# Patient Record
Sex: Male | Born: 1945 | Race: White | Hispanic: No | Marital: Married | Smoking: Never smoker
Health system: Southern US, Community
[De-identification: ages and names within clinical notes are randomized; demographics above are authoritative.]

## PROBLEM LIST (undated history)

## (undated) DIAGNOSIS — G4730 Sleep apnea, unspecified: Secondary | ICD-10-CM

## (undated) DIAGNOSIS — Z85828 Personal history of other malignant neoplasm of skin: Secondary | ICD-10-CM

## (undated) DIAGNOSIS — R61 Generalized hyperhidrosis: Secondary | ICD-10-CM

## (undated) DIAGNOSIS — R011 Cardiac murmur, unspecified: Secondary | ICD-10-CM

## (undated) DIAGNOSIS — Z87442 Personal history of urinary calculi: Secondary | ICD-10-CM

## (undated) DIAGNOSIS — I10 Essential (primary) hypertension: Secondary | ICD-10-CM

## (undated) DIAGNOSIS — M1990 Unspecified osteoarthritis, unspecified site: Secondary | ICD-10-CM

## (undated) DIAGNOSIS — C801 Malignant (primary) neoplasm, unspecified: Secondary | ICD-10-CM

## (undated) HISTORY — PX: BACK SURGERY: SHX140

## (undated) HISTORY — PX: TONSILLECTOMY: SUR1361

## (undated) HISTORY — PX: EYE SURGERY: SHX253

---

## 1998-05-30 HISTORY — PX: TENDON REPAIR: SHX5111

## 1999-01-03 ENCOUNTER — Ambulatory Visit: Admission: RE | Admit: 1999-01-03 | Discharge: 1999-01-03 | Payer: Self-pay | Admitting: Family Medicine

## 2003-01-10 ENCOUNTER — Encounter: Payer: Self-pay | Admitting: Orthopedic Surgery

## 2003-01-15 ENCOUNTER — Encounter (INDEPENDENT_AMBULATORY_CARE_PROVIDER_SITE_OTHER): Payer: Self-pay | Admitting: Specialist

## 2003-01-15 ENCOUNTER — Encounter: Payer: Self-pay | Admitting: Orthopedic Surgery

## 2003-01-15 ENCOUNTER — Observation Stay (HOSPITAL_COMMUNITY): Admission: RE | Admit: 2003-01-15 | Discharge: 2003-01-16 | Payer: Self-pay | Admitting: Orthopedic Surgery

## 2006-05-30 HISTORY — PX: WRIST SURGERY: SHX841

## 2007-11-01 ENCOUNTER — Encounter (INDEPENDENT_AMBULATORY_CARE_PROVIDER_SITE_OTHER): Payer: Self-pay | Admitting: Orthopedic Surgery

## 2007-11-01 ENCOUNTER — Inpatient Hospital Stay (HOSPITAL_COMMUNITY): Admission: RE | Admit: 2007-11-01 | Discharge: 2007-11-02 | Payer: Self-pay | Admitting: Orthopedic Surgery

## 2007-11-11 ENCOUNTER — Emergency Department (HOSPITAL_COMMUNITY): Admission: EM | Admit: 2007-11-11 | Discharge: 2007-11-11 | Payer: Self-pay | Admitting: Emergency Medicine

## 2009-01-31 IMAGING — CT CT PELVIS W/O CM
2 of 4 series · 17 of 46 positions shown, 19 images · non-contrast
Comparison: Plain films 11/01/2007. Chest films 10/29/2007.

 CT ABDOMEN

11/12/07 – DUPLICATE COPY for exam association in [facility] – No change from original report.
CLINICAL DATA: RIGHT FLANK PAIN. RECENT LUMBAR SPINE SURGERY.

 CT ABDOMEN AND PELVIS WITHOUT CONTRAST (CT UROGRAM)
TECHNIQUE: Contiguous axial images of the abdomen and pelvis
 without oral or intravenous contrast were obtained.

[Series 2: stone_wo 5.0 b40f st · axial · 0.87mm/px · z∈[-330,+54]mm · 14 of 106 slices shown, 16 images]
[im 5/106  soft-tissue]
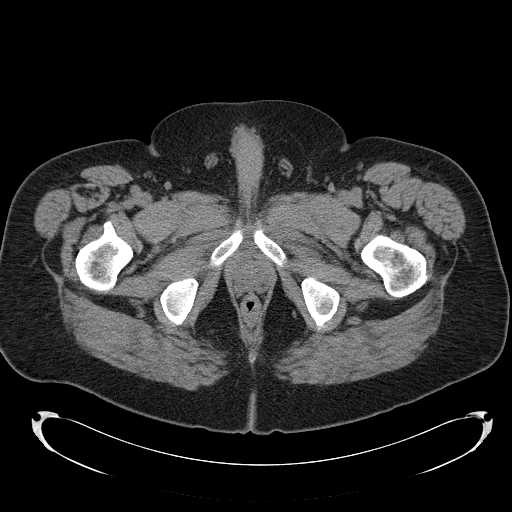
[im 5/106  bone]
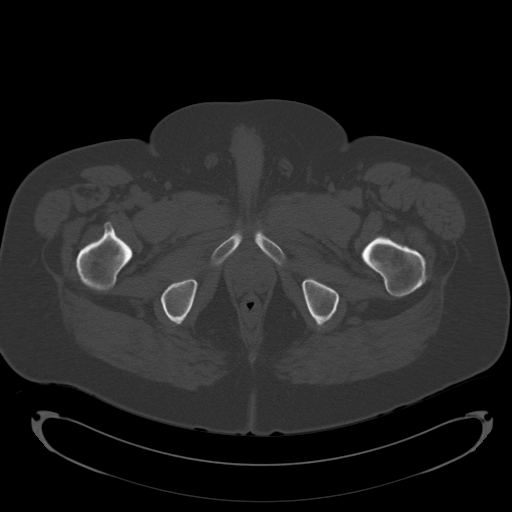
[im 13/106  soft-tissue]
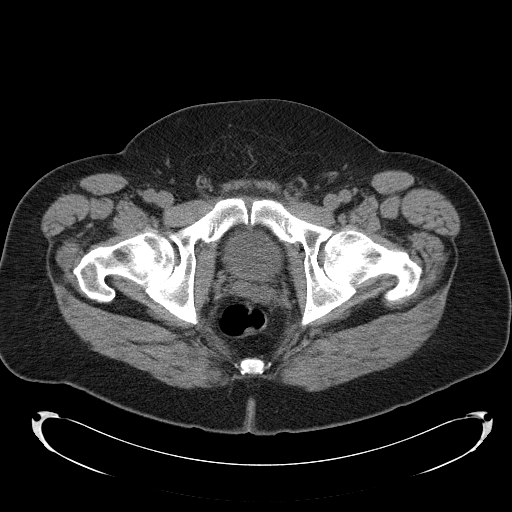
[im 22/106  soft-tissue]
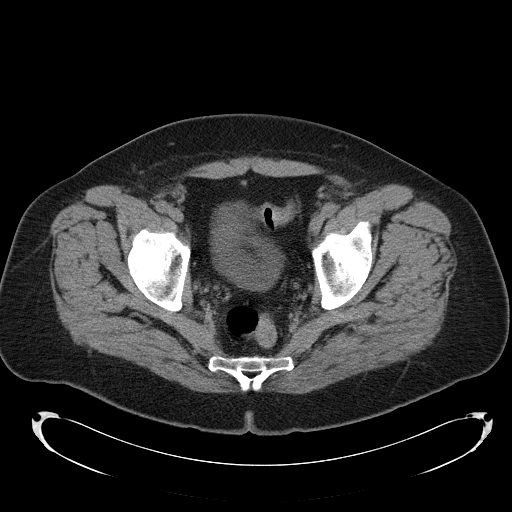
[im 30/106  soft-tissue]
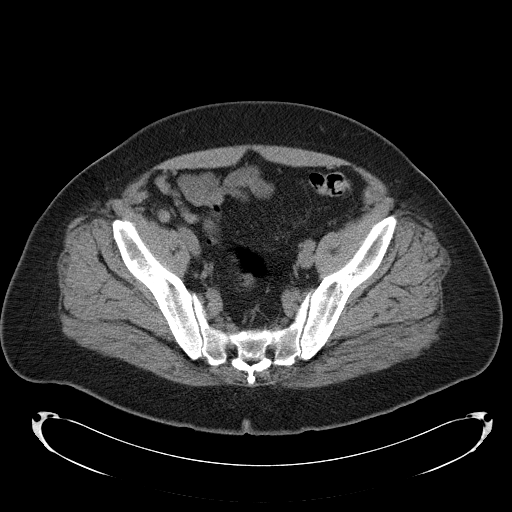
[im 34/106  soft-tissue]
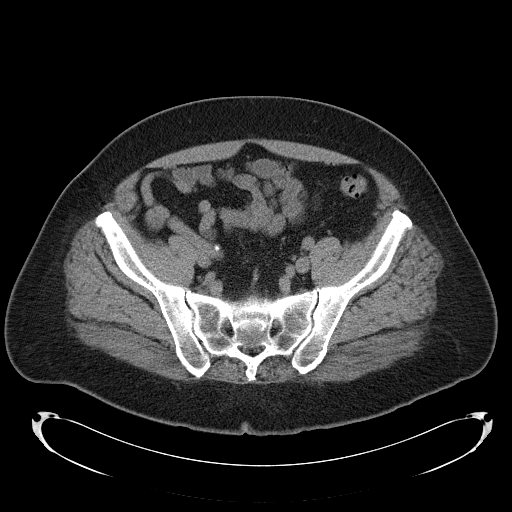
[im 43/106  soft-tissue]
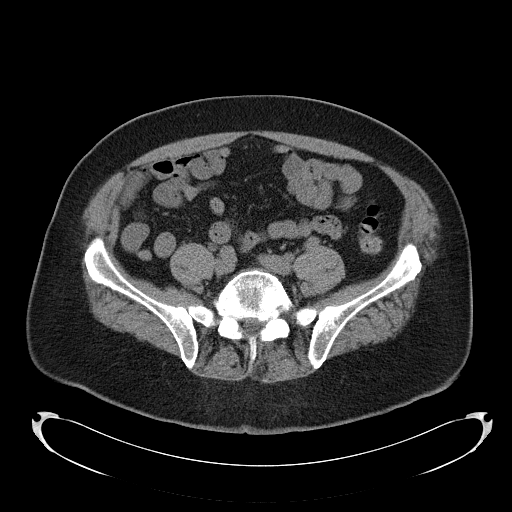
[im 51/106  soft-tissue]
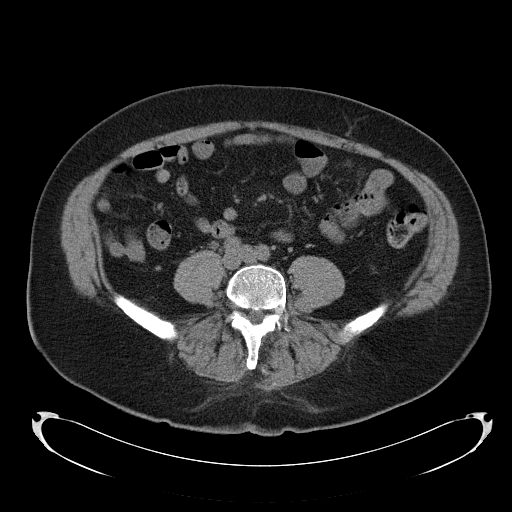
[im 55/106  soft-tissue]
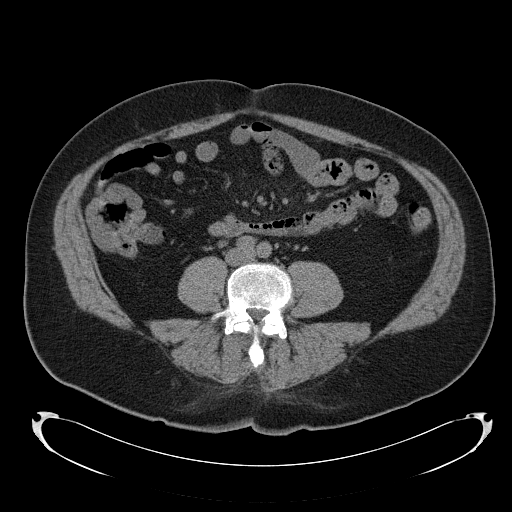
[im 64/106  soft-tissue]
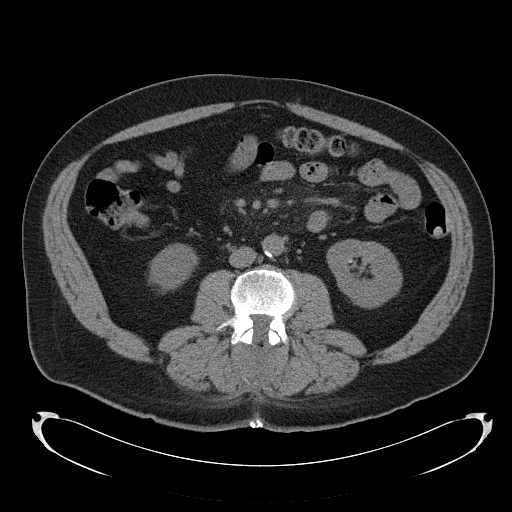
[im 64/106  bone]
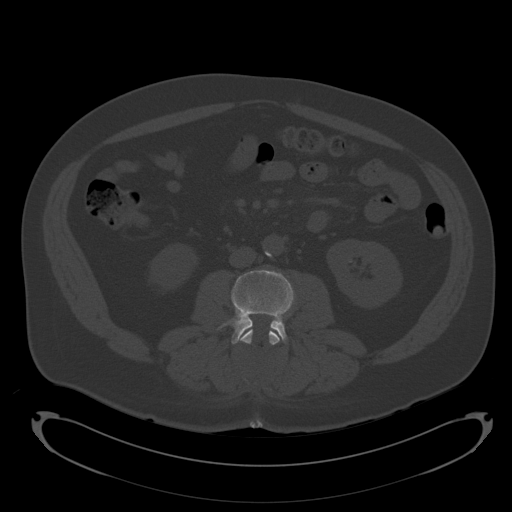
[im 72/106  soft-tissue]
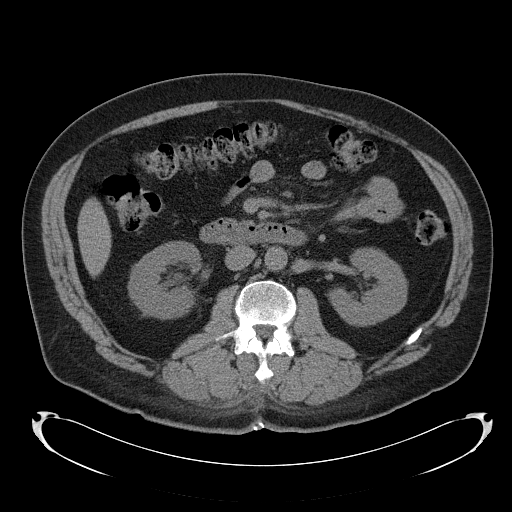
[im 80/106  soft-tissue]
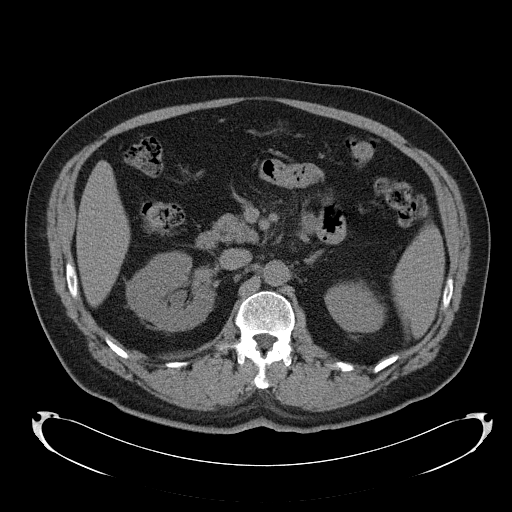
[im 85/106  soft-tissue]
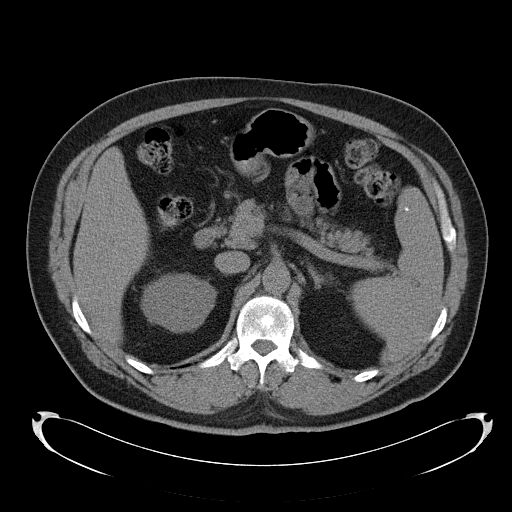
[im 93/106  soft-tissue]
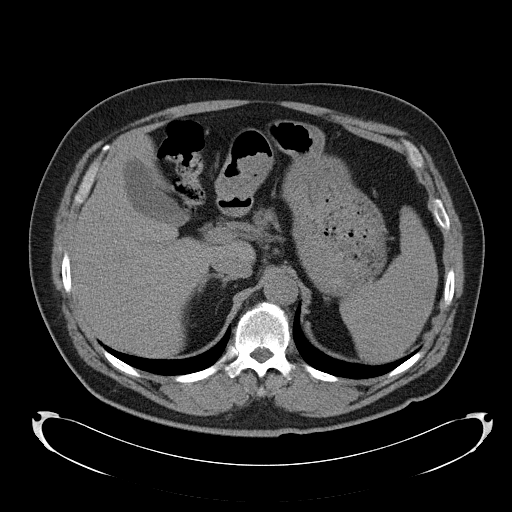
[im 101/106  soft-tissue]
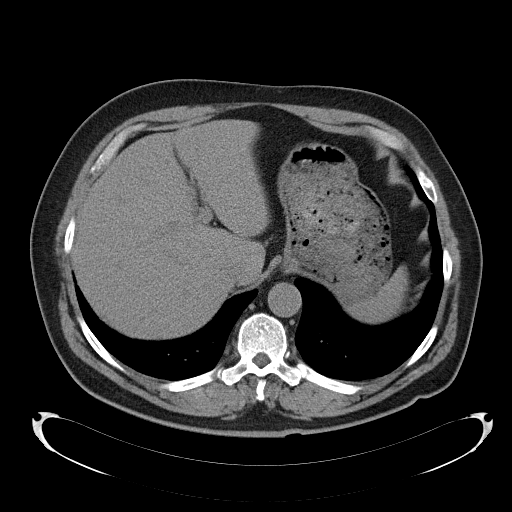

[Series 602: coronal · coronal · 0.87mm/px · 3 of 82 slices shown]
[im 28/82  soft-tissue]
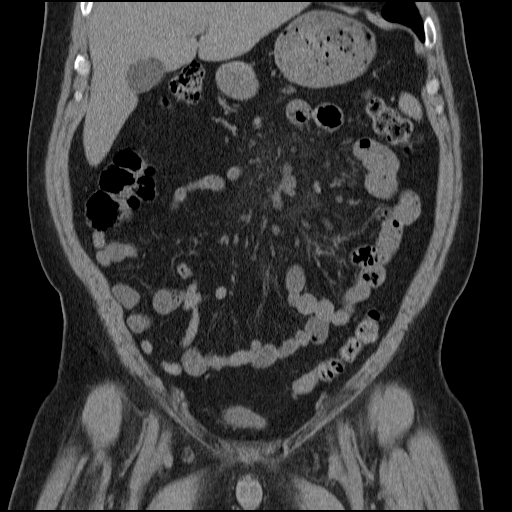
[im 37/82  soft-tissue]
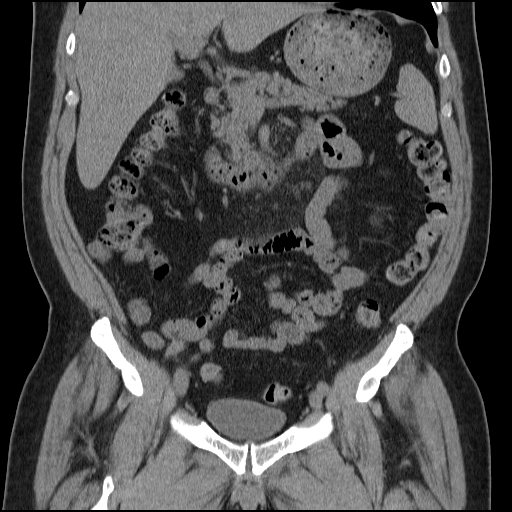
[im 46/82  soft-tissue]
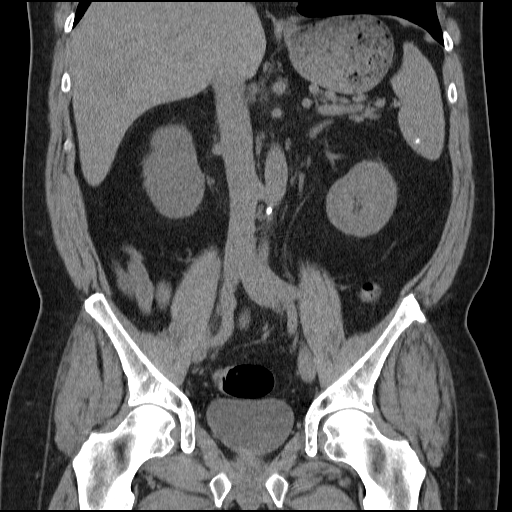

[17 of 46 positions shown; findings below may reference images not displayed]

FINDINGS: Exam is limited for evaluation of entities other than
 urinary tract calculi due to lack of oral or intravenous contrast.

 Clear lung bases. No pleural effusion. Normal visualized
 portions of the liver. Old granulomas disease in the spleen.
 Normal stomach, pancreas, gallbladder, biliary tract, adrenal
 glands.

 Lower pole left renal calculus without hydronephrosis. Mild
 obstructive signs involving the right kidney and proximal right
 ureter. These continue to the level of a 4 mm mid right ureteric
 calculus on axial image 73. Retroaortic left renal vein which is a
 normal anatomic variant. No retroperitoneal or retrocrural
 adenopathy. Normal colon, appendix, and terminal ileum.

 Increased fat within the small bowel mesentery with increased
 number of small nodes. Image 37. Small bowel loops otherwise
 normal without ascites.
IMPRESSION: 1. 4 mm mid to distal right ureteric calculus with mild proximal
 obstructive signs.
 2. Nonobstructive punctate left renal calculus.
 3. Mesenteric edema increased number of small lymph nodes.
 Findings are suspicious for incidental mesenteric adenitis.

 CT PELVIS
FINDINGS: Normal pelvic bowel loops. No distal urinary tract
 stones. No pelvic adenopathy or ascites. Normal urinary bladder
 and prostate. Edema posterior to the lumbar spine is likely
 postoperative. No evidence of abscess. Degenerative disc disease
 at L5-S1
IMPRESSION: 1. No acute pelvic process.

## 2010-10-12 NOTE — H&P (Signed)
Jeffrey Andrews, [name]                 ACCOUNT NO.:  [id number]   MEDICAL RECORD NO.:  [id number]          PATIENT TYPE:  INP   LOCATION:  NA                           FACILITY:  [facility]   PHYSICIAN:  [name], M.D.  DATE OF BIRTH:  04/13/1946   DATE OF ADMISSION:  11/01/2007  DATE OF DISCHARGE:                              HISTORY & PHYSICAL   CHIEF COMPLAINT:  Pain in my back and legs.   PRESENT ILLNESS:  This is a 65 year old white male seen by Dr. Simonne Come  for continuing progressive problems concerning pain in his lumbar spine  and into the lower extremities.  He has had 2 previous surgeries of the  lumbar spine and had done fairly well after those but recently has had  increasing pain into his back.  He has had knee surgeries and has done  well with those but continues with back pain with radiation into the  lower extremities.  MRI has shown spinal stenosis from L2 to S1 with a  herniated nucleus pulposus at L2-L3 on the left.  The patient has  negative straight raise bilaterally.  Has tenderness to palpation of the  lumbar spine, particularly with extension increasing his discomfort and  standing for long periods of time.  Risks and benefits have been  discussed with him concerning this surgical procedure, and he is being  admitted for central and foraminal decompressive lumbar laminectomy from  L2 to S1 with microdiskectomy at L2-L3 on the left.   PAST MEDICAL HISTORY:  This gentleman has been in relatively good health  throughout his lifetime.  Dr. Johny Blamer of [place] [facility]  [facility] is his family physician.  He is allergic to no  medication, food, latex or metal.   CURRENT MEDICATIONS:  Maxzide daily, Celebrex daily (stopped taking this  about a week ago),  Lyrica b.i.d., Ultram for pain and Crestor daily.   PREVIOUS SURGERIES:  He has 2 lumbar laminectomies, left wrist surgery,  left quadriceps tendon reattachment.   He is being treated for  hypertension.   SOCIAL HISTORY:  The patient is married.  No current intake of tobacco  products.  No alcohol intake.  His wife will be caregiver after surgery.   REVIEW OF SYSTEMS:  CNS:  No seizure disorder, numbness or double  vision.  RESPIRATORY:  No productive cough, no hemoptysis, no shortness  of breath.  CARDIOVASCULAR:  No chest pain, no angina, no  orthopnea.  GASTROINTESTINAL:  No nausea, vomiting, melena or  bloody stools.  GENITOURINARY:  No discharge, dysuria or hematuria.  MUSCULOSKELETAL:  Primarily in present illness.   PHYSICAL EXAMINATION:  GENERAL:  This is an alert and cooperative,  friendly 65 year old white male who is 250 pounds and is 5 foot 8  inches.  HEENT:  Normocephalic.  PERRLA.  Oropharynx is clear.  CHEST:  Clear to auscultation.  No rhonchi or rales.  HEART:  Regular rate and rhythm.  There is a grade 3/6 murmur heard best  at the left sternal border.  (This is from rheumatic fever as a child).  ABDOMEN:  Exam shows obesity.  Bowel sounds present.  GENITALIA/RECTAL:  Not done, not pertinent to present illness.  EXTREMITIES:  As in present  illness above.   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   PLAN:  The patient will undergo central and foraminal decompressive  lumbar laminectomy at L2-S1 with microdiskectomy at L2-L3 on the left.  Should we have any medical problem in the hospital, we will certainly  contact Dr. Johny Blamer or one of his associates to help [place] along.      Dooley L. Cherlynn June.    ______________________________  [name], M.D.    DLU/MEDQ  D:  10/26/2007  T:  10/26/2007  Job:  [id number]   cc:   Dr. Jamey Ripa [facility]  [facility]

## 2010-10-12 NOTE — Op Note (Signed)
Jeffrey Andrews, Jeffrey Andrews                 ACCOUNT NO.:  [id number]   MEDICAL RECORD NO.:  [id number]          PATIENT TYPE:  INP   LOCATION:  0003                         FACILITY:  [facility]   PHYSICIAN:  Jeffrey Andrews, M.D.  DATE OF BIRTH:  November 05, 1945   DATE OF PROCEDURE:  11/01/2007  DATE OF DISCHARGE:                               OPERATIVE REPORT   PREOPERATIVE DIAGNOSES:  Spinal stenosis and herniated nucleus pulposus  with extruded fragment L2-3.   POSTOPERATIVE DIAGNOSES:  Spinal stenosis and herniated nucleus pulposus  with extruded fragment L2-3.   OPERATION:  Central decompressive laminectomy and microdiskectomy with  removal of a large amount of extruded disk material L2-3.   SURGEON:  Dr. Simonne Come.   ASSISTANT:  Dr. Georges Lynch. Gioffre.   ANESTHESIA:  General.   PATHOLOGY AND JUSTIFICATION FOR PROCEDURE:  He has had two prior  surgeries on his back with the last in 2004, and at L4-5.  He was  working on his roof and developed left sciatic type pain initially with  numbness in the anterior thigh.  This has evolved, so that today before  surgery he is having more in the way of right thigh pain.  He was having  no pain distal to his thighs.  An MRI on April 24, 2007, has  demonstrated spinal stenosis with a large extruded disk fragment on the  body of L3, central and somewhat to the left.   I thoroughly discussed surgical intervention with him.  With him having  no symptoms past the thigh and the changes on the MRI appearing to be  acute at L2-3 and chronic distal to that, he understands that our plan  will be to operate mainly at L2-3, and only good distally if there was  some indication at surgery that this would be appropriate.  See  operative description below for additional details.   DESCRIPTION OF PROCEDURE:  Prophylactic antibiotics, satisfactory  general anesthesia, knee-chest position on the Andrews frame, the back  was prepped with DuraPrep.  With two spinal  needles and a lateral x-ray,  I tentatively located the L2-3 disk space.  I then continued draping the  back in a sterile field.  Ioban employed.  Time-out performed.  A  vertical midline incision based on the initial film, the two spinous  processes at this level were tagged with Kocher clamps and a second  lateral x-ray taken, indicating that the Kocher clamps were on the L2  and L3.  We dissected soft tissue off the lamina of L2 and L3, placed a  self-retaining McCullough retractor.  With double-action rongeur, I  performed central decompression, removing a good bit of the inferior  spinous process of L2 and superior of L3.  After removing a good bit of  extra bone with the double-action rongeur, I then brought in the  microscope and we completed extensive decompression.  He did have  significant spinal stenosis.  The L3 nerve roots were extremely  sensitive bilaterally, more so on the right, and anatomically it proved  easier to work out wide laterally on the right  as apposed to the left.  Since this was his more symptomatic side and since we were going to be  working centrally as well, we kept working to the right side,  identifying the L3 nerve root, which we were able to mobilize medially  and there was a large disk herniation beneath it.  We opened this with a  15 knife blade and removed a large amount of extruded disk material,  going down over the body of L3 as shown on the MRI.  The disk space was  somewhat narrow.  I removed all disk material obtainable using nerve  hook, Epstein curette and micropituitary.  I then irrigated the wound  well with sterile saline and placed Gelfoam soaked thrombin in the bed  of the wound.  There was no unusual bleeding at closure.  The self-  retaining retractors were removed with additional Gelfoam placed over  the dura.  The fascia was reapproximated with interrupted #1 Vicryl as  was the deep subcutaneous tissue, superficial subcutaneous  tissue was  closed with 2-0 Vicryl and staples in the skin.  Betadine Adaptic dry  sterile dressing were applied.  He tolerated the procedure well and was  moved to his PACU bed and taken in a satisfactory condition with no  known complications.  Blood loss was no more than 250 mL.           ______________________________  Jeffrey Andrews, M.D.     JA/MEDQ  D:  11/01/2007  T:  11/01/2007  Job:  [id number]

## 2010-10-15 NOTE — Op Note (Signed)
NAME:  Jeffrey Andrews, Jeffrey Andrews                           ACCOUNT NO.:  [id number]   MEDICAL RECORD NO.:  [id number]                   PATIENT TYPE:  AMB   LOCATION:  DAY                                  FACILITY:  [facility]   PHYSICIAN:  [name], M.D.               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   OPERATION:  Microdiskectomy, L4-5 left with excision of recurrent HNP and  decompression of L5 nerve root.   SURGEON:  Illene Labrador. Aplington, M.D.   ASSISTANT:  Philips J. Montez Morita, M.D.   ANESTHESIA:  General.   PATHOLOGY AND JUSTIFICATION FOR PROCEDURE:  Original surgery was in 1983 by  one of my retired partners, Dr. Meade Maw.  He did well until roughly 6-8  months ago when he started having intermittent numbness in the lateral  aspect of his left leg with prolonged standing.  This progressed to pain.  An MRI gadolinium on December 23, 2002, demonstrated the above-mentioned  pathology.  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   DESCRIPTION OF PROCEDURE:  Prophylactic antibiotics, satisfactory general  anesthesia, knee-chest position on the Jennings frame.  Back was prepped with  DuraPrep and with a lateral x-ray and three spinal needles, I tentatively  localized the L4-5 interspace.  I then completed draping of the back in a  sterile field, Ioban employed.  Based on the initial x-ray, I made an  incision through the old scar which was well-healed, carried the incision  down to what I felt were the L4 and L5 spinous processes which I tagged with  Kocher clamps and took a second lateral x-ray, confirming that we were at L4-  5 and that the disk space  was slightly caudad to the superior clamp.  We  then continued dissecting soft tissue off the residual lamina of L4 and L5,  placed a self-retaining McCullough retractor, and then dissected out the  previous bony perimeter.  With 2 and occasionally 3 mm Kerrison rongeurs, I  then was able to undermine the lamina of L4 inferiorly and L5 superiorly and  work around the bony perimeter, working laterally, freeing up bone and  removing scar.  When we had done about as much decompression as we could  without the microscope, we brought in the scope and completed the  decompression doing a wide foraminotomy and finding significant bone  impingement on the L5 nerve root laterally with an indentation in the nerve  at the level of the superior pedicle.  After completing the freeing up, the  interspace was located, and I gently mobilized the L5  nerve root so we could  move it medially and open the disk space and with a combination of  instruments including Epstein curette, nerve hook, straight and angled  upright pituitary rongeurs, we removed all the disk material possible,  including several chunks of disk material lying right beneath the posterior  longitudinal ligament and dura.  At the conclusion, his L5 nerve root was  freely mobile, and his foramen was wide open to hockey-stick.  The L3-4  foramen was also patent.  Several potential bleeders were coagulated with  bipolar cautery.  At closure, the wound was dry.  The wound was irrigated  with sterile saline, and Gelfoam was placed around the interspace, dura, and  L5 nerve root.  The self-retaining retractors were removed.  There was no  unusual bleeding at that point.  I then continued to close the wound with  interrupted #1 Vicryl in the fascia and deep subcutaneous tissue.  The  superficial subcutaneous tissue was then infiltrated with 0.5% plain  Marcaine.  He was given 30 mg of Toradol IV.  Closure was then  completed with interrupted 2-0  Vicryl and superficial subcutaneous tissue  with staples in the skin.  Betadine, Adaptic dry sterile dressing were  applied.  He tolerated the procedure well and was gently moved on the PACU  bed and taken to the recovery room in satisfactory condition with no known  complications.                                               [name], M.D.    JA/MEDQ  D:  01/15/2003  T:  01/15/2003  Job:  [id number]

## 2011-02-24 LAB — URINALYSIS, ROUTINE W REFLEX MICROSCOPIC
Bilirubin Urine: NEGATIVE
Ketones, ur: NEGATIVE
Leukocytes, UA: NEGATIVE
Nitrite: NEGATIVE
Specific Gravity, Urine: 1.021
Urobilinogen, UA: 1
pH: 6

## 2011-02-24 LAB — BASIC METABOLIC PANEL
CO2: 31
Chloride: 99
GFR calc Af Amer: >60
Potassium: 3.2 — ABNORMAL LOW
Sodium: 139

## 2011-02-24 LAB — URINE MICROSCOPIC-ADD ON

## 2011-02-24 LAB — HEMOGLOBIN AND HEMATOCRIT, BLOOD
HCT: 44.5
Hemoglobin: 15.7

## 2011-11-15 ENCOUNTER — Ambulatory Visit (INDEPENDENT_AMBULATORY_CARE_PROVIDER_SITE_OTHER): Payer: Medicare Other | Admitting: Internal Medicine

## 2011-11-15 VITALS — BP 170/84 | HR 60 | Temp 98.1°F | Resp 16 | Ht 67.5 in | Wt 240.0 lb

## 2011-11-15 DIAGNOSIS — Z23 Encounter for immunization: Secondary | ICD-10-CM

## 2011-11-15 DIAGNOSIS — S61409A Unspecified open wound of unspecified hand, initial encounter: Secondary | ICD-10-CM

## 2011-11-15 DIAGNOSIS — M25549 Pain in joints of unspecified hand: Secondary | ICD-10-CM

## 2011-11-15 MED ORDER — TETANUS-DIPHTH-ACELL PERTUSSIS 5-2.5-18.5 LF-MCG/0.5 IM SUSP
0.5000 mL | Freq: Once | INTRAMUSCULAR | Status: AC
  Administered 2011-11-15: 0.5 mL via INTRAMUSCULAR

## 2011-11-15 NOTE — Progress Notes (Signed)
   Patient ID: Jeffrey Andrews MRN: [id number], DOB: [birth year], 67 y.o. Date of Encounter: 11/15/2011, 9:38 AM   PROCEDURE NOTE: Verbal consent obtained. Sterile technique employed. Numbing: Local anesthesia obtained with 2% lidocaine plain  Cleansed with soap and water. Irrigated.   Wound explored, no deep structures involved, no foreign bodies.   Wound repaired with # 6 SI with 4.0 ethilon Hemostasis obtained. Wound cleansed and dressed.  Wound care instructions including precautions covered with patient. Handout given.  Anticipate suture removal in 7-10 days  Rhoderick Moody, PA-C 11/15/2011 9:38 AM

## 2011-11-15 NOTE — Patient Instructions (Addendum)

## 2011-11-15 NOTE — Progress Notes (Signed)
=   Present illness Failing hard falling into a bush and the side of the birdhouse scraping his hand and banging his knee and shin Laceration hand-right He is in need of tetanus   Exam  4 cm scrape abrasionWith laceration along the palmar aspect of the fifth metacarpal to the MCP   Impression wound hand  Tdap Wound care Suture removal 7 days

## 2011-11-24 ENCOUNTER — Ambulatory Visit (INDEPENDENT_AMBULATORY_CARE_PROVIDER_SITE_OTHER): Payer: Medicare Other | Admitting: Family Medicine

## 2011-11-24 VITALS — BP 162/87 | HR 66 | Temp 98.2°F | Resp 16 | Ht 67.5 in | Wt 242.6 lb

## 2011-11-24 DIAGNOSIS — S61409A Unspecified open wound of unspecified hand, initial encounter: Secondary | ICD-10-CM

## 2011-11-24 NOTE — Progress Notes (Signed)
Subjective Would is not giving him any trouble. He says or 7 stitches originally. At least one is come out.  Objective: For stitches seen and removed without difficulties  Wound hand  Keep a Band-Aid on it return if worse acute

## 2012-05-05 ENCOUNTER — Emergency Department (HOSPITAL_COMMUNITY)
Admission: EM | Admit: 2012-05-05 | Discharge: 2012-05-05 | Disposition: A | Discharge status: Home / Self Care | Payer: Medicare Other | Attending: Emergency Medicine | Admitting: Emergency Medicine

## 2012-05-05 ENCOUNTER — Telehealth (HOSPITAL_COMMUNITY): Payer: Self-pay | Admitting: Emergency Medicine

## 2012-05-05 ENCOUNTER — Emergency Department (HOSPITAL_COMMUNITY): Payer: Medicare Other

## 2012-05-05 ENCOUNTER — Encounter (HOSPITAL_COMMUNITY): Payer: Self-pay | Admitting: *Deleted

## 2012-05-05 DIAGNOSIS — L748 Other eccrine sweat disorders: Secondary | ICD-10-CM | POA: Insufficient documentation

## 2012-05-05 DIAGNOSIS — S43409A Unspecified sprain of unspecified shoulder joint, initial encounter: Secondary | ICD-10-CM

## 2012-05-05 DIAGNOSIS — IMO0002 Reserved for concepts with insufficient information to code with codable children: Secondary | ICD-10-CM | POA: Insufficient documentation

## 2012-05-05 DIAGNOSIS — Y939 Activity, unspecified: Secondary | ICD-10-CM | POA: Insufficient documentation

## 2012-05-05 DIAGNOSIS — W11XXXA Fall on and from ladder, initial encounter: Secondary | ICD-10-CM | POA: Insufficient documentation

## 2012-05-05 DIAGNOSIS — Y929 Unspecified place or not applicable: Secondary | ICD-10-CM | POA: Insufficient documentation

## 2012-05-05 DIAGNOSIS — S0081XA Abrasion of other part of head, initial encounter: Secondary | ICD-10-CM

## 2012-05-05 DIAGNOSIS — Z791 Long term (current) use of non-steroidal anti-inflammatories (NSAID): Secondary | ICD-10-CM | POA: Insufficient documentation

## 2012-05-05 DIAGNOSIS — Z79899 Other long term (current) drug therapy: Secondary | ICD-10-CM | POA: Insufficient documentation

## 2012-05-05 HISTORY — DX: Generalized hyperhidrosis: R61

## 2012-05-05 MED ORDER — METHOCARBAMOL 750 MG PO TABS: 750.0000 mg | ORAL_TABLET | Freq: Four times a day (QID) | ORAL | Status: DC

## 2012-05-05 MED ORDER — OXYCODONE-ACETAMINOPHEN 5-325 MG PO TABS: 2.0000 | ORAL_TABLET | ORAL | Status: DC | PRN

## 2012-05-05 NOTE — ED Provider Notes (Signed)
History     CSN: [id number]  Arrival date & time 05/05/12  6295   First MD Initiated Contact with Patient 05/05/12 [phone]      Chief Complaint  Patient presents with  . Fall  . Extremity Laceration  . Facial Pain    (Consider location/radiation/quality/duration/timing/severity/associated sxs/prior treatment) Patient is a 66 y.o. male presenting with fall. The history is provided by the patient.  Fall   patient here after falling from a ladder 3-4 feet and onto a deck. No loss of consciousness. Complains of abrasion to his nose and right forearm. Also complains of pain to his right shoulder and right elbow. Patient also complains of pain to his right anterior chest. Denies any dyspnea. No neck pain. No blurred vision. No confusion. Denies any pain below the waist. No abdominal pain. Patient placed a bandage on his nose and presents  Past Medical History  Diagnosis Date  . Excessive sweating     Past Surgical History  Procedure Date  . Back surgery     No family history on file.  History  Substance Use Topics  . Smoking status: Never Smoker   . Smokeless tobacco: Not on file  . Alcohol Use: No      Review of Systems  All other systems reviewed and are negative.    Allergies  Review of patient's allergies indicates no known allergies.  Home Medications   Current Outpatient Rx  Name  Route  Sig  Dispense  Refill  . HYDROCHLOROTHIAZIDE 25 MG PO TABS   Oral   Take 12.5 mg by mouth daily. Takes 1/2 tablet         . ADULT MULTIVITAMIN W/MINERALS CH   Oral   Take 1 tablet by mouth daily.         Marland Kitchen NAPROXEN SODIUM 220 MG PO TABS   Oral   Take 440 mg by mouth 2 (two) times daily with a meal.         . SIMVASTATIN 40 MG PO TABS   Oral   Take 20 mg by mouth daily. Takes 1/2 tablet           BP 135/81  Pulse 82  Temp 98.6 F (37 C) (Oral)  Resp 18  SpO2 94%  Physical Exam  Nursing note and vitals reviewed. Constitutional: He is oriented to  person, place, and time. He appears well-developed and well-nourished.  Non-toxic appearance. No distress.  HENT:  Head: Normocephalic and atraumatic.       Nasal abrasions  Eyes: Conjunctivae normal, EOM and lids are normal. Pupils are equal, round, and reactive to light.  Neck: Normal range of motion. Neck supple. No tracheal deviation present. No mass present.  Cardiovascular: Normal rate, regular rhythm and normal heart sounds.  Exam reveals no gallop.   No murmur heard. Pulmonary/Chest: Effort normal and breath sounds normal. No stridor. No respiratory distress. He has no decreased breath sounds. He has no wheezes. He has no rhonchi. He has no rales.  Abdominal: Soft. Normal appearance and bowel sounds are normal. He exhibits no distension. There is no tenderness. There is no rebound and no CVA tenderness.  Musculoskeletal: Normal range of motion. He exhibits no edema and no tenderness.       Right forearm abrasion and right elbow swelling with limited range of motion, right shoulder with pain with ROM, no deformity, sensation intact  Neurological: He is alert and oriented to person, place, and time. He has normal strength. No cranial  nerve deficit or sensory deficit. GCS eye subscore is 4. GCS verbal subscore is 5. GCS motor subscore is 6.  Skin: Skin is warm and dry. No abrasion and no rash noted.  Psychiatric: He has a normal mood and affect. His speech is normal and behavior is normal.    ED Course  Procedures (including critical care time)  Labs Reviewed - No data to display No results found.   No diagnosis found.    MDM  Patient does not have any suturable wounds. They were dressed with topical antibiotic ointment. Slight shortness placed in a sling by nursing staff he was given orthopedic referral for a likely a.c. joint injury.        Toy Baker, MD 05/05/12 1028

## 2012-05-05 NOTE — ED Notes (Signed)
Patient transported to X-ray 

## 2012-05-05 NOTE — ED Notes (Signed)
Pt reports ladder slipping out from under him and falling 3-4 ft. C/o nose pain, R shoulder pain with limited ROM and R arm lac, bleeding controlled.

## 2012-05-30 DIAGNOSIS — C801 Malignant (primary) neoplasm, unspecified: Secondary | ICD-10-CM

## 2012-05-30 HISTORY — DX: Malignant (primary) neoplasm, unspecified: C80.1

## 2013-04-28 ENCOUNTER — Encounter (HOSPITAL_COMMUNITY): Payer: Self-pay | Admitting: Emergency Medicine

## 2013-04-28 ENCOUNTER — Emergency Department (HOSPITAL_COMMUNITY)
Admission: EM | Admit: 2013-04-28 | Discharge: 2013-04-28 | Disposition: A | Discharge status: Home / Self Care | Payer: Medicare Other | Attending: Emergency Medicine | Admitting: Emergency Medicine

## 2013-04-28 DIAGNOSIS — S0191XA Laceration without foreign body of unspecified part of head, initial encounter: Secondary | ICD-10-CM

## 2013-04-28 DIAGNOSIS — IMO0002 Reserved for concepts with insufficient information to code with codable children: Secondary | ICD-10-CM | POA: Insufficient documentation

## 2013-04-28 DIAGNOSIS — Y929 Unspecified place or not applicable: Secondary | ICD-10-CM | POA: Insufficient documentation

## 2013-04-28 DIAGNOSIS — Y9389 Activity, other specified: Secondary | ICD-10-CM | POA: Insufficient documentation

## 2013-04-28 DIAGNOSIS — S0100XA Unspecified open wound of scalp, initial encounter: Secondary | ICD-10-CM | POA: Insufficient documentation

## 2013-04-28 DIAGNOSIS — Z7982 Long term (current) use of aspirin: Secondary | ICD-10-CM | POA: Insufficient documentation

## 2013-04-28 DIAGNOSIS — Z79899 Other long term (current) drug therapy: Secondary | ICD-10-CM | POA: Insufficient documentation

## 2013-04-28 MED ORDER — LIDOCAINE-EPINEPHRINE 2 %-1:100000 IJ SOLN
20.0000 mL | Freq: Once | INTRAMUSCULAR | Status: AC
  Administered 2013-04-28: 20 mL
  Filled 2013-04-28: qty 1

## 2013-04-28 NOTE — ED Notes (Signed)
EDPA Josh present evaluation pt before this Clinical research associate. Pt reports accidental contact with duct work U SHAPED LACERATION to top portion of head bleeding controlled. Pt denies NO LOC denies neck and back pain

## 2013-04-28 NOTE — ED Provider Notes (Signed)
Medical screening examination/treatment/procedure(s) were conducted as a shared visit with non-physician practitioner(s) and myself.  I personally evaluated the patient during the encounter.  EKG Interpretation   None       66yo M with history of hypertension, hyperlipidemia who had a head injury greater than 6 hours ago. Patient reports that he stood up and hit his head on a duct and sustained a small laceration to this area. No loss of consciousness. No numbness, tingling or focal weakness. He is on anticoagulation. Patient is neurologically intact. Wound has been repaired. His tetanus is up-to-date.  Layla Maw Ward, DO 04/28/13 1946

## 2013-04-28 NOTE — ED Provider Notes (Signed)
CSN: [id number]     Arrival date & time 04/28/13  1856 History   First MD Initiated Contact with Patient 04/28/13 1902     Chief Complaint  Patient presents with  . Head Laceration   (Consider location/radiation/quality/duration/timing/severity/associated sxs/prior Treatment) HPI Comments: Patient presents with complaint of scalp laceration. Patient sustained a laceration at approximately 1 PM when he stood up striking his head on a metal duct. Wound initially bled and was cleaned with water and pressure was applied. Patient denies loss of consciousness or headache. No blurry vision, vomiting, weakness, numbness, or tingling in his extremities. No confusion or difficulty with ambulation. No other treatments prior to arrival. Patient does not take any blood thinning medicines other than 81 mg aspirin tablet. Tetanus is up-to-date. He denies neck pain. The onset of this condition was acute. The course is constant. Aggravating factors: none. Alleviating factors: none.    Patient is a 67 y.o. male presenting with scalp laceration. The history is provided by the patient.  Head Laceration Pertinent negatives include no chest pain, fatigue, headaches, nausea, neck pain, numbness, vomiting or weakness.    Past Medical History  Diagnosis Date  . Excessive sweating    Past Surgical History  Procedure Laterality Date  . Back surgery     No family history on file. History  Substance Use Topics  . Smoking status: Never Smoker   . Smokeless tobacco: Not on file  . Alcohol Use: No    Review of Systems  Constitutional: Negative for fatigue.  HENT: Negative for tinnitus.   Eyes: Negative for photophobia, pain and visual disturbance.  Respiratory: Negative for shortness of breath.   Cardiovascular: Negative for chest pain.  Gastrointestinal: Negative for nausea and vomiting.  Musculoskeletal: Negative for back pain, gait problem and neck pain.  Skin: Positive for wound.  Neurological:  Negative for dizziness, weakness, light-headedness, numbness and headaches.  Psychiatric/Behavioral: Negative for confusion and decreased concentration.    Allergies  Review of patient's allergies indicates no known allergies.  Home Medications   Current Outpatient Rx  Name  Route  Sig  Dispense  Refill  . aspirin 81 MG tablet   Oral   Take 81 mg by mouth daily.         Marland Kitchen gabapentin (NEURONTIN) 300 MG capsule   Oral   Take 300 mg by mouth at bedtime.         . hydrochlorothiazide (HYDRODIURIL) 25 MG tablet   Oral   Take 12.5 mg by mouth daily. Takes 1/2 tablet         . simvastatin (ZOCOR) 40 MG tablet   Oral   Take 20 mg by mouth daily. Takes 1/2 tablet          BP 158/91  Pulse 99  Temp(Src) 98.2 F (36.8 C) (Oral)  Resp 12  SpO2 97% Physical Exam  Nursing note and vitals reviewed. Constitutional: He is oriented to person, place, and time. He appears well-developed and well-nourished.  HENT:  Head: Normocephalic. Head is without raccoon's eyes and without Battle's sign.  Right Ear: Tympanic membrane, external ear and ear canal normal. No hemotympanum.  Left Ear: Tympanic membrane, external ear and ear canal normal. No hemotympanum.  Nose: Nose normal. No nasal septal hematoma.  Mouth/Throat: Oropharynx is clear and moist.  Eyes: Conjunctivae, EOM and lids are normal. Pupils are equal, round, and reactive to light.  No visible hyphema  Neck: Normal range of motion. Neck supple.  Cardiovascular: Normal rate and regular  rhythm.   Pulmonary/Chest: Effort normal and breath sounds normal.  Abdominal: Soft. There is no tenderness.  Musculoskeletal: Normal range of motion.       Cervical back: He exhibits normal range of motion, no tenderness and no bony tenderness.       Thoracic back: He exhibits no tenderness and no bony tenderness.       Lumbar back: He exhibits no tenderness and no bony tenderness.  Neurological: He is alert and oriented to person, place,  and time. He has normal strength and normal reflexes. No cranial nerve deficit or sensory deficit. Coordination normal. GCS eye subscore is 4. GCS verbal subscore is 5. GCS motor subscore is 6.  Skin: Skin is warm and dry.  2 cm V-shaped laceration to crown of scalp. No active bleeding. Crusted blood noted. Wound is otherwise clean.   Psychiatric: He has a normal mood and affect.    ED Course  Procedures (including critical care time) Labs Review Labs Reviewed - No data to display Imaging Review No results found.  EKG Interpretation   None      7:48 PM Patient seen and examined. D/w and seen by Dr. Elesa Massed.    Vital signs reviewed and are as follows: Filed Vitals:   04/28/13 1907  BP: 158/91  Pulse: 99  Temp: 98.2 F (36.8 C)  Resp: 12   LACERATION REPAIR Performed by: Carolee Rota Authorized by: Carolee Rota Consent: Verbal consent obtained. Risks and benefits: risks, benefits and alternatives were discussed Consent given by: patient Patient identity confirmed: provided demographic data Prepped and Draped in normal sterile fashion Wound explored  Laceration Location: scalp  Laceration Length: 2cm  No Foreign Bodies seen or palpated  Anesthesia: local infiltration  Local anesthetic: lidocaine 2% with epinephrine  Anesthetic total: 2 ml  Irrigation method: skin scrub with dermal cleanser Amount of cleaning: standard  Skin closure: 5-0 Prolene  Number of sutures: 3  Technique: half-buried horizontal mattress (1) , simple interrupted (2)  Patient tolerance: Patient tolerated the procedure well with no immediate complications.  7:49 PM Patient counseled on wound care. Patient counseled on need to return or see PCP/urgent care for suture removal in 5-7 days. Patient was urged to return to the Emergency Department urgently with worsening pain, swelling, expanding erythema especially if it streaks away from the affected area, fever, or if they have any other  concerns. Patient verbalized understanding.   Patient was counseled on head injury precautions and symptoms that should indicate their return to the ED.  These include severe worsening headache, vision changes, confusion, loss of consciousness, trouble walking, nausea & vomiting, or weakness/tingling in extremities.      MDM   1. Laceration of head, initial encounter    Small lac on scalp. Minor head injury with very minor mechanism > 6 hrs ago with no s/s of closed head injury. No blood thinners other than baby aspirin. No indication for imaging.     Renne Crigler, PA-C 04/28/13 1610

## 2014-03-20 ENCOUNTER — Other Ambulatory Visit: Payer: Self-pay | Admitting: Gastroenterology

## 2014-12-14 ENCOUNTER — Emergency Department (HOSPITAL_COMMUNITY)
Admission: EM | Admit: 2014-12-14 | Discharge: 2014-12-14 | Disposition: A | Discharge status: Home / Self Care | Payer: Medicare Other | Attending: Emergency Medicine | Admitting: Emergency Medicine

## 2014-12-14 ENCOUNTER — Encounter (HOSPITAL_COMMUNITY): Payer: Self-pay

## 2014-12-14 DIAGNOSIS — Z9889 Other specified postprocedural states: Secondary | ICD-10-CM | POA: Insufficient documentation

## 2014-12-14 DIAGNOSIS — I10 Essential (primary) hypertension: Secondary | ICD-10-CM | POA: Diagnosis not present

## 2014-12-14 DIAGNOSIS — Z7982 Long term (current) use of aspirin: Secondary | ICD-10-CM | POA: Insufficient documentation

## 2014-12-14 DIAGNOSIS — R200 Anesthesia of skin: Secondary | ICD-10-CM | POA: Insufficient documentation

## 2014-12-14 DIAGNOSIS — G8929 Other chronic pain: Secondary | ICD-10-CM | POA: Diagnosis not present

## 2014-12-14 DIAGNOSIS — M545 Low back pain: Secondary | ICD-10-CM | POA: Diagnosis present

## 2014-12-14 DIAGNOSIS — Z79899 Other long term (current) drug therapy: Secondary | ICD-10-CM | POA: Insufficient documentation

## 2014-12-14 MED ORDER — OXYCODONE-ACETAMINOPHEN 5-325 MG PO TABS
1.0000 | ORAL_TABLET | Freq: Once | ORAL | Status: AC
  Administered 2014-12-14: 1 via ORAL
  Filled 2014-12-14: qty 1

## 2014-12-14 MED ORDER — METHOCARBAMOL 500 MG PO TABS: 500.0000 mg | ORAL_TABLET | Freq: Two times a day (BID) | ORAL | Status: DC | PRN

## 2014-12-14 MED ORDER — METHOCARBAMOL 500 MG PO TABS
500.0000 mg | ORAL_TABLET | Freq: Once | ORAL | Status: AC
  Administered 2014-12-14: 500 mg via ORAL
  Filled 2014-12-14: qty 1

## 2014-12-14 MED ORDER — OXYCODONE-ACETAMINOPHEN 5-325 MG PO TABS: 1.0000 | ORAL_TABLET | ORAL | Status: DC | PRN

## 2014-12-14 NOTE — Discharge Instructions (Signed)
Please take your blood pressure medicine when you get home today.  Back Exercises Back exercises help treat and prevent back injuries. The goal of back exercises is to increase the strength of your abdominal and back muscles and the flexibility of your back. These exercises should be started when you no longer have back pain. Back exercises include:  Pelvic Tilt. Lie on your back with your knees bent. Tilt your pelvis until the lower part of your back is against the floor. Hold this position 5 to 10 sec and repeat 5 to 10 times.  Knee to Chest. Pull first 1 knee up against your chest and hold for 20 to 30 seconds, repeat this with the other knee, and then both knees. This may be done with the other leg straight or bent, whichever feels better.  Sit-Ups or Curl-Ups. Bend your knees 90 degrees. Start with tilting your pelvis, and do a partial, slow sit-up, lifting your trunk only 30 to 45 degrees off the floor. Take at least 2 to 3 seconds for each sit-up. Do not do sit-ups with your knees out straight. If partial sit-ups are difficult, simply do the above but with only tightening your abdominal muscles and holding it as directed.  Hip-Lift. Lie on your back with your knees flexed 90 degrees. Push down with your feet and shoulders as you raise your hips a couple inches off the floor; hold for 10 seconds, repeat 5 to 10 times.  Back arches. Lie on your stomach, propping yourself up on bent elbows. Slowly press on your hands, causing an arch in your low back. Repeat 3 to 5 times. Any initial stiffness and discomfort should lessen with repetition over time.  Shoulder-Lifts. Lie face down with arms beside your body. Keep hips and torso pressed to floor as you slowly lift your head and shoulders off the floor. Do not overdo your exercises, especially in the beginning. Exercises may cause you some mild back discomfort which lasts for a few minutes; however, if the pain is more severe, or lasts for more than  15 minutes, do not continue exercises until you see your caregiver. Improvement with exercise therapy for back problems is slow.  See your caregivers for assistance with developing a proper back exercise program. Document Released: 06/23/2004 Document Revised: 08/08/2011 Document Reviewed: 03/17/2011 [facility] Patient Information 2015 [place], [place]. This information is not intended to replace advice given to you by your health care provider. Make sure you discuss any questions you have with your health care provider. Back Pain, Adult Low back pain is very common. About 1 in 5 people have back pain.The cause of low back pain is rarely dangerous. The pain often gets better over time.About half of people with a sudden onset of back pain feel better in just 2 weeks. About 8 in 10 people feel better by 6 weeks.  CAUSES Some common causes of back pain include:  Strain of the muscles or ligaments supporting the spine.  Wear and tear (degeneration) of the spinal discs.  Arthritis.  Direct injury to the back. DIAGNOSIS Most of the time, the direct cause of low back pain is not known.However, back pain can be treated effectively even when the exact cause of the pain is unknown.Answering your caregiver's questions about your overall health and symptoms is one of the most accurate ways to make sure the cause of your pain is not dangerous. If your caregiver needs more information, he or she may order lab work or imaging tests (  X-rays or MRIs).However, even if imaging tests show changes in your back, this usually does not require surgery. HOME CARE INSTRUCTIONS For many people, back pain returns.Since low back pain is rarely dangerous, it is often a condition that people can learn to [facility] their own.   Remain active. It is stressful on the back to sit or stand in one place. Do not sit, drive, or stand in one place for more than 30 minutes at a time. Take short walks on level surfaces as soon as  pain allows.Try to increase the length of time you walk each day.  Do not stay in bed.Resting more than 1 or 2 days can delay your recovery.  Do not avoid exercise or work.Your body is made to move.It is not dangerous to be active, even though your back may hurt.Your back will likely heal faster if you return to being active before your pain is gone.  Pay attention to your body when you bend and lift. Many people have less discomfortwhen lifting if they bend their knees, keep the load close to their bodies,and avoid twisting. Often, the most comfortable positions are those that put less stress on your recovering back.  Find a comfortable position to sleep. Use a firm mattress and lie on your side with your knees slightly bent. If you lie on your back, put a pillow under your knees.  Only take over-the-counter or prescription medicines as directed by your caregiver. Over-the-counter medicines to reduce pain and inflammation are often the most helpful.Your caregiver may prescribe muscle relaxant drugs.These medicines help dull your pain so you can more quickly return to your normal activities and healthy exercise.  Put ice on the injured area.  Put ice in a plastic bag.  Place a towel between your skin and the bag.  Leave the ice on for 15-20 minutes, 03-04 times a day for the first 2 to 3 days. After that, ice and heat may be alternated to reduce pain and spasms.  Ask your caregiver about trying back exercises and gentle massage. This may be of some benefit.  Avoid feeling anxious or stressed.Stress increases muscle tension and can worsen back pain.It is important to recognize when you are anxious or stressed and learn ways to manage it.Exercise is a great option. SEEK MEDICAL CARE IF:  You have pain that is not relieved with rest or medicine.  You have pain that does not improve in 1 week.  You have new symptoms.  You are generally not feeling well. SEEK IMMEDIATE MEDICAL  CARE IF:   You have pain that radiates from your back into your legs.  You develop new bowel or bladder control problems.  You have unusual weakness or numbness in your arms or legs.  You develop nausea or vomiting.  You develop abdominal pain.  You feel faint. Document Released: 05/16/2005 Document Revised: 11/15/2011 Document Reviewed: 09/17/2013 [facility] Patient Information 2015 [place], [place]. This information is not intended to replace advice given to you by your health care provider. Make sure you discuss any questions you have with your health care provider. DASH Eating Plan DASH stands for "Dietary Approaches to Stop Hypertension." The DASH eating plan is a healthy eating plan that has been shown to reduce high blood pressure (hypertension). Additional health benefits may include reducing the risk of type 2 diabetes mellitus, heart disease, and stroke. The DASH eating plan may also help with weight loss. WHAT DO I NEED TO KNOW ABOUT THE DASH EATING PLAN? For the  DASH eating plan, you will follow these general guidelines:  Choose foods with a percent daily value for sodium of less than 5% (as listed on the food label).  Use salt-free seasonings or herbs instead of table salt or sea salt.  Check with your health care provider or pharmacist before using salt substitutes.  Eat lower-sodium products, often labeled as "lower sodium" or "no salt added."  Eat fresh foods.  Eat more vegetables, fruits, and low-fat dairy products.  Choose whole grains. Look for the word "whole" as the first word in the ingredient list.  Choose fish and skinless chicken or [place] more often than red meat. Limit fish, poultry, and meat to 6 oz (170 g) each day.  Limit sweets, desserts, sugars, and sugary drinks.  Choose heart-healthy fats.  Limit cheese to 1 oz (28 g) per day.  Eat more home-cooked food and less restaurant, buffet, and fast food.  Limit fried foods.  Cook foods using  methods other than frying.  Limit canned vegetables. If you do use them, rinse them well to decrease the sodium.  When eating at a restaurant, ask that your food be prepared with less salt, or no salt if possible. WHAT FOODS CAN I EAT? Seek help from a dietitian for individual calorie needs. Grains Whole grain or whole wheat bread. Brown rice. Whole grain or whole wheat pasta. Quinoa, bulgur, and whole grain cereals. Low-sodium cereals. Corn or whole wheat flour tortillas. Whole grain cornbread. Whole grain crackers. Low-sodium crackers. Vegetables Fresh or frozen vegetables (raw, steamed, roasted, or grilled). Low-sodium or reduced-sodium tomato and vegetable juices. Low-sodium or reduced-sodium tomato sauce and paste. Low-sodium or reduced-sodium canned vegetables.  Fruits All fresh, canned (in natural juice), or frozen fruits. Meat and Other Protein Products Ground beef (85% or leaner), grass-fed beef, or beef trimmed of fat. Skinless chicken or [place]. Ground chicken or [place]. Pork trimmed of fat. All fish and seafood. Eggs. Dried beans, peas, or lentils. Unsalted nuts and seeds. Unsalted canned beans. Dairy Low-fat dairy products, such as skim or 1% milk, 2% or reduced-fat cheeses, low-fat ricotta or cottage cheese, or plain low-fat yogurt. Low-sodium or reduced-sodium cheeses. Fats and Oils Tub margarines without trans fats. Light or reduced-fat mayonnaise and salad dressings (reduced sodium). Avocado. Safflower, olive, or canola oils. Natural peanut or almond butter. Other Unsalted popcorn and pretzels. The items listed above may not be a complete list of recommended foods or beverages. Contact your dietitian for more options. WHAT FOODS ARE NOT RECOMMENDED? Grains White bread. White pasta. White rice. Refined cornbread. Bagels and croissants. Crackers that contain trans fat. Vegetables Creamed or fried vegetables. Vegetables in a cheese sauce. Regular canned vegetables. Regular  canned tomato sauce and paste. Regular tomato and vegetable juices. Fruits Dried fruits. Canned fruit in light or heavy syrup. Fruit juice. Meat and Other Protein Products Fatty cuts of meat. Ribs, chicken wings, bacon, sausage, bologna, salami, chitterlings, fatback, hot dogs, bratwurst, and packaged luncheon meats. Salted nuts and seeds. Canned beans with salt. Dairy Whole or 2% milk, cream, half-and-half, and cream cheese. Whole-fat or sweetened yogurt. Full-fat cheeses or blue cheese. Nondairy creamers and whipped toppings. Processed cheese, cheese spreads, or cheese curds. Condiments Onion and garlic salt, seasoned salt, table salt, and sea salt. Canned and packaged gravies. Worcestershire sauce. Tartar sauce. Barbecue sauce. Teriyaki sauce. Soy sauce, including reduced sodium. Steak sauce. Fish sauce. Oyster sauce. Cocktail sauce. Horseradish. Ketchup and mustard. Meat flavorings and tenderizers. Bouillon cubes. Hot sauce. Tabasco sauce. Marinades. Taco  seasonings. Relishes. Fats and Oils Butter, stick margarine, lard, shortening, ghee, and bacon fat. Coconut, palm kernel, or palm oils. Regular salad dressings. Other Pickles and olives. Salted popcorn and pretzels. The items listed above may not be a complete list of foods and beverages to avoid. Contact your dietitian for more information. WHERE CAN I FIND MORE INFORMATION? National Heart, Lung, and Blood Institute: travelstabloid.com Document Released: 05/05/2011 Document Revised: 09/30/2013 Document Reviewed: 03/20/2013 [facility] Patient Information 2015 [place], [place]. This information is not intended to replace advice given to you by your health care provider. Make sure you discuss any questions you have with your health care provider. Hypertension Hypertension, commonly called high blood pressure, is when the force of blood pumping through your arteries is too strong. Your arteries are the blood vessels  that carry blood from your heart throughout your body. A blood pressure reading consists of a higher number over a lower number, such as 110/72. The higher number (systolic) is the pressure inside your arteries when your heart pumps. The lower number (diastolic) is the pressure inside your arteries when your heart relaxes. Ideally you want your blood pressure below 120/80. Hypertension forces your heart to work harder to pump blood. Your arteries may become narrow or stiff. Having hypertension puts you at risk for heart disease, stroke, and other problems.  RISK FACTORS Some risk factors for high blood pressure are controllable. Others are not.  Risk factors you cannot control include:   Race. You may be at higher risk if you are African American.  Age. Risk increases with age.  Gender. Men are at higher risk than women before age 70 years. After age 77, women are at higher risk than men. Risk factors you can control include:  Not getting enough exercise or physical activity.  Being overweight.  Getting too much fat, sugar, calories, or salt in your diet.  Drinking too much alcohol. SIGNS AND SYMPTOMS Hypertension does not usually cause signs or symptoms. Extremely high blood pressure (hypertensive crisis) may cause headache, anxiety, shortness of breath, and nosebleed. DIAGNOSIS  To check if you have hypertension, your health care provider will measure your blood pressure while you are seated, with your arm held at the level of your heart. It should be measured at least twice using the same arm. Certain conditions can cause a difference in blood pressure between your right and left arms. A blood pressure reading that is higher than normal on one occasion does not mean that you need treatment. If one blood pressure reading is high, ask your health care provider about having it checked again. TREATMENT  Treating high blood pressure includes making lifestyle changes and possibly taking  medicine. Living a healthy lifestyle can help lower high blood pressure. You may need to change some of your habits. Lifestyle changes may include:  Following the DASH diet. This diet is high in fruits, vegetables, and whole grains. It is low in salt, red meat, and added sugars.  Getting at least 2 hours of brisk physical activity every week.  Losing weight if necessary.  Not smoking.  Limiting alcoholic beverages.  Learning ways to reduce stress. If lifestyle changes are not enough to get your blood pressure under control, your health care provider may prescribe medicine. You may need to take more than one. Work closely with your health care provider to understand the risks and benefits. HOME CARE INSTRUCTIONS  Have your blood pressure rechecked as directed by your health care provider.   Take medicines  only as directed by your health care provider. Follow the directions carefully. Blood pressure medicines must be taken as prescribed. The medicine does not work as well when you skip doses. Skipping doses also puts you at risk for problems.   Do not smoke.   Monitor your blood pressure at home as directed by your health care provider. SEEK MEDICAL CARE IF:   You think you are having a reaction to medicines taken.  You have recurrent headaches or feel dizzy.  You have swelling in your ankles.  You have trouble with your vision. SEEK IMMEDIATE MEDICAL CARE IF:  You develop a severe headache or confusion.  You have unusual weakness, numbness, or feel faint.  You have severe chest or abdominal pain.  You vomit repeatedly.  You have trouble breathing. MAKE SURE YOU:   Understand these instructions.  Will watch your condition.  Will get help right away if you are not doing well or get worse. Document Released: 05/16/2005 Document Revised: 09/30/2013 Document Reviewed: 03/08/2013 [facility] Patient Information 2015 [place], [place]. This information is not intended to  replace advice given to you by your health care provider. Make sure you discuss any questions you have with your health care provider.

## 2014-12-14 NOTE — ED Provider Notes (Signed)
CSN: [id number]     Arrival date & time 12/14/14  3825 History   First MD Initiated Contact with Patient 12/14/14 [phone]     Chief Complaint  Patient presents with  . Back Pain   Jeffrey Andrews is a 69 y.o. male with a history of back pain and previous back surgeries by Dr. Shellia Carwin who presents to the ED complaining of right low back pain for the past 5 days. The patient reports he has had chronic pain there for many years and has had previous back surgeries for left sided pain many years ago. He reports this pain is the same and he has been seeing Dr. Rolena Infante from orthopedic surgery for this. Apparently Dr. Rolena Infante has recommended water therapy recently. The patient reports he has a follow-up appointment with Dr. Rolena Infante tomorrow. He reports his pain has worsened and he needs something else for pain. He reports his pain is currently at 3 out of 10 and goes up to an 8 out of 10 with walking. He reports he has taken nothing for treatment today. He reports taking ibuprofen yesterday with little relief. Patient reports he forgot to take his hydrochlorothiazide this morning for his hypertension. The patient denies fevers, chills, headaches, neck pain, loss of bladder control, loss of bowel control, saddle anesthesia, difficulty urinating, urinary symptoms, hematuria, weakness, falls or injury to his back.  (Consider location/radiation/quality/duration/timing/severity/associated sxs/prior Treatment) HPI  Past Medical History  Diagnosis Date  . Excessive sweating    Past Surgical History  Procedure Laterality Date  . Back surgery     No family history on file. History  Substance Use Topics  . Smoking status: Never Smoker   . Smokeless tobacco: Not on file  . Alcohol Use: No    Review of Systems  Constitutional: Negative for fever and chills.  HENT: Negative for sore throat.   Eyes: Negative for visual disturbance.  Respiratory: Negative for cough and shortness of breath.   Cardiovascular:  Negative for chest pain.  Gastrointestinal: Negative for nausea, vomiting, abdominal pain and blood in stool.  Genitourinary: Negative for dysuria, urgency, frequency, hematuria, flank pain, penile swelling, scrotal swelling, difficulty urinating, penile pain and testicular pain.  Musculoskeletal: Positive for back pain. Negative for neck pain.  Skin: Negative for rash and wound.  Neurological: Positive for numbness. Negative for dizziness, syncope, weakness, light-headedness and headaches.      Allergies  Review of patient's allergies indicates no known allergies.  Home Medications   Prior to Admission medications   Medication Sig Start Date End Date Taking? Authorizing Provider  aspirin 81 MG tablet Take 81 mg by mouth daily.    Historical Provider, MD  gabapentin (NEURONTIN) 300 MG capsule Take 300 mg by mouth at bedtime.    Historical Provider, MD  hydrochlorothiazide (HYDRODIURIL) 25 MG tablet Take 12.5 mg by mouth daily. Takes 1/2 tablet    Historical Provider, MD  methocarbamol (ROBAXIN) 500 MG tablet Take 1 tablet (500 mg total) by mouth 2 (two) times daily as needed for muscle spasms. 12/14/14   Waynetta Pean, PA-C  oxyCODONE-acetaminophen (PERCOCET/ROXICET) 5-325 MG per tablet Take 1-2 tablets by mouth every 4 (four) hours as needed for severe pain. 12/14/14   Waynetta Pean, PA-C  simvastatin (ZOCOR) 40 MG tablet Take 20 mg by mouth daily. Takes 1/2 tablet    Historical Provider, MD   BP 201/93 mmHg  Pulse 63  Temp(Src) 98.3 F (36.8 C) (Oral)  Resp 16  SpO2 100% Physical Exam  Constitutional:  He is oriented to person, place, and time. He appears well-developed and well-nourished. No distress.  Nontoxic appearing.  HENT:  Head: Normocephalic and atraumatic.  Mouth/Throat: Oropharynx is clear and moist. No oropharyngeal exudate.  Eyes: Conjunctivae are normal. Pupils are equal, round, and reactive to light. Right eye exhibits no discharge. Left eye exhibits no discharge.   Neck: Normal range of motion. Neck supple. No JVD present. No tracheal deviation present.  No midline neck tenderness.  Cardiovascular: Normal rate, regular rhythm, normal heart sounds and intact distal pulses.  Exam reveals no gallop and no friction rub.   No murmur heard. Bilateral radial, posterior tibialis and dorsalis pedis pulses are intact.    Pulmonary/Chest: Effort normal and breath sounds normal. No respiratory distress. He has no wheezes. He has no rales.  Abdominal: Soft. He exhibits no distension. There is no tenderness. There is no guarding.  Musculoskeletal: Normal range of motion. He exhibits tenderness. He exhibits no edema.  No midline neck tenderness. Patient's right lower back is mildly tender to palpation. No back edema, ecchymosis or erythema. The patient is able to ambulate without difficulty or assistance. Antalgic gait. Patient's strength is 5 out of 5 in his bilateral lower extremities.  Lymphadenopathy:    He has no cervical adenopathy.  Neurological: He is alert and oriented to person, place, and time. Coordination normal.  Patient's sensation is intact in his bilateral lower legs and feet. Patient is able to ambulate in the room.  Skin: Skin is warm and dry. No rash noted. He is not diaphoretic. No erythema. No pallor.  Psychiatric: He has a normal mood and affect. His behavior is normal.  Nursing note and vitals reviewed.   ED Course  Procedures (including critical care time) Labs Review Labs Reviewed - No data to display  Imaging Review No results found.   EKG Interpretation None      Filed Vitals:   12/14/14 0900  BP: 201/93  Pulse: 63  Temp: 98.3 F (36.8 C)  TempSrc: Oral  Resp: 16  SpO2: 100%     MDM   Meds given in ED:  Medications  oxyCODONE-acetaminophen (PERCOCET/ROXICET) 5-325 MG per tablet 1 tablet (not administered)  methocarbamol (ROBAXIN) tablet 500 mg (not administered)    New Prescriptions   METHOCARBAMOL (ROBAXIN) 500  MG TABLET    Take 1 tablet (500 mg total) by mouth 2 (two) times daily as needed for muscle spasms.   OXYCODONE-ACETAMINOPHEN (PERCOCET/ROXICET) 5-325 MG PER TABLET    Take 1-2 tablets by mouth every 4 (four) hours as needed for severe pain.    Final diagnoses:  Right low back pain, with sciatica presence unspecified  Essential hypertension   This is a 69 y.o. male with a history of back pain and previous back surgeries by Dr. Shellia Carwin who presents to the ED complaining of right low back pain for the past 5 days. The patient reports he has had chronic pain there for many years and has had previous back surgeries for left sided pain many years ago. He reports this pain is the same and he has been seeing Dr. Rolena Infante from orthopedic surgery for this. Patient reports he has follow-up with Dr. Rolena Infante tomorrow, but he could not wait due to worsening pain. He has taken nothing for treatment of his care today. He reports he forgot to take his hydrochlorothiazide this morning. I offered to provide him this medication ER today but he refuses. He reports he'll take this as soon as he gets  home today. He reports he does not need a refill for this prescription.  No neurological deficits and normal neuro exam.  Patient can walk but states is painful.  No loss of bowel or bladder control.  No concern for cauda equina.  No fever, night sweats, weight loss, h/o cancer, IVDU.  RICE protocol and pain medicine indicated and discussed with patient. Patient given Percocet and Robaxin in the ED. Patient will be provided with prescription for Percocet and Robaxin. I advised him to follow-up with Dr. Rolena Infante tomorrow as planned. Strict return precautions given. I advised the patient to follow-up with their primary care provider this week. I advised the patient to return to the emergency department with new or worsening symptoms or new concerns. The patient verbalized understanding and agreement with plan.      Waynetta Pean,  PA-C 12/14/14 1001  Lacretia Leigh, MD 12/16/14 1524

## 2014-12-14 NOTE — ED Notes (Signed)
He c/o awakening with right-sided low back pain and meralgia Tuesday, which persists.

## 2015-01-02 ENCOUNTER — Other Ambulatory Visit: Payer: Self-pay | Admitting: Orthopedic Surgery

## 2015-01-02 DIAGNOSIS — M5126 Other intervertebral disc displacement, lumbar region: Secondary | ICD-10-CM

## 2015-01-06 ENCOUNTER — Ambulatory Visit
Admission: RE | Admit: 2015-01-06 | Discharge: 2015-01-06 | Disposition: A | Discharge status: Home / Self Care | Payer: Medicare Other | Source: Ambulatory Visit | Attending: Orthopedic Surgery | Admitting: Orthopedic Surgery

## 2015-01-06 DIAGNOSIS — M5126 Other intervertebral disc displacement, lumbar region: Secondary | ICD-10-CM

## 2015-04-30 ENCOUNTER — Ambulatory Visit: Payer: Self-pay | Admitting: Orthopedic Surgery

## 2015-04-30 NOTE — Progress Notes (Signed)
Preoperative surgical orders have been place into the Epic hospital system for Jeffrey Andrews on 04/30/2015, 1:22 PM  by Mickel Crow for surgery on 06-07-2014.  Preop Total Knee orders including Experal, IV Tylenol, and IV Decadron as long as there are no contraindications to the above medications. Arlee Muslim, PA-C

## 2015-05-29 NOTE — Patient Instructions (Addendum)
YOUR PROCEDURE IS SCHEDULED ON :  06/07/14  REPORT TO [facility] MAIN ENTRANCE FOLLOW SIGNS TO EAST ELEVATOR - GO TO 3rd FLOOR CHECK IN AT 3 EAST NURSES STATION (SHORT STAY) AT:  9:45 AM  CALL THIS NUMBER IF YOU HAVE PROBLEMS THE MORNING OF SURGERY [phone]  REMEMBER:ONLY 1 PER PERSON MAY GO TO SHORT STAY WITH YOU TO GET READY THE MORNING OF YOUR SURGERY  DO NOT EAT FOOD OR DRINK LIQUIDS AFTER MIDNIGHT  TAKE THESE MEDICINES THE MORNING OF SURGERY: NONE  BRING C-PAP TUBING AND MASK TO HOSPITAL  YOU MAY NOT HAVE ANY METAL ON YOUR BODY INCLUDING HAIR PINS AND PIERCING'S. DO NOT WEAR JEWELRY, MAKEUP, LOTIONS, POWDERS OR PERFUMES. DO NOT WEAR NAIL POLISH. DO NOT SHAVE 48 HRS PRIOR TO SURGERY. MEN MAY SHAVE FACE AND NECK.  DO NOT [place]. [employer] IS NOT RESPONSIBLE FOR VALUABLES.  CONTACTS, DENTURES OR PARTIALS MAY NOT BE WORN TO SURGERY. LEAVE SUITCASE IN CAR. CAN BE BROUGHT TO ROOM AFTER SURGERY.  PATIENTS DISCHARGED THE DAY OF SURGERY WILL NOT BE ALLOWED TO DRIVE HOME.  PLEASE READ OVER THE FOLLOWING INSTRUCTION SHEETS _________________________________________________________________________________                                          [place] - PREPARING FOR SURGERY  Before surgery, you can play an important role.  Because skin is not sterile, your skin needs to be as free of germs as possible.  You can reduce the number of germs on your skin by washing with CHG (chlorahexidine gluconate) soap before surgery.  CHG is an antiseptic cleaner which kills germs and bonds with the skin to continue killing germs even after washing. Please DO NOT use if you have an allergy to CHG or antibacterial soaps.  If your skin becomes reddened/irritated stop using the CHG and inform your nurse when you arrive at Short Stay. Do not shave (including legs and underarms) for at least 48 hours prior to the first CHG shower.  You may shave your  face. Please follow these instructions carefully:   1.  Shower with CHG Soap the night before surgery and the  morning of Surgery.   2.  If you choose to wash your hair, wash your hair first as usual with your  normal  Shampoo.   3.  After you shampoo, rinse your hair and body thoroughly to remove the  shampoo.                                         4.  Use CHG as you would any other liquid soap.  You can apply chg directly  to the skin and wash . Gently wash with scrungie or clean wascloth    5.  Apply the CHG Soap to your body ONLY FROM THE NECK DOWN.   Do not use on open                           Wound or open sores. Avoid contact with eyes, ears mouth and genitals (private parts).                        Genitals (  private parts) with your normal soap.              6.  Wash thoroughly, paying special attention to the area where your surgery  will be performed.   7.  Thoroughly rinse your body with warm water from the neck down.   8.  DO NOT shower/wash with your normal soap after using and rinsing off  the CHG Soap .                9.  Pat yourself dry with a clean towel.             10.  Wear clean night clothes to bed after shower             11.  Place clean sheets on your bed the night of your first shower and do not  sleep with pets.  Day of Surgery : Do not apply any lotions/deodorants the morning of surgery.  Please wear clean clothes to the hospital/surgery center.  FAILURE TO FOLLOW THESE INSTRUCTIONS MAY RESULT IN THE CANCELLATION OF YOUR SURGERY    PATIENT SIGNATURE_________________________________  ______________________________________________________________________     Jeffrey Andrews  An incentive spirometer is a tool that can help keep your lungs clear and active. This tool measures how well you are filling your lungs with each breath. Taking long deep breaths may help reverse or decrease the chance of developing breathing (pulmonary) problems  (especially infection) following:  A long period of time when you are unable to move or be active. BEFORE THE PROCEDURE   If the spirometer includes an indicator to show your best effort, your nurse or respiratory therapist will set it to a desired goal.  If possible, sit up straight or lean slightly forward. Try not to slouch.  Hold the incentive spirometer in an upright position. INSTRUCTIONS FOR USE   Sit on the edge of your bed if possible, or sit up as far as you can in bed or on a chair.  Hold the incentive spirometer in an upright position.  Breathe out normally.  Place the mouthpiece in your mouth and seal your lips tightly around it.  Breathe in slowly and as deeply as possible, raising the piston or the ball toward the top of the column.  Hold your breath for 3-5 seconds or for as long as possible. Allow the piston or ball to fall to the bottom of the column.  Remove the mouthpiece from your mouth and breathe out normally.  Rest for a few seconds and repeat Steps 1 through 7 at least 10 times every 1-2 hours when you are awake. Take your time and take a few normal breaths between deep breaths.  The spirometer may include an indicator to show your best effort. Use the indicator as a goal to work toward during each repetition.  After each set of 10 deep breaths, practice coughing to be sure your lungs are clear. If you have an incision (the cut made at the time of surgery), support your incision when coughing by placing a pillow or rolled up towels firmly against it. Once you are able to get out of bed, walk around indoors and cough well. You may stop using the incentive spirometer when instructed by your caregiver.  RISKS AND COMPLICATIONS  Take your time so you do not get dizzy or light-headed.  If you are in pain, you may need to take or ask for pain medication before doing incentive spirometry. It is harder  to take a deep breath if you are having pain. AFTER  USE  Rest and breathe slowly and easily.  It can be helpful to keep track of a log of your progress. Your caregiver can provide you with a simple table to help with this. If you are using the spirometer at home, follow these instructions: [place] IF:   You are having difficultly using the spirometer.  You have trouble using the spirometer as often as instructed.  Your pain medication is not giving enough relief while using the spirometer.  You develop fever of 100.5 F (38.1 C) or higher. SEEK IMMEDIATE MEDICAL CARE IF:   You cough up bloody sputum that had not been present before.  You develop fever of 102 F (38.9 C) or greater.  You develop worsening pain at or near the incision site. MAKE SURE YOU:   Understand these instructions.  Will watch your condition.  Will get help right away if you are not doing well or get worse. Document Released: 09/26/2006 Document Revised: 08/08/2011 Document Reviewed: 11/27/2006 ExitCare Patient Information 2014 ExitCare, [place].   ________________________________________________________________________  WHAT IS A BLOOD TRANSFUSION? Blood Transfusion Information  A transfusion is the replacement of blood or some of its parts. Blood is made up of multiple cells which provide different functions.  Red blood cells carry oxygen and are used for blood loss replacement.  White blood cells fight against infection.  Platelets control bleeding.  Plasma helps clot blood.  Other blood products are available for specialized needs, such as hemophilia or other clotting disorders. BEFORE THE TRANSFUSION  Who gives blood for transfusions?   Healthy volunteers who are fully evaluated to make sure their blood is safe. This is blood bank blood. Transfusion therapy is the safest it has ever been in the practice of medicine. Before blood is taken from a donor, a complete history is taken to make sure that person has no history of diseases  nor engages in risky social behavior (examples are intravenous drug use or sexual activity with multiple partners). The donor's travel history is screened to minimize risk of transmitting infections, such as malaria. The donated blood is tested for signs of infectious diseases, such as HIV and hepatitis. The blood is then tested to be sure it is compatible with you in order to minimize the chance of a transfusion reaction. If you or a relative donates blood, this is often done in anticipation of surgery and is not appropriate for emergency situations. It takes many days to process the donated blood. RISKS AND COMPLICATIONS Although transfusion therapy is very safe and saves many lives, the main dangers of transfusion include:   Getting an infectious disease.  Developing a transfusion reaction. This is an allergic reaction to something in the blood you were given. Every precaution is taken to prevent this. The decision to have a blood transfusion has been considered carefully by your caregiver before blood is given. Blood is not given unless the benefits outweigh the risks. AFTER THE TRANSFUSION  Right after receiving a blood transfusion, you will usually feel much better and more energetic. This is especially true if your red blood cells have gotten low (anemic). The transfusion raises the level of the red blood cells which carry oxygen, and this usually causes an energy increase.  The nurse administering the transfusion will monitor you carefully for complications. HOME CARE INSTRUCTIONS  No special instructions are needed after a transfusion. You may find your energy is better. Speak with  your caregiver about any limitations on activity for underlying diseases you may have. SEEK MEDICAL CARE IF:   Your condition is not improving after your transfusion.  You develop redness or irritation at the intravenous (IV) site. SEEK IMMEDIATE MEDICAL CARE IF:  Any of the following symptoms occur over the  next 12 hours:  Shaking chills.  You have a temperature by mouth above 102 F (38.9 C), not controlled by medicine.  Chest, back, or muscle pain.  People around you feel you are not acting correctly or are confused.  Shortness of breath or difficulty breathing.  Dizziness and fainting.  You get a rash or develop hives.  You have a decrease in urine output.  Your urine turns a dark color or changes to pink, red, or brown. Any of the following symptoms occur over the next 10 days:  You have a temperature by mouth above 102 F (38.9 C), not controlled by medicine.  Shortness of breath.  Weakness after normal activity.  The white part of the eye turns yellow (jaundice).  You have a decrease in the amount of urine or are urinating less often.  Your urine turns a dark color or changes to pink, red, or brown. Document Released: 05/13/2000 Document Revised: 08/08/2011 Document Reviewed: 12/31/2007 [facility] Patient Information 2014 ExitCare, [place].  _______________________________________________________________________           _____________________________________________________________________

## 2015-06-02 ENCOUNTER — Ambulatory Visit: Payer: Self-pay | Admitting: Orthopedic Surgery

## 2015-06-02 ENCOUNTER — Encounter (HOSPITAL_COMMUNITY): Payer: Self-pay

## 2015-06-02 ENCOUNTER — Encounter (HOSPITAL_COMMUNITY)
Admission: RE | Admit: 2015-06-02 | Discharge: 2015-06-02 | Disposition: A | Discharge status: Home / Self Care | Payer: Medicare Other | Source: Ambulatory Visit | Attending: Orthopedic Surgery | Admitting: Orthopedic Surgery

## 2015-06-02 DIAGNOSIS — Z01818 Encounter for other preprocedural examination: Secondary | ICD-10-CM | POA: Diagnosis not present

## 2015-06-02 DIAGNOSIS — M1712 Unilateral primary osteoarthritis, left knee: Secondary | ICD-10-CM | POA: Diagnosis not present

## 2015-06-02 HISTORY — DX: Sleep apnea, unspecified: G47.30

## 2015-06-02 HISTORY — DX: Unspecified osteoarthritis, unspecified site: M19.90

## 2015-06-02 HISTORY — DX: Essential (primary) hypertension: I10

## 2015-06-02 HISTORY — DX: Cardiac murmur, unspecified: R01.1

## 2015-06-02 HISTORY — DX: Personal history of other malignant neoplasm of skin: Z85.828

## 2015-06-02 HISTORY — DX: Personal history of urinary calculi: Z87.442

## 2015-06-02 LAB — URINALYSIS, ROUTINE W REFLEX MICROSCOPIC
BILIRUBIN URINE: NEGATIVE
GLUCOSE, UA: NEGATIVE mg/dL
Hgb urine dipstick: NEGATIVE
KETONES UR: NEGATIVE mg/dL
LEUKOCYTES UA: NEGATIVE
NITRITE: NEGATIVE
PH: 7.5 (ref 5.0–8.0)
Protein, ur: NEGATIVE mg/dL
SPECIFIC GRAVITY, URINE: 1.014 (ref 1.005–1.030)

## 2015-06-02 LAB — CBC
HEMATOCRIT: 46.2 % (ref 39.0–52.0)
HEMOGLOBIN: 16.1 g/dL (ref 13.0–17.0)
MCH: 32.5 pg (ref 26.0–34.0)
MCHC: 34.8 g/dL (ref 30.0–36.0)
MCV: 93.3 fL (ref 78.0–100.0)
Platelets: 222 10*3/uL (ref 150–400)
RBC: 4.95 MIL/uL (ref 4.22–5.81)
RDW: 12.3 % (ref 11.5–15.5)
WBC: 10.2 10*3/uL (ref 4.0–10.5)

## 2015-06-02 LAB — COMPREHENSIVE METABOLIC PANEL
ALK PHOS: 46 U/L (ref 38–126)
ALT: 12 U/L — ABNORMAL LOW (ref 17–63)
ANION GAP: 11 (ref 5–15)
AST: 22 U/L (ref 15–41)
Albumin: 4.5 g/dL (ref 3.5–5.0)
BILIRUBIN TOTAL: 0.9 mg/dL (ref 0.3–1.2)
BUN: 15 mg/dL (ref 6–20)
CALCIUM: 9.1 mg/dL (ref 8.9–10.3)
CO2: 27 mmol/L (ref 22–32)
Chloride: 103 mmol/L (ref 101–111)
Creatinine, Ser: 0.85 mg/dL (ref 0.61–1.24)
GFR calc non Af Amer: >60 mL/min (ref >60)
GLUCOSE: 120 mg/dL — AB (ref 65–99)
POTASSIUM: 3.8 mmol/L (ref 3.5–5.1)
Sodium: 141 mmol/L (ref 135–145)
TOTAL PROTEIN: 7.1 g/dL (ref 6.5–8.1)

## 2015-06-02 LAB — APTT: aPTT: 31 seconds (ref 24–37)

## 2015-06-02 LAB — SURGICAL PCR SCREEN
MRSA, PCR: NEGATIVE
STAPHYLOCOCCUS AUREUS: POSITIVE — AB

## 2015-06-02 LAB — PROTIME-INR
INR: 1.03 (ref 0.00–1.49)
PROTHROMBIN TIME: 13.7 s (ref 11.6–15.2)

## 2015-06-02 LAB — ABO/RH: ABO/RH(D): A POS

## 2015-06-02 NOTE — Progress Notes (Signed)
Rx Mupuricin called to [facility] [id number] - pt notified

## 2015-06-02 NOTE — H&P (Signed)
Jeffrey Andrews DOB: December 26, 1945 Married / Language: English / Race: White Male Date of Admission:  06/08/2015 CC:  Left and Right Knee Pain History of Present Illness  The patient is a 70 year old male who comes in today for a preoperative History and Physical. The patient is scheduled for a left total knee arthroplasty to be performed by Dr. Dione Plover. Aluisio, MD at [facility] on 06-08-2015. The patient is a 70 year old male who presented for follow up of their knee. The patient is being followed for their bilateral osteoarthritis. They are about a year out from cortisone injection (he got no relief from any of the injections). Symptoms reported include: pain, pain after sitting, pain at night, swelling, aching and stiffness. The patient feels that they are doing poorly (left knee is very painful) and report their pain level to be moderate. The following medication has been used for pain control: none. The patient has not gotten any relief of their symptoms with Cortisone injections. The patient indicates that they are ready to proceed wtih total knee. Unfortunately, the cortisone did not help much. He was trying to hold off as long as possible, but he feels like his knee has gotten to the point where it has essentially taken over his life now. It is limiting what he can and cannot do. He has pain with all activities as well as pain at rest. He is now ready to proceed with surgery. They have been treated conservatively in the past for the above stated problem and despite conservative measures, they continue to have progressive pain and severe functional limitations and dysfunction. They have failed non-operative management including home exercise, medications, and injections. It is felt that they would benefit from undergoing total joint replacement. Risks and benefits of the procedure have been discussed with the patient and they elect to proceed with surgery. There are no active contraindications to  surgery such as ongoing infection or rapidly progressive neurological disease.  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 Osteoarthritis  Mother. mother and father Cancer  mother Diabetes Mellitus  sister Father  Deceased, Cerebrovascular Accident. age 38 Mother  Deceased, Congestive Heart Failure. age 45  Social History Tobacco use  Never smoker. 12/10/2013 never smoker; uses 2 or more can(s) smokeless per week Illicit drug use  no Number of flights of stairs before winded  4-5 2-3 Tobacco / smoke exposure  12/10/2013: no no Pain Contract  no Previously in rehab  no Drug/Alcohol Rehab (Currently)  no Alcohol use  never consumed alcohol Exercise  Exercises never Living situation  live with spouse Children  5 or more Current work status  retired No history of drug/alcohol rehab  Not under pain contract  Marital status  married Never consumed alcohol  12/10/2013: Never consumed alcohol Advance Directives  Living Will [place] for home following the Left Total Knee Replacement on 06/07/2014  Medication History  Aspirin (81MG  Tablet Chewable, Oral) Active. Advil (200MG  Tablet, Oral as needed) Active. Simvastatin (40MG  Tablet, 1/2 Oral daily) Active. Triamterene-HCTZ (75-50MG  Tablet, 1/2 tab Oral daily) Active. Multivitamin Adults 50+ (Oral) Active.  Past  Surgical History Cataract Surgery  bilateral Spinal Surgery  3 times Other Surgery  reattachment quad tendon left knee Vasectomy  Tonsillectomy   Left Wrist Surgery    Review of Systems General Not Present- Chills, Fatigue, Fever, Memory Loss, Night Sweats, Weight Gain and Weight Loss. Skin Not Present- Eczema, Hives, Itching, Lesions and Rash. HEENT Not Present- Dentures, Double Vision, Headache, Hearing Loss, Tinnitus and Visual Loss. Respiratory Not Present- Allergies, Chronic Cough, Coughing up blood, Shortness of breath at rest and Shortness of breath with exertion. Cardiovascular Not Present- Chest Pain, Difficulty Breathing Lying Down, Murmur, Palpitations, Racing/skipping heartbeats and Swelling. Gastrointestinal Not Present- Abdominal Pain, Bloody Stool, Constipation, Diarrhea, Difficulty Swallowing, Heartburn, Jaundice, Loss of appetitie, Nausea and Vomiting. Male Genitourinary Not Present- Blood in Urine, Discharge, Flank Pain, Incontinence, Painful Urination, Urgency, Urinary frequency, Urinary Retention, Urinating at Night and Weak urinary stream. Musculoskeletal Present- Joint Pain. Not Present- Back Pain, Joint Swelling, Morning Stiffness, Muscle Pain, Muscle Weakness and Spasms. Neurological Not Present- Blackout spells, Difficulty with balance, Dizziness, Paralysis, Tremor and Weakness. Psychiatric Not Present- Insomnia.  Vitals Weight: 230 lb Height: 68in Weight was reported by patient. Height was reported by patient. Body Surface Area: 2.17 m Body Mass Index: 34.97 kg/m  BP: 145/88 (Sitting, Right Arm, Standard)   Physical Exam General Mental Status -Alert, cooperative and good historian. General Appearance-pleasant, Not in acute distress. Orientation-Oriented X3. Build & Nutrition-Well nourished and Well developed.  Head and Neck Head-normocephalic, atraumatic . Neck Global Assessment - supple, no bruit auscultated on the right, no bruit auscultated on the left. Note: bilateral hearing aids   Eye Pupil - Bilateral-Regular and Round. Motion - Bilateral-EOMI.  Chest and  Lung Exam Auscultation Breath sounds - clear at anterior chest wall and clear at posterior chest wall. Adventitious sounds - No Adventitious sounds.  Cardiovascular Auscultation Rhythm - Regular rate and rhythm. Heart Sounds - S1 WNL and S2 WNL. Murmurs & Other Heart Sounds: Murmur 1 - Location - Aortic Area and Pulmonic Area. Timing - Early systolic. Grade - II/VI. Character - Low pitched.  Abdomen Palpation/Percussion Tenderness - Abdomen is non-tender to palpation. Rigidity (guarding) - Abdomen is soft. Auscultation Auscultation of the abdomen reveals - Bowel sounds normal.  Male Genitourinary Note: Not done, not pertinent to present illness   Musculoskeletal Note: On exam, he is alert and oriented, in no apparent distress. His left knee shows no effusion. His range is about 5 to 125 with marked crepitus on range of motion. There is tenderness, medial greater than lateral with instability noted. Right knee range 0 to 125, moderate crepitus and range of motion. Some tenderness medial greater than lateral with no instability.  RADIOGRAPHS AP both knees and lateral show bone on bone arthritis, medial and patellofemoral compartments of the left knee. He also has advanced arthritis of the right knee.  Assessment & Plan  Primary osteoarthritis of right knee (M17.11) Primary osteoarthritis of left knee (M17.12)  Note:Surgical Plans: Left Total Knee Replacement  Disposition: Home  PCP: Dr. Dierdre Searles - [facility] - Patient has been seen preoperatively and felt to be stable for surgery.  IV TXA  Anesthesia Issues: None  Signed electronically by Joelene Millin, III PA-C

## 2015-06-08 ENCOUNTER — Inpatient Hospital Stay (HOSPITAL_COMMUNITY): Payer: Medicare Other | Admitting: Anesthesiology

## 2015-06-08 ENCOUNTER — Encounter (HOSPITAL_COMMUNITY): Payer: Self-pay | Admitting: *Deleted

## 2015-06-08 ENCOUNTER — Encounter (HOSPITAL_COMMUNITY)
Admission: AD | Disposition: A | Discharge status: Home / Self Care | Payer: Self-pay | Source: Ambulatory Visit | Attending: Orthopedic Surgery

## 2015-06-08 ENCOUNTER — Inpatient Hospital Stay (HOSPITAL_COMMUNITY)
Admission: AD | Admit: 2015-06-08 | Discharge: 2015-06-10 | DRG: 470 | Disposition: A | Discharge status: Home / Self Care | Payer: Medicare Other | Source: Ambulatory Visit | Attending: Orthopedic Surgery | Admitting: Orthopedic Surgery

## 2015-06-08 DIAGNOSIS — I10 Essential (primary) hypertension: Secondary | ICD-10-CM | POA: Diagnosis present

## 2015-06-08 DIAGNOSIS — G4730 Sleep apnea, unspecified: Secondary | ICD-10-CM | POA: Diagnosis present

## 2015-06-08 DIAGNOSIS — Z9841 Cataract extraction status, right eye: Secondary | ICD-10-CM

## 2015-06-08 DIAGNOSIS — M1712 Unilateral primary osteoarthritis, left knee: Principal | ICD-10-CM | POA: Diagnosis present

## 2015-06-08 DIAGNOSIS — M179 Osteoarthritis of knee, unspecified: Secondary | ICD-10-CM | POA: Diagnosis present

## 2015-06-08 DIAGNOSIS — M1710 Unilateral primary osteoarthritis, unspecified knee: Secondary | ICD-10-CM | POA: Diagnosis present

## 2015-06-08 DIAGNOSIS — M1711 Unilateral primary osteoarthritis, right knee: Secondary | ICD-10-CM | POA: Diagnosis present

## 2015-06-08 DIAGNOSIS — Z9842 Cataract extraction status, left eye: Secondary | ICD-10-CM

## 2015-06-08 DIAGNOSIS — E7800 Pure hypercholesterolemia, unspecified: Secondary | ICD-10-CM | POA: Diagnosis present

## 2015-06-08 HISTORY — PX: TOTAL KNEE ARTHROPLASTY: SHX125

## 2015-06-08 HISTORY — DX: Malignant (primary) neoplasm, unspecified: C80.1

## 2015-06-08 HISTORY — PX: STERIOD INJECTION: SHX5046

## 2015-06-08 LAB — TYPE AND SCREEN
ABO/RH(D): A POS
ANTIBODY SCREEN: NEGATIVE

## 2015-06-08 SURGERY — ARTHROPLASTY, KNEE, TOTAL
Anesthesia: General | Site: Knee | Laterality: Right

## 2015-06-08 MED ORDER — DEXAMETHASONE SODIUM PHOSPHATE 10 MG/ML IJ SOLN
INTRAMUSCULAR | Status: AC
  Filled 2015-06-08: qty 1

## 2015-06-08 MED ORDER — TRAMADOL HCL 50 MG PO TABS: 50.0000 mg | ORAL_TABLET | Freq: Four times a day (QID) | ORAL | Status: DC | PRN

## 2015-06-08 MED ORDER — DEXAMETHASONE SODIUM PHOSPHATE 10 MG/ML IJ SOLN
10.0000 mg | Freq: Once | INTRAMUSCULAR | Status: DC
  Filled 2015-06-08: qty 1

## 2015-06-08 MED ORDER — PHENYLEPHRINE 40 MCG/ML (10ML) SYRINGE FOR IV PUSH (FOR BLOOD PRESSURE SUPPORT)
PREFILLED_SYRINGE | INTRAVENOUS | Status: AC
  Filled 2015-06-08: qty 10

## 2015-06-08 MED ORDER — FENTANYL CITRATE (PF) 100 MCG/2ML IJ SOLN
INTRAMUSCULAR | Status: DC | PRN
  Administered 2015-06-08 (×2): 25 ug via INTRAVENOUS
  Administered 2015-06-08: 50 ug via INTRAVENOUS

## 2015-06-08 MED ORDER — FENTANYL CITRATE (PF) 100 MCG/2ML IJ SOLN: 25.0000 ug | INTRAMUSCULAR | Status: DC | PRN

## 2015-06-08 MED ORDER — METOCLOPRAMIDE HCL 10 MG PO TABS: 5.0000 mg | ORAL_TABLET | Freq: Three times a day (TID) | ORAL | Status: DC | PRN

## 2015-06-08 MED ORDER — HYDROCHLOROTHIAZIDE 25 MG PO TABS
12.5000 mg | ORAL_TABLET | Freq: Every day | ORAL | Status: DC
  Filled 2015-06-08: qty 0.5

## 2015-06-08 MED ORDER — OXYCODONE HCL 5 MG PO TABS
5.0000 mg | ORAL_TABLET | ORAL | Status: DC | PRN
  Administered 2015-06-08 – 2015-06-10 (×7): 10 mg via ORAL
  Filled 2015-06-08 (×7): qty 2

## 2015-06-08 MED ORDER — SIMVASTATIN 20 MG PO TABS
20.0000 mg | ORAL_TABLET | Freq: Every day | ORAL | Status: DC
  Administered 2015-06-09 – 2015-06-10 (×2): 20 mg via ORAL
  Filled 2015-06-08 (×2): qty 1

## 2015-06-08 MED ORDER — PHENYLEPHRINE HCL 10 MG/ML IJ SOLN
INTRAMUSCULAR | Status: DC | PRN
  Administered 2015-06-08 (×5): 80 ug via INTRAVENOUS

## 2015-06-08 MED ORDER — LACTATED RINGERS IV SOLN
INTRAVENOUS | Status: DC | PRN
  Administered 2015-06-08 (×2): via INTRAVENOUS

## 2015-06-08 MED ORDER — BUPIVACAINE HCL 0.25 % IJ SOLN
INTRAMUSCULAR | Status: DC | PRN
Start: 2015-06-08 — End: 2015-06-08
  Administered 2015-06-08: 20 mL

## 2015-06-08 MED ORDER — CEFAZOLIN SODIUM-DEXTROSE 2-3 GM-% IV SOLR
INTRAVENOUS | Status: AC
  Filled 2015-06-08: qty 50

## 2015-06-08 MED ORDER — TRANEXAMIC ACID 1000 MG/10ML IV SOLN
1000.0000 mg | INTRAVENOUS | Status: AC
  Administered 2015-06-08: 1000 mg via INTRAVENOUS
  Filled 2015-06-08: qty 10

## 2015-06-08 MED ORDER — BUPIVACAINE LIPOSOME 1.3 % IJ SUSP
20.0000 mL | Freq: Once | INTRAMUSCULAR | Status: DC
  Filled 2015-06-08: qty 20

## 2015-06-08 MED ORDER — ONDANSETRON HCL 4 MG/2ML IJ SOLN
INTRAMUSCULAR | Status: AC
  Filled 2015-06-08: qty 2

## 2015-06-08 MED ORDER — RIVAROXABAN 10 MG PO TABS
10.0000 mg | ORAL_TABLET | Freq: Every day | ORAL | Status: DC
  Administered 2015-06-09 – 2015-06-10 (×2): 10 mg via ORAL
  Filled 2015-06-08 (×4): qty 1

## 2015-06-08 MED ORDER — BUPIVACAINE LIPOSOME 1.3 % IJ SUSP
INTRAMUSCULAR | Status: DC | PRN
  Administered 2015-06-08: 20 mL

## 2015-06-08 MED ORDER — SODIUM CHLORIDE 0.9 % IR SOLN
Status: DC | PRN
  Administered 2015-06-08: 1000 mL

## 2015-06-08 MED ORDER — POTASSIUM CHLORIDE IN NACL 20-0.9 MEQ/L-% IV SOLN
INTRAVENOUS | Status: DC
  Administered 2015-06-09: 02:00:00 via INTRAVENOUS
  Filled 2015-06-08 (×3): qty 1000

## 2015-06-08 MED ORDER — ONDANSETRON HCL 4 MG/2ML IJ SOLN: 4.0000 mg | Freq: Four times a day (QID) | INTRAMUSCULAR | Status: DC | PRN

## 2015-06-08 MED ORDER — CEFAZOLIN SODIUM-DEXTROSE 2-3 GM-% IV SOLR
2.0000 g | Freq: Four times a day (QID) | INTRAVENOUS | Status: AC
  Administered 2015-06-08 – 2015-06-09 (×2): 2 g via INTRAVENOUS
  Filled 2015-06-08 (×2): qty 50

## 2015-06-08 MED ORDER — DIPHENHYDRAMINE HCL 12.5 MG/5ML PO ELIX: 12.5000 mg | ORAL_SOLUTION | ORAL | Status: DC | PRN

## 2015-06-08 MED ORDER — PROPOFOL 10 MG/ML IV BOLUS
INTRAVENOUS | Status: DC | PRN
  Administered 2015-06-08 (×2): 20 mg via INTRAVENOUS

## 2015-06-08 MED ORDER — FENTANYL CITRATE (PF) 100 MCG/2ML IJ SOLN
INTRAMUSCULAR | Status: AC
  Filled 2015-06-08: qty 2

## 2015-06-08 MED ORDER — DOCUSATE SODIUM 100 MG PO CAPS
100.0000 mg | ORAL_CAPSULE | Freq: Two times a day (BID) | ORAL | Status: DC
  Administered 2015-06-08 – 2015-06-10 (×4): 100 mg via ORAL
  Filled 2015-06-08 (×6): qty 1

## 2015-06-08 MED ORDER — MEPERIDINE HCL 50 MG/ML IJ SOLN: 6.2500 mg | INTRAMUSCULAR | Status: DC | PRN

## 2015-06-08 MED ORDER — METHOCARBAMOL 500 MG PO TABS: 500.0000 mg | ORAL_TABLET | Freq: Four times a day (QID) | ORAL | Status: DC | PRN

## 2015-06-08 MED ORDER — PROPOFOL 500 MG/50ML IV EMUL
INTRAVENOUS | Status: DC | PRN
Start: 2015-06-08 — End: 2015-06-08
  Administered 2015-06-08: 50 ug/kg/min via INTRAVENOUS

## 2015-06-08 MED ORDER — ONDANSETRON HCL 4 MG PO TABS: 4.0000 mg | ORAL_TABLET | Freq: Four times a day (QID) | ORAL | Status: DC | PRN

## 2015-06-08 MED ORDER — SODIUM CHLORIDE 0.9 % IV SOLN: INTRAVENOUS | Status: DC

## 2015-06-08 MED ORDER — ACETAMINOPHEN 325 MG PO TABS: 650.0000 mg | ORAL_TABLET | Freq: Four times a day (QID) | ORAL | Status: DC | PRN

## 2015-06-08 MED ORDER — METHOCARBAMOL 1000 MG/10ML IJ SOLN
500.0000 mg | Freq: Four times a day (QID) | INTRAMUSCULAR | Status: DC | PRN
  Administered 2015-06-08: 500 mg via INTRAVENOUS
  Filled 2015-06-08 (×2): qty 5

## 2015-06-08 MED ORDER — ACETAMINOPHEN 650 MG RE SUPP: 650.0000 mg | Freq: Four times a day (QID) | RECTAL | Status: DC | PRN

## 2015-06-08 MED ORDER — DEXAMETHASONE SODIUM PHOSPHATE 10 MG/ML IJ SOLN
10.0000 mg | Freq: Once | INTRAMUSCULAR | Status: AC
  Administered 2015-06-08: 10 mg via INTRAVENOUS

## 2015-06-08 MED ORDER — BISACODYL 10 MG RE SUPP: 10.0000 mg | Freq: Every day | RECTAL | Status: DC | PRN

## 2015-06-08 MED ORDER — CEFAZOLIN SODIUM-DEXTROSE 2-3 GM-% IV SOLR
2.0000 g | INTRAVENOUS | Status: AC
  Administered 2015-06-08: 2 g via INTRAVENOUS

## 2015-06-08 MED ORDER — MENTHOL 3 MG MT LOZG
1.0000 | LOZENGE | OROMUCOSAL | Status: DC | PRN
  Filled 2015-06-08: qty 9

## 2015-06-08 MED ORDER — SODIUM CHLORIDE 0.9 % IJ SOLN
INTRAMUSCULAR | Status: DC | PRN
  Administered 2015-06-08: 30 mL

## 2015-06-08 MED ORDER — CHLORHEXIDINE GLUCONATE 4 % EX LIQD: 60.0000 mL | Freq: Once | CUTANEOUS | Status: DC

## 2015-06-08 MED ORDER — METHYLPREDNISOLONE ACETATE 40 MG/ML IJ SUSP
INTRAMUSCULAR | Status: AC
  Filled 2015-06-08: qty 2

## 2015-06-08 MED ORDER — ACETAMINOPHEN 10 MG/ML IV SOLN
1000.0000 mg | Freq: Once | INTRAVENOUS | Status: AC
  Administered 2015-06-08: 1000 mg via INTRAVENOUS
  Filled 2015-06-08: qty 100

## 2015-06-08 MED ORDER — MORPHINE SULFATE (PF) 2 MG/ML IV SOLN: 1.0000 mg | INTRAVENOUS | Status: DC | PRN

## 2015-06-08 MED ORDER — SODIUM CHLORIDE 0.9 % IJ SOLN
INTRAMUSCULAR | Status: AC
  Filled 2015-06-08: qty 50

## 2015-06-08 MED ORDER — POTASSIUM CHLORIDE IN NACL 20-0.9 MEQ/L-% IV SOLN
INTRAVENOUS | Status: AC
  Filled 2015-06-08: qty 1000

## 2015-06-08 MED ORDER — STERILE WATER FOR IRRIGATION IR SOLN
Status: DC | PRN
  Administered 2015-06-08: 1000 mL

## 2015-06-08 MED ORDER — METOCLOPRAMIDE HCL 5 MG/ML IJ SOLN: 5.0000 mg | Freq: Three times a day (TID) | INTRAMUSCULAR | Status: DC | PRN

## 2015-06-08 MED ORDER — FLEET ENEMA 7-19 GM/118ML RE ENEM: 1.0000 | ENEMA | Freq: Once | RECTAL | Status: DC | PRN

## 2015-06-08 MED ORDER — ONDANSETRON HCL 4 MG/2ML IJ SOLN
INTRAMUSCULAR | Status: DC | PRN
  Administered 2015-06-08: 4 mg via INTRAVENOUS

## 2015-06-08 MED ORDER — METHYLPREDNISOLONE ACETATE 40 MG/ML IJ SUSP
INTRAMUSCULAR | Status: DC | PRN
  Administered 2015-06-08: 80 mg

## 2015-06-08 MED ORDER — PROPOFOL 10 MG/ML IV BOLUS
INTRAVENOUS | Status: AC
  Filled 2015-06-08: qty 40

## 2015-06-08 MED ORDER — BUPIVACAINE HCL (PF) 0.25 % IJ SOLN
INTRAMUSCULAR | Status: AC
  Filled 2015-06-08: qty 30

## 2015-06-08 MED ORDER — 0.9 % SODIUM CHLORIDE (POUR BTL) OPTIME
TOPICAL | Status: DC | PRN
  Administered 2015-06-08: 1000 mL

## 2015-06-08 MED ORDER — ACETAMINOPHEN 500 MG PO TABS
1000.0000 mg | ORAL_TABLET | Freq: Four times a day (QID) | ORAL | Status: AC
  Administered 2015-06-08 – 2015-06-09 (×4): 1000 mg via ORAL
  Filled 2015-06-08 (×5): qty 2

## 2015-06-08 MED ORDER — ACETAMINOPHEN 10 MG/ML IV SOLN
INTRAVENOUS | Status: AC
  Filled 2015-06-08: qty 100

## 2015-06-08 MED ORDER — PHENOL 1.4 % MT LIQD
1.0000 | OROMUCOSAL | Status: DC | PRN
  Filled 2015-06-08: qty 177

## 2015-06-08 MED ORDER — POLYETHYLENE GLYCOL 3350 17 G PO PACK: 17.0000 g | PACK | Freq: Every day | ORAL | Status: DC | PRN

## 2015-06-08 MED ORDER — PROMETHAZINE HCL 25 MG/ML IJ SOLN: 6.2500 mg | INTRAMUSCULAR | Status: DC | PRN

## 2015-06-08 SURGICAL SUPPLY — 54 items
BAG DECANTER FOR FLEXI CONT (MISCELLANEOUS) IMPLANT
BAG ZIPLOCK 12X15 (MISCELLANEOUS) ×4 IMPLANT
BANDAGE ACE 6X5 VEL STRL LF (GAUZE/BANDAGES/DRESSINGS) ×4 IMPLANT
BLADE SAG 18X100X1.27 (BLADE) ×4 IMPLANT
BLADE SAW SGTL 11.0X1.19X90.0M (BLADE) ×4 IMPLANT
BOWL SMART MIX CTS (DISPOSABLE) ×4 IMPLANT
CAPT KNEE TOTAL 3 ATTUNE ×4 IMPLANT
CEMENT HV SMART SET (Cement) ×4 IMPLANT
CLOSURE WOUND 1/2 X4 (GAUZE/BANDAGES/DRESSINGS) ×2
CLOTH BEACON ORANGE TIMEOUT ST (SAFETY) ×4 IMPLANT
CUFF TOURN SGL QUICK 34 (TOURNIQUET CUFF) ×2
CUFF TRNQT CYL 34X4X40X1 (TOURNIQUET CUFF) ×2 IMPLANT
DECANTER SPIKE VIAL GLASS SM (MISCELLANEOUS) ×4 IMPLANT
DRAPE U-SHAPE 47X51 STRL (DRAPES) ×4 IMPLANT
DRSG ADAPTIC 3X8 NADH LF (GAUZE/BANDAGES/DRESSINGS) ×4 IMPLANT
DRSG PAD ABDOMINAL 8X10 ST (GAUZE/BANDAGES/DRESSINGS) IMPLANT
DURAPREP 26ML APPLICATOR (WOUND CARE) ×4 IMPLANT
ELECT REM PT RETURN 9FT ADLT (ELECTROSURGICAL) ×4
ELECTRODE REM PT RTRN 9FT ADLT (ELECTROSURGICAL) ×2 IMPLANT
EVACUATOR 1/8 PVC DRAIN (DRAIN) ×4 IMPLANT
GAUZE SPONGE 4X4 12PLY STRL (GAUZE/BANDAGES/DRESSINGS) ×4 IMPLANT
GLOVE BIO SURGEON STRL SZ7.5 (GLOVE) IMPLANT
GLOVE BIO SURGEON STRL SZ8 (GLOVE) ×4 IMPLANT
GLOVE BIOGEL PI IND STRL 6.5 (GLOVE) IMPLANT
GLOVE BIOGEL PI IND STRL 8 (GLOVE) ×2 IMPLANT
GLOVE BIOGEL PI INDICATOR 6.5 (GLOVE)
GLOVE BIOGEL PI INDICATOR 8 (GLOVE) ×2
GLOVE SURG SS PI 6.5 STRL IVOR (GLOVE) IMPLANT
GOWN STRL REUS W/TWL LRG LVL3 (GOWN DISPOSABLE) ×4 IMPLANT
GOWN STRL REUS W/TWL XL LVL3 (GOWN DISPOSABLE) IMPLANT
HANDPIECE INTERPULSE COAX TIP (DISPOSABLE) ×2
IMMOBILIZER KNEE 20 (SOFTGOODS) ×4
IMMOBILIZER KNEE 20 THIGH 36 (SOFTGOODS) ×2 IMPLANT
MANIFOLD NEPTUNE II (INSTRUMENTS) ×4 IMPLANT
NS IRRIG 1000ML POUR BTL (IV SOLUTION) ×4 IMPLANT
PACK TOTAL KNEE CUSTOM (KITS) ×4 IMPLANT
PAD ABD 8X10 STRL (GAUZE/BANDAGES/DRESSINGS) ×4 IMPLANT
PADDING CAST ABS 6INX4YD NS (CAST SUPPLIES) ×4
PADDING CAST ABS COTTON 6X4 NS (CAST SUPPLIES) ×4 IMPLANT
PADDING CAST COTTON 6X4 STRL (CAST SUPPLIES) ×4 IMPLANT
POSITIONER SURGICAL ARM (MISCELLANEOUS) ×4 IMPLANT
SET HNDPC FAN SPRY TIP SCT (DISPOSABLE) ×2 IMPLANT
STRIP CLOSURE SKIN 1/2X4 (GAUZE/BANDAGES/DRESSINGS) ×6 IMPLANT
SUT MNCRL AB 4-0 PS2 18 (SUTURE) ×4 IMPLANT
SUT VIC AB 2-0 CT1 27 (SUTURE) ×6
SUT VIC AB 2-0 CT1 TAPERPNT 27 (SUTURE) ×6 IMPLANT
SUT VLOC 180 0 24IN GS25 (SUTURE) ×4 IMPLANT
SYR 50ML LL SCALE MARK (SYRINGE) ×4 IMPLANT
TAPE STRIPS DRAPE STRL (GAUZE/BANDAGES/DRESSINGS) ×4 IMPLANT
TRAY FOLEY W/METER SILVER 14FR (SET/KITS/TRAYS/PACK) IMPLANT
TRAY FOLEY W/METER SILVER 16FR (SET/KITS/TRAYS/PACK) ×4 IMPLANT
WATER STERILE IRR 1500ML POUR (IV SOLUTION) ×4 IMPLANT
WRAP KNEE MAXI GEL POST OP (GAUZE/BANDAGES/DRESSINGS) ×4 IMPLANT
YANKAUER SUCT BULB TIP 10FT TU (MISCELLANEOUS) ×4 IMPLANT

## 2015-06-08 NOTE — Anesthesia Preprocedure Evaluation (Addendum)
Anesthesia Evaluation  Patient identified by MRN, date of birth, ID band Patient awake    Reviewed: Allergy & Precautions, NPO status , Patient's Chart, lab work & pertinent test results  Airway Mallampati: II  TM Distance: >3 FB Neck ROM: Full    Dental no notable dental hx.    Pulmonary sleep apnea and Continuous Positive Airway Pressure Ventilation ,    Pulmonary exam normal breath sounds clear to auscultation       Cardiovascular hypertension, Pt. on medications Normal cardiovascular exam Rhythm:Regular Rate:Normal     Neuro/Psych negative neurological ROS  negative psych ROS   GI/Hepatic negative GI ROS, Neg liver ROS,   Endo/Other  negative endocrine ROS  Renal/GU negative Renal ROS  negative genitourinary   Musculoskeletal negative musculoskeletal ROS (+)   Abdominal   Peds negative pediatric ROS (+)  Hematology negative hematology ROS (+)   Anesthesia Other Findings Multiple previous back surgeries. Spinal stenosis. Previous successful SAB without sequelae for quad tendon repair.  Reproductive/Obstetrics negative OB ROS                            Anesthesia Physical Anesthesia Plan  ASA: II  Anesthesia Plan: General   Post-op Pain Management:    Induction: Intravenous  Airway Management Planned: Oral ETT  Additional Equipment:   Intra-op Plan:   Post-operative Plan: Extubation in OR  Informed Consent: I have reviewed the patients History and Physical, chart, labs and discussed the procedure including the risks, benefits and alternatives for the proposed anesthesia with the patient or authorized representative who has indicated his/her understanding and acceptance.   Dental advisory given  Plan Discussed with: CRNA  Anesthesia Plan Comments:         Anesthesia Quick Evaluation

## 2015-06-08 NOTE — H&P (View-Only) (Signed)
Jeffrey Andrews DOB: 28-Mar-1946 Married / Language: English / Race: White Male Date of Admission:  06/08/2015 CC:  Left and Right Knee Pain History of Present Illness  The patient is a 70 year old male who comes in today for a preoperative History and Physical. The patient is scheduled for a left total knee arthroplasty to be performed by Dr. Dione Plover. Aluisio, MD at [facility] on 06-08-2015. The patient is a 70 year old male who presented for follow up of their knee. The patient is being followed for their bilateral osteoarthritis. They are about a year out from cortisone injection (he got no relief from any of the injections). Symptoms reported include: pain, pain after sitting, pain at night, swelling, aching and stiffness. The patient feels that they are doing poorly (left knee is very painful) and report their pain level to be moderate. The following medication has been used for pain control: none. The patient has not gotten any relief of their symptoms with Cortisone injections. The patient indicates that they are ready to proceed wtih total knee. Unfortunately, the cortisone did not help much. He was trying to hold off as long as possible, but he feels like his knee has gotten to the point where it has essentially taken over his life now. It is limiting what he can and cannot do. He has pain with all activities as well as pain at rest. He is now ready to proceed with surgery. They have been treated conservatively in the past for the above stated problem and despite conservative measures, they continue to have progressive pain and severe functional limitations and dysfunction. They have failed non-operative management including home exercise, medications, and injections. It is felt that they would benefit from undergoing total joint replacement. Risks and benefits of the procedure have been discussed with the patient and they elect to proceed with surgery. There are no active contraindications to  surgery such as ongoing infection or rapidly progressive neurological disease.  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 Osteoarthritis  Mother. mother and father Cancer  mother Diabetes Mellitus  sister Father  Deceased, Cerebrovascular Accident. age 58 Mother  Deceased, Congestive Heart Failure. age 70  Social History Tobacco use  Never smoker. 12/10/2013 never smoker; uses 2 or more can(s) smokeless per week Illicit drug use  no Number of flights of stairs before winded  4-5 2-3 Tobacco / smoke exposure  12/10/2013: no no Pain Contract  no Previously in rehab  no Drug/Alcohol Rehab (Currently)  no Alcohol use  never consumed alcohol Exercise  Exercises never Living situation  live with spouse Children  5 or more Current work status  retired No history of drug/alcohol rehab  Not under pain contract  Marital status  married Never consumed alcohol  12/10/2013: Never consumed alcohol Advance Directives  Living Will [place] for home following the Left Total Knee Replacement on 06/07/2014  Medication History  Aspirin (81MG  Tablet Chewable, Oral) Active. Advil (200MG  Tablet, Oral as needed) Active. Simvastatin (40MG  Tablet, 1/2 Oral daily) Active. Triamterene-HCTZ (75-50MG  Tablet, 1/2 tab Oral daily) Active. Multivitamin Adults 50+ (Oral) Active.  Past  Surgical History Cataract Surgery  bilateral Spinal Surgery  3 times Other Surgery  reattachment quad tendon left knee Vasectomy  Tonsillectomy   Left Wrist Surgery    Review of Systems General Not Present- Chills, Fatigue, Fever, Memory Loss, Night Sweats, Weight Gain and Weight Loss. Skin Not Present- Eczema, Hives, Itching, Lesions and Rash. HEENT Not Present- Dentures, Double Vision, Headache, Hearing Loss, Tinnitus and Visual Loss. Respiratory Not Present- Allergies, Chronic Cough, Coughing up blood, Shortness of breath at rest and Shortness of breath with exertion. Cardiovascular Not Present- Chest Pain, Difficulty Breathing Lying Down, Murmur, Palpitations, Racing/skipping heartbeats and Swelling. Gastrointestinal Not Present- Abdominal Pain, Bloody Stool, Constipation, Diarrhea, Difficulty Swallowing, Heartburn, Jaundice, Loss of appetitie, Nausea and Vomiting. Male Genitourinary Not Present- Blood in Urine, Discharge, Flank Pain, Incontinence, Painful Urination, Urgency, Urinary frequency, Urinary Retention, Urinating at Night and Weak urinary stream. Musculoskeletal Present- Joint Pain. Not Present- Back Pain, Joint Swelling, Morning Stiffness, Muscle Pain, Muscle Weakness and Spasms. Neurological Not Present- Blackout spells, Difficulty with balance, Dizziness, Paralysis, Tremor and Weakness. Psychiatric Not Present- Insomnia.  Vitals Weight: 230 lb Height: 68in Weight was reported by patient. Height was reported by patient. Body Surface Area: 2.17 m Body Mass Index: 34.97 kg/m  BP: 145/88 (Sitting, Right Arm, Standard)   Physical Exam General Mental Status -Alert, cooperative and good historian. General Appearance-pleasant, Not in acute distress. Orientation-Oriented X3. Build & Nutrition-Well nourished and Well developed.  Head and Neck Head-normocephalic, atraumatic . Neck Global Assessment - supple, no bruit auscultated on the right, no bruit auscultated on the left. Note: bilateral hearing aids   Eye Pupil - Bilateral-Regular and Round. Motion - Bilateral-EOMI.  Chest and  Lung Exam Auscultation Breath sounds - clear at anterior chest wall and clear at posterior chest wall. Adventitious sounds - No Adventitious sounds.  Cardiovascular Auscultation Rhythm - Regular rate and rhythm. Heart Sounds - S1 WNL and S2 WNL. Murmurs & Other Heart Sounds: Murmur 1 - Location - Aortic Area and Pulmonic Area. Timing - Early systolic. Grade - II/VI. Character - Low pitched.  Abdomen Palpation/Percussion Tenderness - Abdomen is non-tender to palpation. Rigidity (guarding) - Abdomen is soft. Auscultation Auscultation of the abdomen reveals - Bowel sounds normal.  Male Genitourinary Note: Not done, not pertinent to present illness   Musculoskeletal Note: On exam, he is alert and oriented, in no apparent distress. His left knee shows no effusion. His range is about 5 to 125 with marked crepitus on range of motion. There is tenderness, medial greater than lateral with instability noted. Right knee range 0 to 125, moderate crepitus and range of motion. Some tenderness medial greater than lateral with no instability.  RADIOGRAPHS AP both knees and lateral show bone on bone arthritis, medial and patellofemoral compartments of the left knee. He also has advanced arthritis of the right knee.  Assessment & Plan  Primary osteoarthritis of right knee (M17.11) Primary osteoarthritis of left knee (M17.12)  Note:Surgical Plans: Left Total Knee Replacement  Disposition: Home  PCP: Dr. Dierdre Searles - [facility] - Patient has been seen preoperatively and felt to be stable for surgery.  IV TXA  Anesthesia Issues: None  Signed electronically by Joelene Millin, III PA-C

## 2015-06-08 NOTE — Op Note (Addendum)
Pre-operative diagnosis- Osteoarthritis  Bilateral knee(s)  Post-operative diagnosis- Osteoarthritis Bilateral knee(s)  Procedure-  Left  Total Knee Arthroplasty   Right knee cortisone injection Surgeon- Jeffrey Plover. Lissett Favorite, MD  Assistant- Arlee Muslim, PA-C   Anesthesia-  Spinal  EBL-* No blood loss amount entered *   Drains Hemovac  Tourniquet time-  Total Tourniquet Time Documented: Thigh (Left) - 39 minutes Total: Thigh (Left) - 39 minutes     Complications- None  Condition-PACU - hemodynamically stable.   Brief Clinical Note   Jeffrey Andrews is a 70 y.o. year old male with end stage OA of his left knee with progressively worsening pain and dysfunction. He has constant pain, with activity and at rest and significant functional deficits with difficulties even with ADLs. He has had extensive non-op management including analgesics, injections of cortisone, and home exercise program, but remains in significant pain with significant dysfunction. Radiographs show bone on bone arthritis medial and patellofemoral with varus deformity. He presents now for left Total Knee Arthroplasty. He also has OA of the right knee and requests cortisone injection.    Procedure in detail---   The patient is brought into the operating room and positioned supine on the operating table. After successful administration of  Spinal,   a tourniquet is placed high on the  Left thigh(s) and the lower extremity is prepped and draped in the usual sterile fashion. Time out is performed by the operating team and then the  Left lower extremity is wrapped in Esmarch, knee flexed and the tourniquet inflated to 300 mmHg.       A midline incision is made with a ten blade through the subcutaneous tissue to the level of the extensor mechanism. A fresh blade is used to make a medial parapatellar arthrotomy. Soft tissue over the proximal medial tibia is subperiosteally elevated to the joint line with a knife and into the  semimembranosus bursa with a Cobb elevator. Soft tissue over the proximal lateral tibia is elevated with attention being paid to avoiding the patellar tendon on the tibial tubercle. The patella is everted, knee flexed 90 degrees and the ACL and PCL are removed. Findings are bone on bone medial and patellofemoral with large global osteophytes.        The drill is used to create a starting hole in the distal femur and the canal is thoroughly irrigated with sterile saline to remove the fatty contents. The 5 degree Left  valgus alignment guide is placed into the femoral canal and the distal femoral cutting block is pinned to remove 9 mm off the distal femur. Resection is made with an oscillating saw.      The tibia is subluxed forward and the menisci are removed. The extramedullary alignment guide is placed referencing proximally at the medial aspect of the tibial tubercle and distally along the second metatarsal axis and tibial crest. The block is pinned to remove 60mm off the more deficient medial  side. Resection is made with an oscillating saw. Size 7is the most appropriate size for the tibia and the proximal tibia is prepared with the modular drill and keel punch for that size.      The femoral sizing guide is placed and size 7 is most appropriate. Rotation is marked off the epicondylar axis and confirmed by creating a rectangular flexion gap at 90 degrees. The size 7 cutting block is pinned in this rotation and the anterior, posterior and chamfer cuts are made with the oscillating saw. The intercondylar  block is then placed and that cut is made.      Trial size 7 tibial component, trial size 7 posterior stabilized femur and a 8  mm posterior stabilized rotating platform insert trial is placed. Full extension is achieved with excellent varus/valgus and anterior/posterior balance throughout full range of motion. The patella is everted and thickness measured to be 25  mm. Free hand resection is taken to 15 mm, a  38 template is placed, lug holes are drilled, trial patella is placed, and it tracks normally. Osteophytes are removed off the posterior femur with the trial in place. All trials are removed and the cut bone surfaces prepared with pulsatile lavage. Cement is mixed and once ready for implantation, the size 7 tibial implant, size  7 posterior stabilized femoral component, and the size 38 patella are cemented in place and the patella is held with the clamp. The trial insert is placed and the knee held in full extension. The Exparel (20 ml mixed with 30 ml saline) and .25% Bupivicaine, are injected into the extensor mechanism, posterior capsule, medial and lateral gutters and subcutaneous tissues.  All extruded cement is removed and once the cement is hard the permanent 8 mm posterior stabilized rotating platform insert is placed into the tibial tray.      The wound is copiously irrigated with saline solution and the extensor mechanism closed over a hemovac drain with #1 V-loc suture. The tourniquet is released for a total tourniquet time of 39  minutes. Flexion against gravity is 140 degrees and the patella tracks normally. Subcutaneous tissue is closed with 2.0 vicryl and subcuticular with running 4.0 Monocryl. The incision is cleaned and dried and steri-strips and a bulky sterile dressing are applied. The limb is placed into a knee immobilizer.       I then prepped the right knee with Betadine and injected with 80 mg Depomedrol with no problems. The patient is awakened and transported to recovery in stable condition.      Please note that a surgical assistant was a medical necessity for this procedure in order to perform it in a safe and expeditious manner. Surgical assistant was necessary to retract the ligaments and vital neurovascular structures to prevent injury to them and also necessary for proper positioning of the limb to allow for anatomic placement of the prosthesis.   Jeffrey Plover [name],  MD    06/08/2015, 1:42 PM

## 2015-06-08 NOTE — Progress Notes (Signed)
Utilization review completed.  

## 2015-06-08 NOTE — Transfer of Care (Signed)
Immediate Anesthesia Transfer of Care Note  Patient: Jeffrey Andrews  Procedure(s) Performed: Procedure(s): TOTAL KNEE ARTHROPLASTY (Left) STEROID INJECTION (Right)  Patient Location: PACU  Anesthesia Type:Spinal  Level of Consciousness:  sedated, patient cooperative and responds to stimulation  Airway & Oxygen Therapy:Patient Spontanous Breathing and Patient connected to face mask oxgen  Post-op Assessment:  Report given to PACU RN and Post -op Vital signs reviewed and stable  Post vital signs:  Reviewed and stable  Last Vitals:  Filed Vitals:   06/08/15 0942  BP: 158/82  Pulse: 76  Temp: 37.1 C  Resp: 18    Complications: No apparent anesthesia complications

## 2015-06-08 NOTE — Anesthesia Postprocedure Evaluation (Signed)
Anesthesia Post Note  Patient: Jeffrey Andrews  Procedure(s) Performed: Procedure(s) (LRB): TOTAL KNEE ARTHROPLASTY (Left) STEROID INJECTION (Right)  Patient location during evaluation: PACU Anesthesia Type: Spinal Level of consciousness: oriented and awake and alert Pain management: pain level controlled Vital Signs Assessment: post-procedure vital signs reviewed and stable Respiratory status: spontaneous breathing, respiratory function stable and patient connected to nasal cannula oxygen Cardiovascular status: blood pressure returned to baseline and stable Postop Assessment: no headache and no backache Anesthetic complications: no    Last Vitals:  Filed Vitals:   06/08/15 1630 06/08/15 1700  BP: 163/86 166/83  Pulse: 76 93  Temp: 36.8 C 36.9 C  Resp:  10    Last Pain:  Filed Vitals:   06/08/15 1744  PainSc: 0-No pain                 Montez Hageman

## 2015-06-08 NOTE — Interval H&P Note (Signed)
History and Physical Interval Note:  06/08/2015 10:29 AM  Jeffrey Andrews  has presented today for surgery, with the diagnosis of OA LEFT KNEE  The various methods of treatment have been discussed with the patient and family. After consideration of risks, benefits and other options for treatment, the patient has consented to  Procedure(s): TOTAL KNEE ARTHROPLASTY (Left) as a surgical intervention .  The patient's history has been reviewed, patient examined, no change in status, stable for surgery.  I have reviewed the patient's chart and labs.  Questions were answered to the patient's satisfaction.     Gearlean Alf

## 2015-06-08 NOTE — Anesthesia Procedure Notes (Signed)
Spinal Patient location during procedure: OR Start time: 06/08/2015 12:20 PM End time: 06/08/2015 12:32 PM Staffing Resident/CRNA: Alahna Dunne Performed by: resident/CRNA  Preanesthetic Checklist Completed: patient identified, site marked, surgical consent, pre-op evaluation, timeout performed, IV checked, risks and benefits discussed and monitors and equipment checked Spinal Block Patient position: sitting Prep: Betadine Patient monitoring: heart rate, cardiac monitor, continuous pulse ox and blood pressure Approach: midline Location: L3-4 Injection technique: single-shot Needle Needle type: Spinocan  Needle gauge: 22 G Needle length: 9 cm Needle insertion depth: 7 cm Assessment Sensory level: T6 Additional Notes -heme, -para, VSS Lot  [id number] Exp  2016-09-26

## 2015-06-09 ENCOUNTER — Encounter (HOSPITAL_COMMUNITY): Payer: Self-pay | Admitting: Orthopedic Surgery

## 2015-06-09 LAB — BASIC METABOLIC PANEL
Anion gap: 11 (ref 5–15)
BUN: 12 mg/dL (ref 6–20)
CALCIUM: 8.4 mg/dL — AB (ref 8.9–10.3)
CO2: 24 mmol/L (ref 22–32)
CREATININE: 0.78 mg/dL (ref 0.61–1.24)
Chloride: 101 mmol/L (ref 101–111)
GFR calc Af Amer: >60 mL/min (ref >60)
GLUCOSE: 192 mg/dL — AB (ref 65–99)
POTASSIUM: 3.8 mmol/L (ref 3.5–5.1)
SODIUM: 136 mmol/L (ref 135–145)

## 2015-06-09 LAB — CBC
HCT: 42.8 % (ref 39.0–52.0)
HEMOGLOBIN: 14.9 g/dL (ref 13.0–17.0)
MCH: 32.5 pg (ref 26.0–34.0)
MCHC: 34.8 g/dL (ref 30.0–36.0)
MCV: 93.2 fL (ref 78.0–100.0)
Platelets: 256 10*3/uL (ref 150–400)
RBC: 4.59 MIL/uL (ref 4.22–5.81)
RDW: 12.2 % (ref 11.5–15.5)
WBC: 20.4 10*3/uL — AB (ref 4.0–10.5)

## 2015-06-09 MED ORDER — OXYCODONE HCL 5 MG PO TABS: 5.0000 mg | ORAL_TABLET | ORAL | Status: DC | PRN

## 2015-06-09 MED ORDER — RIVAROXABAN 10 MG PO TABS: 10.0000 mg | ORAL_TABLET | Freq: Every day | ORAL | Status: DC

## 2015-06-09 MED ORDER — METHOCARBAMOL 500 MG PO TABS: 500.0000 mg | ORAL_TABLET | Freq: Four times a day (QID) | ORAL | Status: DC | PRN

## 2015-06-09 MED ORDER — TRAMADOL HCL 50 MG PO TABS: 50.0000 mg | ORAL_TABLET | Freq: Four times a day (QID) | ORAL | Status: DC | PRN

## 2015-06-09 MED ORDER — DEXAMETHASONE 6 MG PO TABS
10.0000 mg | ORAL_TABLET | Freq: Once | ORAL | Status: AC
  Administered 2015-06-09: 10 mg via ORAL
  Filled 2015-06-09: qty 1

## 2015-06-09 MED ORDER — HYDROCHLOROTHIAZIDE 12.5 MG PO CAPS
12.5000 mg | ORAL_CAPSULE | Freq: Every day | ORAL | Status: DC
  Administered 2015-06-09 – 2015-06-10 (×2): 12.5 mg via ORAL
  Filled 2015-06-09 (×2): qty 1

## 2015-06-09 MED ORDER — ALUM & MAG HYDROXIDE-SIMETH 200-200-20 MG/5ML PO SUSP
30.0000 mL | ORAL | Status: DC | PRN
Start: 2015-06-09 — End: 2015-06-10
  Administered 2015-06-10: 30 mL via ORAL
  Filled 2015-06-09: qty 30

## 2015-06-09 NOTE — Discharge Instructions (Addendum)
° °Dr. Frank Aluisio °Total Joint Specialist °[facility] °[address] °[place], [place] [address] °[phone] ° °TOTAL KNEE REPLACEMENT POSTOPERATIVE DIRECTIONS ° °Knee Rehabilitation, Guidelines Following Surgery  °Results after knee surgery are often greatly improved when you follow the exercise, range of motion and muscle strengthening exercises prescribed by your doctor. Safety measures are also important to protect the knee from further injury. Any time any of these exercises cause you to have increased pain or swelling in your knee joint, decrease the amount until you are comfortable again and slowly increase them. If you have problems or questions, call your caregiver or physical therapist for advice.  ° °HOME CARE INSTRUCTIONS  °Remove items at home which could result in a fall. This includes throw rugs or furniture in walking pathways.  °· ICE to the affected knee every three hours for 30 minutes at a time and then as needed for pain and swelling.  Continue to use ice on the knee for pain and swelling from surgery. You may notice swelling that will progress down to the foot and ankle.  This is normal after surgery.  Elevate the leg when you are not up walking on it.   °· Continue to use the breathing machine which will help keep your temperature down.  It is common for your temperature to cycle up and down following surgery, especially at night when you are not up moving around and exerting yourself.  The breathing machine keeps your lungs expanded and your temperature down. °· Do not place pillow under knee, focus on keeping the knee straight while resting ° °DIET °You may resume your previous home diet once your are discharged from the hospital. ° °DRESSING / WOUND CARE / SHOWERING °You may shower 3 days after surgery, but keep the wounds dry during showering.  You may use an occlusive plastic wrap (Press'n Seal for example), NO SOAKING/SUBMERGING IN THE BATHTUB.  If the  bandage gets wet, change with a clean dry gauze.  If the incision gets wet, pat the wound dry with a clean towel. °You may start showering once you are discharged home but do not submerge the incision under water. Just pat the incision dry and apply a dry gauze dressing on daily. °Change the surgical dressing daily and reapply a dry dressing each time. ° °ACTIVITY °Walk with your walker as instructed. °Use walker as long as suggested by your caregivers. °Avoid periods of inactivity such as sitting longer than an hour when not asleep. This helps prevent blood clots.  °You may resume a sexual relationship in one month or when given the OK by your doctor.  °You may return to work once you are cleared by your doctor.  °Do not drive a car for 6 weeks or until released by you surgeon.  °Do not drive while taking narcotics. ° °WEIGHT BEARING °Weight bearing as tolerated with assist device (walker, cane, etc) as directed, use it as long as suggested by your surgeon or therapist, typically at least 4-6 weeks. ° °POSTOPERATIVE CONSTIPATION PROTOCOL °Constipation - defined medically as fewer than three stools per week and severe constipation as less than one stool per week. ° °One of the most common issues patients have following surgery is constipation.  Even if you have a regular bowel pattern at home, your normal regimen is likely to be disrupted due to multiple reasons following surgery.  Combination of anesthesia, postoperative narcotics, change in appetite and fluid intake all can affect your bowels.    In order to avoid complications following surgery, here are some recommendations in order to help you during your recovery period. ° °Colace (docusate) - Pick up an over-the-counter form of Colace or another stool softener and take twice a day as long as you are requiring postoperative pain medications.  Take with a full glass of water daily.  If you experience loose stools or diarrhea, hold the colace until you stool forms  back up.  If your symptoms do not get better within 1 week or if they get worse, check with your doctor. ° °Dulcolax (bisacodyl) - Pick up over-the-counter and take as directed by the product packaging as needed to assist with the movement of your bowels.  Take with a full glass of water.  Use this product as needed if not relieved by Colace only.  ° °MiraLax (polyethylene glycol) - Pick up over-the-counter to have on hand.  MiraLax is a solution that will increase the amount of water in your bowels to assist with bowel movements.  Take as directed and can mix with a glass of water, juice, soda, coffee, or tea.  Take if you go more than two days without a movement. °Do not use MiraLax more than once per day. Call your doctor if you are still constipated or irregular after using this medication for 7 days in a row. ° °If you continue to have problems with postoperative constipation, please contact the office for further assistance and recommendations.  If you experience "the worst abdominal pain ever" or develop nausea or vomiting, please contact the office immediatly for further recommendations for treatment. ° °ITCHING ° If you experience itching with your medications, try taking only a single pain pill, or even half a pain pill at a time.  You can also use Benadryl over the counter for itching or also to help with sleep.  ° °TED HOSE STOCKINGS °Wear the elastic stockings on both legs for three weeks following surgery during the day but you may remove then at night for sleeping. ° °MEDICATIONS °See your medication summary on the “After Visit Summary” that the nursing staff will review with you prior to discharge.  You may have some home medications which will be placed on hold until you complete the course of blood thinner medication.  It is important for you to complete the blood thinner medication as prescribed by your surgeon.  Continue your approved medications as instructed at time of  discharge. ° °PRECAUTIONS °If you experience chest pain or shortness of breath - call 911 immediately for transfer to the hospital emergency department.  °If you develop a fever greater that 101 F, purulent drainage from wound, increased redness or drainage from wound, foul odor from the wound/dressing, or calf pain - CONTACT YOUR SURGEON.   °                                                °FOLLOW-UP APPOINTMENTS °Make sure you keep all of your appointments after your operation with your surgeon and caregivers. You should call the office at the above phone number and make an appointment for approximately two weeks after the date of your surgery or on the date instructed by your surgeon outlined in the "After Visit Summary". ° ° °RANGE OF MOTION AND STRENGTHENING EXERCISES  °Rehabilitation of the knee is important following a knee injury or   an operation. After just a few days of immobilization, the muscles of the thigh which control the knee become weakened and shrink (atrophy). Knee exercises are designed to build up the tone and strength of the thigh muscles and to improve knee motion. Often times heat used for twenty to thirty minutes before working out will loosen up your tissues and help with improving the range of motion but do not use heat for the first two weeks following surgery. These exercises can be done on a training (exercise) mat, on the floor, on a table or on a bed. Use what ever works the best and is most comfortable for you Knee exercises include:  °Leg Lifts - While your knee is still immobilized in a splint or cast, you can do straight leg raises. Lift the leg to 60 degrees, hold for 3 sec, and slowly lower the leg. Repeat 10-20 times 2-3 times daily. Perform this exercise against resistance later as your knee gets better.  °Quad and Hamstring Sets - Tighten up the muscle on the front of the thigh (Quad) and hold for 5-10 sec. Repeat this 10-20 times hourly. Hamstring sets are done by pushing the  foot backward against an object and holding for 5-10 sec. Repeat as with quad sets.  °· Leg Slides: Lying on your back, slowly slide your foot toward your buttocks, bending your knee up off the floor (only go as far as is comfortable). Then slowly slide your foot back down until your leg is flat on the floor again. °· Angel Wings: Lying on your back spread your legs to the side as far apart as you can without causing discomfort.  °A rehabilitation program following serious knee injuries can speed recovery and prevent re-injury in the future due to weakened muscles. Contact your doctor or a physical therapist for more information on knee rehabilitation.  ° °IF YOU ARE TRANSFERRED TO A SKILLED REHAB FACILITY °If the patient is transferred to a skilled rehab facility following release from the hospital, a list of the current medications will be sent to the facility for the patient to continue.  When discharged from the skilled rehab facility, please have the facility set up the patient's Home Health Physical Therapy prior to being released. Also, the skilled facility will be responsible for providing the patient with their medications at time of release from the facility to include their pain medication, the muscle relaxants, and their blood thinner medication. If the patient is still at the rehab facility at time of the two week follow up appointment, the skilled rehab facility will also need to assist the patient in arranging follow up appointment in our office and any transportation needs. ° °MAKE SURE YOU:  °Understand these instructions.  °Get help right away if you are not doing well or get worse.  ° ° °Pick up stool softner and laxative for home use following surgery while on pain medications. °Do not submerge incision under water. °Please use good hand washing techniques while changing dressing each day. °May shower starting three days after surgery. °Please use a clean towel to pat the incision dry following  showers. °Continue to use ice for pain and swelling after surgery. °Do not use any lotions or creams on the incision until instructed by your surgeon. ° °Take Xarelto for two and a half more weeks, then discontinue Xarelto. °Once the patient has completed the Xarelto, they may resume the 81 mg Aspirin. ° ° °Information on my medicine - XARELTO® (Rivaroxaban) ° °  This medication education was reviewed with me or my healthcare representative as part of my discharge preparation.   ° °Why was Xarelto® prescribed for you? °Xarelto® was prescribed for you to reduce the risk of blood clots forming after orthopedic surgery. The medical term for these abnormal blood clots is venous thromboembolism (VTE). ° °What do you need to know about xarelto® ? °Take your Xarelto® ONCE DAILY at the same time every day. °You may take it either with or without food. ° °If you have difficulty swallowing the tablet whole, you may crush it and mix in applesauce just prior to taking your dose. ° °Take Xarelto® exactly as prescribed by your doctor and DO NOT stop taking Xarelto® without talking to the doctor who prescribed the medication.  Stopping without other VTE prevention medication to take the place of Xarelto® may increase your risk of developing a clot. ° °After discharge, you should have regular check-up appointments with your healthcare provider that is prescribing your Xarelto®.   ° °What do you do if you miss a dose? °If you miss a dose, take it as soon as you remember on the same day then continue your regularly scheduled once daily regimen the next day. Do not take two doses of Xarelto® on the same day.  ° °Important Safety Information °A possible side effect of Xarelto® is bleeding. You should call your healthcare provider right away if you experience any of the following: °? Bleeding from an injury or your nose that does not stop. °? Unusual colored urine (red or dark brown) or unusual colored stools (red or black). °? Unusual  bruising for unknown reasons. °? A serious fall or if you hit your head (even if there is no bleeding). ° °Some medicines may interact with Xarelto® and might increase your risk of bleeding while on Xarelto®. To help avoid this, consult your healthcare provider or pharmacist prior to using any new prescription or non-prescription medications, including herbals, vitamins, non-steroidal anti-inflammatory drugs (NSAIDs) and supplements. ° °This website has more information on Xarelto®: www.xarelto.com. ° ° °

## 2015-06-09 NOTE — Progress Notes (Signed)
   Subjective: 1 Day Post-Op Procedure(s) (LRB): TOTAL KNEE ARTHROPLASTY (Left) STEROID INJECTION (Right) Patient reports pain as mild.   Patient seen in rounds with Dr. Wynelle Link.  Already up OOB with the staff. Patient is well, but has had some minor complaints of pain in the knee, requiring pain medications We will start therapy today.  Plan is to go Home after hospital stay.  Objective: Vital signs in last 24 hours: Temp:  [97.7 F (36.5 C)-100.5 F (38.1 C)] 98 F (36.7 C) (01/10 0546) Pulse Rate:  [65-99] 85 (01/10 0546) Resp:  [8-18] 18 (01/10 0546) BP: (122-176)/(67-90) 140/79 mmHg (01/10 0546) SpO2:  [96 %-100 %] 98 % (01/10 0546) Weight:  [107.049 kg (236 lb)] 107.049 kg (236 lb) (01/09 0942)  Intake/Output from previous day:  Intake/Output Summary (Last 24 hours) at 06/09/15 0749 Last data filed at 06/09/15 0549  Gross per 24 hour  Intake   3425 ml  Output   3690 ml  Net   -265 ml    Labs:  Recent Labs  06/09/15 0545  HGB 14.9    Recent Labs  06/09/15 0545  WBC 20.4*  RBC 4.59  HCT 42.8  PLT 256    Recent Labs  06/09/15 0545  NA 136  K 3.8  CL 101  CO2 24  BUN 12  CREATININE 0.78  GLUCOSE 192*  CALCIUM 8.4*   No results for input(s): LABPT, INR in the last 72 hours.  EXAM General - Patient is Alert, Appropriate and Oriented Extremity - Neurovascular intact Sensation intact distally Dressing - dressing C/D/I Motor Function - intact, moving foot and toes well on exam.  Hemovac pulled without difficulty.  Past Medical History  Diagnosis Date  . Excessive sweating   . Hypertension   . Heart murmur     "functional heart murmur"  . Arthritis   . History of skin cancer   . History of kidney stones   . Sleep apnea     uses c-pap  . Cancer ([place]) 2014    skin cancer left arm    Assessment/Plan: 1 Day Post-Op Procedure(s) (LRB): TOTAL KNEE ARTHROPLASTY (Left) STEROID INJECTION (Right) Principal Problem:   OA (osteoarthritis) of  knee  Estimated body mass index is 35.89 kg/(m^2) as calculated from the following:   Height as of this encounter: 5\' 8"  (1.727 m).   Weight as of this encounter: 107.049 kg (236 lb). Advance diet Up with therapy Plan for discharge tomorrow Discharge home with home health  DVT Prophylaxis - Xarelto Weight-Bearing as tolerated to left leg D/C O2 and Pulse OX and try on Room Air  Arlee Muslim, PA-C Orthopaedic Surgery 06/09/2015, 7:49 AM

## 2015-06-09 NOTE — Care Management Note (Signed)
Case Management Note  Patient Details  Name: Jeffrey Andrews MRN: [id number] Date of Birth: 22-Nov-1945  Subjective/Objective:     S/p Left Total Knee Arthroplasty, Right knee cortisone injection               Action/Plan: Discharge planning, spoke with patient and spouse at beside. Chose [facility] for [facility] services, contacted [facility] for referral. Has RW and 3-n-1 at home.   Expected Discharge Date:                  Expected Discharge Plan:  [facility]  In-House Referral:  NA  Discharge planning Services  CM Consult  Post Acute Care Choice:  Home Health Choice offered to:  Patient  DME Arranged:  N/A DME Agency:  NA  HH Arranged:  PT [place] Agency:  [place]  Status of Service:  Completed, signed off  Medicare Important Message Given:    Date Medicare IM Given:    Medicare IM give by:    Date Additional Medicare IM Given:    Additional Medicare Important Message give by:     If discussed at [place] of Stay Meetings, dates discussed:    Additional Comments:  Guadalupe Maple, RN 06/09/2015, 12:02 PM

## 2015-06-09 NOTE — Progress Notes (Signed)
Physical Therapy Treatment Patient Details Name: Jeffrey Andrews MRN: [id number] DOB: 07/08/1945 Today's Date: 06/09/2015    History of Present Illness L TKA, h/o L quad tendon rupture, back surgery x 3, sleep apnea    PT Comments    Pt progressing well with mobility, he ambulated 100' with RW and supervision. Will plan to do stair training tomorrow morning, then expect pt will be ready to DC home from PT standpoint.  Follow Up Recommendations  Home health PT     Equipment Recommendations  None recommended by PT    Recommendations for Other Services OT consult     Precautions / Restrictions Precautions Precautions: Fall;Knee Restrictions Weight Bearing Restrictions: No LLE Weight Bearing: Weight bearing as tolerated    Mobility  Bed Mobility Overal bed mobility: Modified Independent             General bed mobility comments: bed flat, used rail for supine to sit and sit to supine  Transfers Overall transfer level: Needs assistance Equipment used: Rolling walker (2 wheeled) Transfers: Sit to/from Stand Sit to Stand: Supervision         General transfer comment: verbal cues for hand placement  Ambulation/Gait Ambulation/Gait assistance: Supervision Ambulation Distance (Feet): 100 Feet Assistive device: Rolling walker (2 wheeled) Gait Pattern/deviations: Step-to pattern   Gait velocity interpretation: Below normal speed for age/gender General Gait Details: verbal cues to lift head and positioning in RW   Stairs            Wheelchair Mobility    Modified Rankin (Stroke Patients Only)       Balance Overall balance assessment: Modified Independent                                  Cognition Arousal/Alertness: Awake/alert Behavior During Therapy: WFL for tasks assessed/performed Overall Cognitive Status: Within Functional Limits for tasks assessed                      Exercises Total Joint Exercises Ankle  Circles/Pumps: AROM;Both;10 reps;Supine Heel Slides: AAROM;Left;10 reps;Supine Long Arc Quad: AAROM;10 reps;Seated Knee Flexion: AAROM;Left;10 reps;Seated Goniometric ROM: 10-60* AAROM L knee    General Comments        Pertinent Vitals/Pain Pain Assessment: 0-10 Pain Score: 3  Pain Location: L knee with walking Pain Descriptors / Indicators: Sore Pain Intervention(s): Limited activity within patient's tolerance;Monitored during session;Premedicated before session;Ice applied    Home Living                      Prior Function            PT Goals (current goals can now be found in the care plan section) Acute Rehab PT Goals Patient Stated Goal: to keep up with my 12 grandchildren PT Goal Formulation: With patient Time For Goal Achievement: 06/16/15 Potential to Achieve Goals: Good Progress towards PT goals: Progressing toward goals    Frequency  7X/week    PT Plan Current plan remains appropriate    Co-evaluation             End of Session Equipment Utilized During Treatment: Gait belt Activity Tolerance: Patient tolerated treatment well Patient left: with call bell/phone within reach;in bed     Time: [id number] PT Time Calculation (min) (ACUTE ONLY): 26 min  Charges:  $Gait Training: 8-22 mins $Therapeutic Exercise: 8-22 mins  G Codes:      Philomena Doheny 06/09/2015, 2:37 PM [phone]

## 2015-06-09 NOTE — Progress Notes (Signed)
Occupational Therapy Evaluation Patient Details Name: SHANG ADMIRE MRN: [id number] DOB: 17-Jan-1946 Today's Date: 06/09/2015    History of Present Illness L TKA, h/o L quad tendon rupture, back surgery x 3, sleep apnea   Clinical Impression   Patient presents at min A to supervision with BADLs and will have wife's assistance as needed. All OT education completed and no further OT needs. OT will sign off.    Follow Up Recommendations  No OT follow up;Supervision - Intermittent    Equipment Recommendations  Other (comment);None recommended by OT (pt plans to borrow [facility] from church)    Recommendations for Other Services       Precautions / Restrictions Precautions Precautions: Fall;Knee Restrictions Weight Bearing Restrictions: No LLE Weight Bearing: Weight bearing as tolerated      Mobility Bed Mobility                Transfers                 Balance                                           ADL Overall ADL's : Needs assistance/impaired Eating/Feeding: Independent;Sitting;Bed level   Grooming: Set up;Sitting   Upper Body Bathing: Set up;Sitting   Lower Body Bathing: Minimal assistance;Sit to/from stand   Upper Body Dressing : Set up;Sitting   Lower Body Dressing: Minimal assistance;Sit to/from stand   Toilet Transfer: Supervision/safety;Ambulation;RW   Toileting- Clothing Manipulation and Hygiene: Supervision/safety;Sit to/from stand       Functional mobility during ADLs: Supervision/safety;Rolling walker General ADL Comments: Patient received dressed in street clothes and hospital socks. Reports he was able to don shirt and shorts without difficulty and described proper technique to OT. Needs assistance with socks. Wife will be able to help him as needed at home. Reports he toileted earlier and had no difficulty getting on/off regular height commode but plans to borrow [facility] from his church. Provided verbal education on  tub/shower transfer technique and patient verbalized understanding but declined to practice. All OT education completed.      Vision     Perception     Praxis      Pertinent Vitals/Pain Pain Assessment: 0-10 Pain Score: 3  Pain Location: L knee Pain Descriptors / Indicators: Aching;Sore Pain Intervention(s): Limited activity within patient's tolerance;Monitored during session     Hand Dominance Right   Extremity/Trunk Assessment Upper Extremity Assessment Upper Extremity Assessment: Overall WFL for tasks assessed   Lower Extremity Assessment Lower Extremity Assessment: Defer to PT evaluation       Communication Communication Communication: No difficulties   Cognition Arousal/Alertness: Awake/alert Behavior During Therapy: WFL for tasks assessed/performed Overall Cognitive Status: Within Functional Limits for tasks assessed                     General Comments       Exercises       Shoulder Instructions      Home Living Family/patient expects to be discharged to:: Private residence Living Arrangements: Spouse/significant other Available Help at Discharge: Family;Available 24 hours/day Type of Home: House Home Access: Stairs to enter [employer] of Steps: 3+3 Entrance Stairs-Rails: Left Home Layout: One level     Bathroom Shower/Tub: Teacher, Jaclyn Carew years/pre: Standard Bathroom Accessibility: Yes How Accessible: Accessible via walker Home Equipment: [place] - 2 wheels;Cane -  single point   Additional Comments: can borrow BSC from church      Prior Functioning/Environment Level of Independence: Independent             OT Diagnosis: Acute pain   OT Problem List: Decreased strength;Decreased range of motion;Pain   OT Treatment/Interventions:      OT Goals(Current goals can be found in the care plan section) Acute Rehab OT Goals Patient Stated Goal: to go home tomorrow OT Goal Formulation: All assessment and  education complete, DC therapy  OT Frequency:     Barriers to D/C:            Co-evaluation              End of Session   Activity Tolerance: Patient tolerated treatment well Patient left: in bed;with call bell/phone within reach   Time: [id number] OT Time Calculation (min): 13 min Charges:  OT General Charges $OT Visit: 1 Procedure OT Evaluation $OT Eval Low Complexity: 1 Procedure G-Codes:    Gesenia Bantz A 06-17-15, 4:11 PM

## 2015-06-09 NOTE — Evaluation (Signed)
Physical Therapy Evaluation Patient Details Name: Jeffrey Andrews MRN: [id number] DOB: November 16, 1945 Today's Date: 06/09/2015   History of Present Illness  L TKA, h/o L quad tendon rupture, back surgery x 3, sleep apnea  Clinical Impression  Pt is s/p TKA resulting in the deficits listed below (see PT Problem List). Pt ambulated 6' with RW and supervision, instructed pt in TKA exercises. Good progress expected. Will do stair training tomorrow morning.  Pt will benefit from skilled PT to increase their independence and safety with mobility to allow discharge to the venue listed below.      Follow Up Recommendations Home health PT    Equipment Recommendations  None recommended by PT    Recommendations for Other Services OT consult     Precautions / Restrictions Precautions Precautions: Fall;Knee Restrictions Weight Bearing Restrictions: Yes LLE Weight Bearing: Weight bearing as tolerated      Mobility  Bed Mobility Overal bed mobility: Modified Independent             General bed mobility comments: HOB up 20*  Transfers Overall transfer level: Needs assistance Equipment used: Rolling walker (2 wheeled) Transfers: Sit to/from Stand Sit to Stand: Min guard         General transfer comment: verbal cues for hand placement  Ambulation/Gait Ambulation/Gait assistance: Supervision Ambulation Distance (Feet): 60 Feet Assistive device: Rolling walker (2 wheeled) Gait Pattern/deviations: Step-to pattern;Decreased step length - right;Decreased step length - left   Gait velocity interpretation: Below normal speed for age/gender General Gait Details: verbal cues for sequencing, to lift head and positioning in RW  Stairs            Wheelchair Mobility    Modified Rankin (Stroke Patients Only)       Balance Overall balance assessment: Modified Independent                                           Pertinent Vitals/Pain Pain Assessment:  0-10 Pain Score: 3  Pain Location: L knee with walking, 0/10 at rest Pain Descriptors / Indicators: Sore Pain Intervention(s): Monitored during session;Ice applied;Limited activity within patient's tolerance;Repositioned;Premedicated before session    [place] expects to be discharged to:: Private residence Living Arrangements: Spouse/significant other Available Help at Discharge: Family;Available 24 hours/day Type of Home: House Home Access: Stairs to enter Entrance Stairs-Rails: Left Entrance Stairs-Number of Steps: 3+3 Home Layout: One level Home Equipment: Walker - 2 wheels;Cane - single point Additional Comments: can borrow BSC from church    Prior Function Level of Independence: Independent               Hand Dominance        Extremity/Trunk Assessment               Lower Extremity Assessment: LLE deficits/detail   LLE Deficits / Details: SLR 3/5, knee flexion AAROM 50*, ext -10* AAROM, ankle WNL     Communication   Communication: No difficulties  Cognition Arousal/Alertness: Awake/alert Behavior During Therapy: WFL for tasks assessed/performed Overall Cognitive Status: Within Functional Limits for tasks assessed                      General Comments      Exercises Total Joint Exercises Ankle Circles/Pumps: AROM;Both;10 reps;Supine Quad Sets: AROM;Left;10 reps;Supine Heel Slides: AAROM;Left;10 reps;Supine Long Arc Quad: AAROM;10 reps;Seated Knee Flexion: AAROM;Left;10 reps;Seated  Goniometric ROM: 10-50* L knee AAROM      Assessment/Plan    PT Assessment Patient needs continued PT services  PT Diagnosis Difficulty walking;Acute pain   PT Problem List Decreased strength;Decreased range of motion;Decreased activity tolerance;Pain;Decreased knowledge of use of DME;Decreased mobility  PT Treatment Interventions DME instruction;Stair training;Functional mobility training;Therapeutic activities;Patient/family  education;Gait training;Therapeutic exercise   PT Goals (Current goals can be found in the Care Plan section) Acute Rehab PT Goals Patient Stated Goal: to keep up with my 12 grandchildren PT Goal Formulation: With patient Time For Goal Achievement: 06/16/15 Potential to Achieve Goals: Good    Frequency 7X/week   Barriers to discharge        Co-evaluation               End of Session Equipment Utilized During Treatment: Gait belt Activity Tolerance: Patient tolerated treatment well Patient left: in chair;with call bell/phone within reach Nurse Communication: Mobility status         Time: 1013-1040 PT Time Calculation (min) (ACUTE ONLY): 27 min   Charges:   PT Evaluation $PT Eval Low Complexity: 1 Procedure PT Treatments $Gait Training: 8-22 mins   PT G Codes:        Philomena Doheny 06/09/2015, 10:48 AM [phone]

## 2015-06-09 NOTE — Discharge Summary (Signed)
Physician Discharge Summary   Patient ID: Jeffrey Andrews MRN: [id number] DOB/AGE: Aug 18, 1945 70 y.o.  Admit date: 06/08/2015 Discharge date: 06-10-2015  Primary Diagnosis:  Osteoarthritis Bilateral knee(s)  Admission Diagnoses:  Past Medical History  Diagnosis Date  . Excessive sweating   . Hypertension   . Heart murmur     "functional heart murmur"  . Arthritis   . History of skin cancer   . History of kidney stones   . Sleep apnea     uses c-pap  . Cancer ([facility]) 2014    skin cancer left arm   Discharge Diagnoses:   Principal Problem:   OA (osteoarthritis) of knee  Estimated body mass index is 35.89 kg/(m^2) as calculated from the following:   Height as of this encounter: 5' 8"  (1.727 m).   Weight as of this encounter: 107.049 kg (236 lb).  Procedure:  Procedure(s) (LRB): TOTAL KNEE ARTHROPLASTY (Left) STEROID INJECTION (Right)   Consults: None  HPI: Jeffrey Andrews is a 70 y.o. year old male with end stage OA of his left knee with progressively worsening pain and dysfunction. He has constant pain, with activity and at rest and significant functional deficits with difficulties even with ADLs. He has had extensive non-op management including analgesics, injections of cortisone, and home exercise program, but remains in significant pain with significant dysfunction. Radiographs show bone on bone arthritis medial and patellofemoral with varus deformity. He presents now for left Total Knee Arthroplasty. He also has OA of the right knee and requests cortisone injection.  Laboratory Data: Admission on 06/08/2015  Component Date Value Ref Range Status  . WBC 06/09/2015 20.4* 4.0 - 10.5 K/uL Final  . RBC 06/09/2015 4.59  4.22 - 5.81 MIL/uL Final  . Hemoglobin 06/09/2015 14.9  13.0 - 17.0 g/dL Final  . HCT 06/09/2015 42.8  39.0 - 52.0 % Final  . MCV 06/09/2015 93.2  78.0 - 100.0 fL Final  . MCH 06/09/2015 32.5  26.0 - 34.0 pg Final  . MCHC 06/09/2015 34.8  30.0 - 36.0 g/dL  Final  . RDW 06/09/2015 12.2  11.5 - 15.5 % Final  . Platelets 06/09/2015 256  150 - 400 K/uL Final  . Sodium 06/09/2015 136  135 - 145 mmol/L Final  . Potassium 06/09/2015 3.8  3.5 - 5.1 mmol/L Final  . Chloride 06/09/2015 101  101 - 111 mmol/L Final  . CO2 06/09/2015 24  22 - 32 mmol/L Final  . Glucose, Bld 06/09/2015 192* 65 - 99 mg/dL Final  . BUN 06/09/2015 12  6 - 20 mg/dL Final  . Creatinine, Ser 06/09/2015 0.78  0.61 - 1.24 mg/dL Final  . Calcium 06/09/2015 8.4* 8.9 - 10.3 mg/dL Final  . GFR calc non Af Amer 06/09/2015 >60  >60 mL/min Final  . GFR calc Af Amer 06/09/2015 >60  >60 mL/min Final   Comment: (NOTE) The eGFR has been calculated using the CKD EPI equation. This calculation has not been validated in all clinical situations. eGFR's persistently <60 mL/min signify possible Chronic Kidney Disease.   Georgiann Hahn gap 06/09/2015 11  5 - 15 Final  Hospital Outpatient Visit on 06/02/2015  Component Date Value Ref Range Status  . aPTT 06/02/2015 31  24 - 37 seconds Final  . WBC 06/02/2015 10.2  4.0 - 10.5 K/uL Final  . RBC 06/02/2015 4.95  4.22 - 5.81 MIL/uL Final  . Hemoglobin 06/02/2015 16.1  13.0 - 17.0 g/dL Final  . HCT 06/02/2015 46.2  39.0 - 52.0 %  Final  . MCV 06/02/2015 93.3  78.0 - 100.0 fL Final  . MCH 06/02/2015 32.5  26.0 - 34.0 pg Final  . MCHC 06/02/2015 34.8  30.0 - 36.0 g/dL Final  . RDW 06/02/2015 12.3  11.5 - 15.5 % Final  . Platelets 06/02/2015 222  150 - 400 K/uL Final  . Sodium 06/02/2015 141  135 - 145 mmol/L Final  . Potassium 06/02/2015 3.8  3.5 - 5.1 mmol/L Final  . Chloride 06/02/2015 103  101 - 111 mmol/L Final  . CO2 06/02/2015 27  22 - 32 mmol/L Final  . Glucose, Bld 06/02/2015 120* 65 - 99 mg/dL Final  . BUN 06/02/2015 15  6 - 20 mg/dL Final  . Creatinine, Ser 06/02/2015 0.85  0.61 - 1.24 mg/dL Final  . Calcium 06/02/2015 9.1  8.9 - 10.3 mg/dL Final  . Total Protein 06/02/2015 7.1  6.5 - 8.1 g/dL Final  . Albumin 06/02/2015 4.5  3.5 - 5.0  g/dL Final  . AST 06/02/2015 22  15 - 41 U/L Final  . ALT 06/02/2015 12* 17 - 63 U/L Final  . Alkaline Phosphatase 06/02/2015 46  38 - 126 U/L Final  . Total Bilirubin 06/02/2015 0.9  0.3 - 1.2 mg/dL Final  . GFR calc non Af Amer 06/02/2015 >60  >60 mL/min Final  . GFR calc Af Amer 06/02/2015 >60  >60 mL/min Final   Comment: (NOTE) The eGFR has been calculated using the CKD EPI equation. This calculation has not been validated in all clinical situations. eGFR's persistently <60 mL/min signify possible Chronic Kidney Disease.   . Anion gap 06/02/2015 11  5 - 15 Final  . Prothrombin Time 06/02/2015 13.7  11.6 - 15.2 seconds Final  . INR 06/02/2015 1.03  0.00 - 1.49 Final  . ABO/RH(D) 06/02/2015 A POS   Final  . Antibody Screen 06/02/2015 NEG   Final  . Sample Expiration 06/02/2015 06/11/2015   Final  . Extend sample reason 06/02/2015 NO TRANSFUSIONS OR PREGNANCY IN THE PAST 3 MONTHS   Final  . Color, Urine 06/02/2015 YELLOW  YELLOW Final  . APPearance 06/02/2015 CLEAR  CLEAR Final  . Specific Gravity, Urine 06/02/2015 1.014  1.005 - 1.030 Final  . pH 06/02/2015 7.5  5.0 - 8.0 Final  . Glucose, UA 06/02/2015 NEGATIVE  NEGATIVE mg/dL Final  . Hgb urine dipstick 06/02/2015 NEGATIVE  NEGATIVE Final  . Bilirubin Urine 06/02/2015 NEGATIVE  NEGATIVE Final  . Ketones, ur 06/02/2015 NEGATIVE  NEGATIVE mg/dL Final  . Protein, ur 06/02/2015 NEGATIVE  NEGATIVE mg/dL Final  . Nitrite 06/02/2015 NEGATIVE  NEGATIVE Final  . Leukocytes, UA 06/02/2015 NEGATIVE  NEGATIVE Final   MICROSCOPIC NOT DONE ON URINES WITH NEGATIVE PROTEIN, BLOOD, LEUKOCYTES, NITRITE, OR GLUCOSE <1000 mg/dL.  Marland Kitchen MRSA, PCR 06/02/2015 NEGATIVE  NEGATIVE Final  . Staphylococcus aureus 06/02/2015 POSITIVE* NEGATIVE Final   Comment:        The Xpert SA Assay (FDA approved for NASAL specimens in patients over 56 years of age), is one component of a comprehensive surveillance program.  Test performance has been validated by  [facility] for patients greater than or equal to 82 year old. It is not intended to diagnose infection nor to guide or monitor treatment.   . ABO/RH(D) 06/02/2015 A POS   Final     X-Rays:No results found.  EKG:No orders found for this or any previous visit.   Hospital Course: Jeffrey Andrews is a 70 y.o. who was admitted to [facility]. They were brought to the operating room on 06/08/2015 and underwent Procedure(s): TOTAL KNEE ARTHROPLASTY STEROID INJECTION.  Patient tolerated the procedure well and was later transferred to the recovery room and then to the orthopaedic floor for postoperative care.  They were given PO and IV analgesics for pain control following their surgery.  They were given 24 hours of postoperative antibiotics of  Anti-infectives    Start     Dose/Rate Route Frequency Ordered Stop   06/08/15 1830  ceFAZolin (ANCEF) IVPB 2 g/50 mL premix     2 g 100 mL/hr over 30 Minutes Intravenous Every 6 hours 06/08/15 1702 06/09/15 0220   06/08/15 0957  ceFAZolin (ANCEF) IVPB 2 g/50 mL premix     2 g 100 mL/hr over 30 Minutes Intravenous On call to O.R. 06/08/15 0957 06/08/15 1235     and started on DVT prophylaxis in the form of Xarelto.   PT and OT were ordered for total joint protocol.  Discharge planning consulted to help with postop disposition and equipment needs.  Patient had a decent night on the evening of surgery.  They started to get up OOB with therapy on day one. Hemovac drain was pulled without difficulty.  Continued to work with therapy into day two.  Dressing was changed on day two and the incision was healing well.   Patient was seen in rounds and was ready to go home.  Discharge home with home health Diet - Cardiac diet Follow up - in 2 weeks Activity - WBAT Disposition - Home Condition Upon Discharge - Good D/C Meds - See DC Summary DVT Prophylaxis - Xarelto  Discharge Instructions    Call MD / Call 911    Complete by:  As directed   If you  experience chest pain or shortness of breath, CALL 911 and be transported to the hospital emergency room.  If you develope a fever above 101 F, pus (white drainage) or increased drainage or redness at the wound, or calf pain, call your surgeon's office.     Change dressing    Complete by:  As directed   Change dressing daily with sterile 4 x 4 inch gauze dressing and apply TED hose. Do not submerge the incision under water.     Constipation Prevention    Complete by:  As directed   Drink plenty of fluids.  Prune juice may be helpful.  You may use a stool softener, such as Colace (over the counter) 100 mg twice a day.  Use MiraLax (over the counter) for constipation as needed.     Diet - low sodium heart healthy    Complete by:  As directed      Discharge instructions    Complete by:  As directed   Pick up stool softner and laxative for home use following surgery while on pain medications. Do not submerge incision under water. Please use good hand washing techniques while changing dressing each day. May shower starting three days after surgery. Please use a clean towel to pat the incision dry following showers. Continue to use ice for pain and swelling after surgery. Do not use any lotions or creams on the incision until instructed by your surgeon.  Take Xarelto for two and a half more weeks, then discontinue Xarelto. Once the patient has completed the Xarelto, they may resume the 81 mg Aspirin.  Postoperative Constipation Protocol  Constipation - defined medically as fewer than three stools per week and severe constipation as  less than one stool per week.  One of the most common issues patients have following surgery is constipation.  Even if you have a regular bowel pattern at home, your normal regimen is likely to be disrupted due to multiple reasons following surgery.  Combination of anesthesia, postoperative narcotics, change in appetite and fluid intake all can affect your bowels.  In  order to avoid complications following surgery, here are some recommendations in order to help you during your recovery period.  Colace (docusate) - Pick up an over-the-counter form of Colace or another stool softener and take twice a day as long as you are requiring postoperative pain medications.  Take with a full glass of water daily.  If you experience loose stools or diarrhea, hold the colace until you stool forms back up.  If your symptoms do not get better within 1 week or if they get worse, check with your doctor.  Dulcolax (bisacodyl) - Pick up over-the-counter and take as directed by the product packaging as needed to assist with the movement of your bowels.  Take with a full glass of water.  Use this product as needed if not relieved by Colace only.   MiraLax (polyethylene glycol) - Pick up over-the-counter to have on hand.  MiraLax is a solution that will increase the amount of water in your bowels to assist with bowel movements.  Take as directed and can mix with a glass of water, juice, soda, coffee, or tea.  Take if you go more than two days without a movement. Do not use MiraLax more than once per day. Call your doctor if you are still constipated or irregular after using this medication for 7 days in a row.  If you continue to have problems with postoperative constipation, please contact the office for further assistance and recommendations.  If you experience "the worst abdominal pain ever" or develop nausea or vomiting, please contact the office immediatly for further recommendations for treatment.     Do not put a pillow under the knee. Place it under the heel.    Complete by:  As directed      Do not sit on low chairs, stoools or toilet seats, as it may be difficult to get up from low surfaces    Complete by:  As directed      Driving restrictions    Complete by:  As directed   No driving until released by the physician.     Increase activity slowly as tolerated    Complete by:   As directed      Lifting restrictions    Complete by:  As directed   No lifting until released by the physician.     Patient may shower    Complete by:  As directed   You may shower without a dressing once there is no drainage.  Do not wash over the wound.  If drainage remains, do not shower until drainage stops.     TED hose    Complete by:  As directed   Use stockings (TED hose) for 3 weeks on both leg(s).  You may remove them at night for sleeping.     Weight bearing as tolerated    Complete by:  As directed   Laterality:  left  Extremity:  Lower            Medication List    STOP taking these medications        aspirin 81 MG tablet  ibuprofen 200 MG tablet  Commonly known as:  ADVIL,MOTRIN     multivitamin tablet      TAKE these medications        hydrochlorothiazide 25 MG tablet  Commonly known as:  HYDRODIURIL  Take 12.5 mg by mouth daily. Takes 1/2 tablet     methocarbamol 500 MG tablet  Commonly known as:  ROBAXIN  Take 1 tablet (500 mg total) by mouth every 6 (six) hours as needed for muscle spasms.     oxyCODONE 5 MG immediate release tablet  Commonly known as:  Oxy IR/ROXICODONE  Take 1-2 tablets (5-10 mg total) by mouth every 3 (three) hours as needed for moderate pain or severe pain.     rivaroxaban 10 MG Tabs tablet  Commonly known as:  XARELTO  Take 1 tablet (10 mg total) by mouth daily with breakfast. Take Xarelto for two and a half more weeks, then discontinue Xarelto. Once the patient has completed the Xarelto, they may resume the 81 mg Aspirin.     simvastatin 40 MG tablet  Commonly known as:  ZOCOR  Take 20 mg by mouth daily. Takes 1/2 tablet     traMADol 50 MG tablet  Commonly known as:  ULTRAM  Take 1-2 tablets (50-100 mg total) by mouth every 6 (six) hours as needed (mild pain).           Follow-up Information    Follow up with [facility].   Why:  physical therapy   Contact information:   [address] [place] [address] [phone]       Follow up with Gearlean Alf, MD. Schedule an appointment as soon as possible for a visit on 06/23/2015.   Specialty:  Orthopedic Surgery   Why:  Call office at [phone] to setup appointment on Tuesday 06/23/2015 with Dr. Wynelle Link.   Contact information:   [address] [place] [address] [phone]       Signed: Arlee Muslim, PA-C Orthopaedic Surgery 06/09/2015, 10:35 PM

## 2015-06-10 LAB — BASIC METABOLIC PANEL
ANION GAP: 8 (ref 5–15)
BUN: 15 mg/dL (ref 6–20)
CO2: 28 mmol/L (ref 22–32)
Calcium: 8.8 mg/dL — ABNORMAL LOW (ref 8.9–10.3)
Chloride: 104 mmol/L (ref 101–111)
Creatinine, Ser: 0.91 mg/dL (ref 0.61–1.24)
Glucose, Bld: 202 mg/dL — ABNORMAL HIGH (ref 65–99)
POTASSIUM: 3.9 mmol/L (ref 3.5–5.1)
SODIUM: 140 mmol/L (ref 135–145)

## 2015-06-10 LAB — CBC
HCT: 37.4 % — ABNORMAL LOW (ref 39.0–52.0)
Hemoglobin: 12.9 g/dL — ABNORMAL LOW (ref 13.0–17.0)
MCH: 32 pg (ref 26.0–34.0)
MCHC: 34.5 g/dL (ref 30.0–36.0)
MCV: 92.8 fL (ref 78.0–100.0)
PLATELETS: 235 10*3/uL (ref 150–400)
RBC: 4.03 MIL/uL — AB (ref 4.22–5.81)
RDW: 12.3 % (ref 11.5–15.5)
WBC: 20.8 10*3/uL — AB (ref 4.0–10.5)

## 2015-06-10 NOTE — Progress Notes (Signed)
Physical Therapy Treatment Patient Details Name: Jeffrey Andrews MRN: [id number] DOB: 24-Sep-1945 Today's Date: 06/10/2015    History of Present Illness L TKA, h/o L quad tendon rupture, back surgery x 3, sleep apnea    PT Comments    POD # 2 am session.  Pt progressing well. Performing TKR TE's when I entered room.  Assisted with amb in hallway and practiced stairs.  Returned to room to finish TE's while instructing on proper tech and freq as well as use of ICE.  All mobility questions addressed.  Pt ready for D/C to home.   Follow Up Recommendations  Home health PT     Equipment Recommendations  None recommended by PT    Recommendations for Other Services       Precautions / Restrictions Precautions Precautions: Fall;Knee Restrictions Weight Bearing Restrictions: No LLE Weight Bearing: Weight bearing as tolerated    Mobility  Bed Mobility Overal bed mobility: Modified Independent                Transfers Overall transfer level: Modified independent Equipment used: Rolling walker (2 wheeled) Transfers: Sit to/from Stand           General transfer comment: good safety cognition  Ambulation/Gait Ambulation/Gait assistance: Supervision Ambulation Distance (Feet): 20 Feet Assistive device: Rolling walker (2 wheeled) Gait Pattern/deviations: Step-to pattern Gait velocity: WFL   General Gait Details: one VC safety with turns   Stairs Stairs: Yes Stairs assistance: Min guard Stair Management: One rail Left;Step to pattern;Forwards Number of Stairs: 2 General stair comments: one initial VC for proper sequencing   Wheelchair Mobility    Modified Rankin (Stroke Patients Only)       Balance                                    Cognition                            Exercises   Total Knee Replacement TE's 10 reps B LE ankle pumps 10 reps towel squeezes 10 reps knee presses 10 reps heel slides  10 reps SAQ's 10 reps  SLR's 10 reps ABD Followed by ICE     General Comments        Pertinent Vitals/Pain Pain Assessment: 0-10 Pain Score: 3  Pain Location: L knee Pain Descriptors / Indicators: Aching;Sore Pain Intervention(s): Monitored during session;Premedicated before session;Repositioned;Ice applied    Home Living                      Prior Function            PT Goals (current goals can now be found in the care plan section)      Frequency  7X/week    PT Plan Current plan remains appropriate    Co-evaluation             End of Session Equipment Utilized During Treatment: Gait belt Activity Tolerance: Patient tolerated treatment well Patient left: with call bell/phone within reach;with family/visitor present;in bed     Time: [id number] PT Time Calculation (min) (ACUTE ONLY): 40 min  Charges:  $Gait Training: 8-22 mins $Therapeutic Exercise: 8-22 mins $Therapeutic Activity: 8-22 mins                    G Codes:      Rica Koyanagi  PTA [facility]  Acute  Rehab Pager      [phone]

## 2015-06-10 NOTE — Progress Notes (Signed)
   Subjective: 2 Days Post-Op Procedure(s) (LRB): TOTAL KNEE ARTHROPLASTY (Left) STEROID INJECTION (Right) Patient reports pain as mild.   Patient seen in rounds with Dr. Wynelle Link.  Slept well last night. Patient is well, and has had no acute complaints or problems Patient is ready to go home after therapy.  Objective: Vital signs in last 24 hours: Temp:  [98.1 F (36.7 C)-98.8 F (37.1 C)] 98.3 F (36.8 C) (01/11 0605) Pulse Rate:  [79-103] 79 (01/11 0605) Resp:  [16-18] 18 (01/11 0605) BP: (138-156)/(66-75) 143/74 mmHg (01/11 0605) SpO2:  [96 %-98 %] 98 % (01/11 0605)  Intake/Output from previous day:  Intake/Output Summary (Last 24 hours) at 06/10/15 0802 Last data filed at 06/10/15 0510  Gross per 24 hour  Intake    960 ml  Output   3650 ml  Net  -2690 ml      Labs:  Recent Labs  06/09/15 0545 06/10/15 0455  HGB 14.9 12.9*    Recent Labs  06/09/15 0545 06/10/15 0455  WBC 20.4* 20.8*  RBC 4.59 4.03*  HCT 42.8 37.4*  PLT 256 235    Recent Labs  06/09/15 0545 06/10/15 0455  NA 136 140  K 3.8 3.9  CL 101 104  CO2 24 28  BUN 12 15  CREATININE 0.78 0.91  GLUCOSE 192* 202*  CALCIUM 8.4* 8.8*   No results for input(s): LABPT, INR in the last 72 hours.  EXAM: General - Patient is Alert, Appropriate and Oriented Extremity - Neurovascular intact Sensation intact distally Dorsiflexion/Plantar flexion intact Incision - clean, dry, no drainage, healing Motor Function - intact, moving foot and toes well on exam.   Assessment/Plan: 2 Days Post-Op Procedure(s) (LRB): TOTAL KNEE ARTHROPLASTY (Left) STEROID INJECTION (Right) Procedure(s) (LRB): TOTAL KNEE ARTHROPLASTY (Left) STEROID INJECTION (Right) Past Medical History  Diagnosis Date  . Excessive sweating   . Hypertension   . Heart murmur     "functional heart murmur"  . Arthritis   . History of skin cancer   . History of kidney stones   . Sleep apnea     uses c-pap  . Cancer ([place]) 2014      skin cancer left arm   Principal Problem:   OA (osteoarthritis) of knee  Estimated body mass index is 35.89 kg/(m^2) as calculated from the following:   Height as of this encounter: 5\' 8"  (1.727 m).   Weight as of this encounter: 107.049 kg (236 lb). Up with therapy Discharge home with home health Diet - Cardiac diet Follow up - in 2 weeks Activity - WBAT Disposition - Home Condition Upon Discharge - Good D/C Meds - See DC Summary DVT Prophylaxis - Xarelto  Arlee Muslim, PA-C Orthopaedic Surgery 06/10/2015, 8:02 AM

## 2015-10-28 NOTE — Patient Instructions (Addendum)
Jeffrey Andrews  10/28/2015   Your procedure is scheduled on: 11/09/15  Report to [facility] Main  Entrance take [place]  elevators to 3rd floor to [place] at 5:15 AM.  Call this number if you have problems the morning of surgery [phone]   Remember: ONLY 1 PERSON MAY GO WITH YOU TO SHORT STAY TO GET  READY MORNING OF [name].  Do not eat food or drink liquids :After Midnight.     Take these medicines the morning of surgery with A SIP OF WATER: Simvastatin   Bring CPAP mask and tubing morning of surgery.                                You may not have any metal on your body including hair pins and              piercings  Do not wear jewelry,  lotions, powders, cologne or deodorant             Do not shave  48 hours prior to surgery.              Men may shave face and neck.   Do not bring valuables to the hospital. [place].  Contacts, dentures or bridgework may not be worn into surgery.  Leave suitcase in the car. After surgery it may be brought to your room.     _____________________________________________________________________  [facility] - Preparing for Surgery Before surgery, you can play an important role.  Because skin is not sterile, your skin needs to be as free of germs as possible.  You can reduce the number of germs on your skin by washing with CHG (chlorahexidine gluconate) soap before surgery.  CHG is an antiseptic cleaner which kills germs and bonds with the skin to continue killing germs even after washing. Please DO NOT use if you have an allergy to CHG or antibacterial soaps.  If your skin becomes reddened/irritated stop using the CHG and inform your nurse when you arrive at Short Stay. Do not shave (including legs and underarms) for at least 48 hours prior to the first CHG shower.  You may shave your face/neck. Please follow these instructions carefully:  1.  Shower with  CHG Soap the night before surgery and the  morning of Surgery.  2.  If you choose to wash your hair, wash your hair first as usual with your  normal  shampoo.  3.  After you shampoo, rinse your hair and body thoroughly to remove the  shampoo.                             4.  Use CHG as you would any other liquid soap.  You can apply chg directly  to the skin and wash                       Gently with a scrungie or clean washcloth.  5.  Apply the CHG Soap to your body ONLY FROM THE NECK DOWN.   Do not use on face/ open  Wound or open sores. Avoid contact with eyes, ears mouth and genitals (private parts).                       Wash face,  Genitals (private parts) with your normal soap.             6.  Wash thoroughly, paying special attention to the area where your surgery  will be performed.  7.  Thoroughly rinse your body with warm water from the neck down.  8.  DO NOT shower/wash with your normal soap after using and rinsing off  the CHG Soap.                9.  Pat yourself dry with a clean towel.            10.  Wear clean pajamas.            11.  Place clean sheets on your bed the night of your first shower and do not  sleep with pets. Day of Surgery : Do not apply any lotions/deodorants the morning of surgery.  Please wear clean clothes to the hospital/surgery center.  FAILURE TO FOLLOW THESE INSTRUCTIONS MAY RESULT IN THE CANCELLATION OF YOUR SURGERY PATIENT SIGNATURE_________________________________  NURSE SIGNATURE__________________________________  ________________________________________________________________________   Jeffrey Andrews  An incentive spirometer is a tool that can help keep your lungs clear and active. This tool measures how well you are filling your lungs with each breath. Taking long deep breaths may help reverse or decrease the chance of developing breathing (pulmonary) problems (especially infection) following:  A long period of  time when you are unable to move or be active. BEFORE THE PROCEDURE   If the spirometer includes an indicator to show your best effort, your nurse or respiratory therapist will set it to a desired goal.  If possible, sit up straight or lean slightly forward. Try not to slouch.  Hold the incentive spirometer in an upright position. INSTRUCTIONS FOR USE   Sit on the edge of your bed if possible, or sit up as far as you can in bed or on a chair.  Hold the incentive spirometer in an upright position.  Breathe out normally.  Place the mouthpiece in your mouth and seal your lips tightly around it.  Breathe in slowly and as deeply as possible, raising the piston or the ball toward the top of the column.  Hold your breath for 3-5 seconds or for as long as possible. Allow the piston or ball to fall to the bottom of the column.  Remove the mouthpiece from your mouth and breathe out normally.  Rest for a few seconds and repeat Steps 1 through 7 at least 10 times every 1-2 hours when you are awake. Take your time and take a few normal breaths between deep breaths.  The spirometer may include an indicator to show your best effort. Use the indicator as a goal to work toward during each repetition.  After each set of 10 deep breaths, practice coughing to be sure your lungs are clear. If you have an incision (the cut made at the time of surgery), support your incision when coughing by placing a pillow or rolled up towels firmly against it. Once you are able to get out of bed, walk around indoors and cough well. You may stop using the incentive spirometer when instructed by your caregiver.  RISKS AND COMPLICATIONS  Take your time so you do not get  dizzy or light-headed.  If you are in pain, you may need to take or ask for pain medication before doing incentive spirometry. It is harder to take a deep breath if you are having pain. AFTER USE  Rest and breathe slowly and easily.  It can be helpful  to keep track of a log of your progress. Your caregiver can provide you with a simple table to help with this. If you are using the spirometer at home, follow these instructions: [place] IF:   You are having difficultly using the spirometer.  You have trouble using the spirometer as often as instructed.  Your pain medication is not giving enough relief while using the spirometer.  You develop fever of 100.5 F (38.1 C) or higher. SEEK IMMEDIATE MEDICAL CARE IF:   You cough up bloody sputum that had not been present before.  You develop fever of 102 F (38.9 C) or greater.  You develop worsening pain at or near the incision site. MAKE SURE YOU:   Understand these instructions.  Will watch your condition.  Will get help right away if you are not doing well or get worse. Document Released: 09/26/2006 Document Revised: 08/08/2011 Document Reviewed: 11/27/2006 ExitCare Patient Information 2014 Exit[employer], [place].   ________________________________________________________________________  WHAT IS A BLOOD TRANSFUSION? Blood Transfusion Information  A transfusion is the replacement of blood or some of its parts. Blood is made up of multiple cells which provide different functions.  Red blood cells carry oxygen and are used for blood loss replacement.  White blood cells fight against infection.  Platelets control bleeding.  Plasma helps clot blood.  Other blood products are available for specialized needs, such as hemophilia or other clotting disorders. BEFORE THE TRANSFUSION  Who gives blood for transfusions?   Healthy volunteers who are fully evaluated to make sure their blood is safe. This is blood bank blood. Transfusion therapy is the safest it has ever been in the practice of medicine. Before blood is taken from a donor, a complete history is taken to make sure that person has no history of diseases nor engages in risky social behavior (examples are intravenous  drug use or sexual activity with multiple partners). The donor's travel history is screened to minimize risk of transmitting infections, such as malaria. The donated blood is tested for signs of infectious diseases, such as HIV and hepatitis. The blood is then tested to be sure it is compatible with you in order to minimize the chance of a transfusion reaction. If you or a relative donates blood, this is often done in anticipation of surgery and is not appropriate for emergency situations. It takes many days to process the donated blood. RISKS AND COMPLICATIONS Although transfusion therapy is very safe and saves many lives, the main dangers of transfusion include:   Getting an infectious disease.  Developing a transfusion reaction. This is an allergic reaction to something in the blood you were given. Every precaution is taken to prevent this. The decision to have a blood transfusion has been considered carefully by your caregiver before blood is given. Blood is not given unless the benefits outweigh the risks. AFTER THE TRANSFUSION  Right after receiving a blood transfusion, you will usually feel much better and more energetic. This is especially true if your red blood cells have gotten low (anemic). The transfusion raises the level of the red blood cells which carry oxygen, and this usually causes an energy increase.  The nurse administering the transfusion will  monitor you carefully for complications. HOME CARE INSTRUCTIONS  No special instructions are needed after a transfusion. You may find your energy is better. Speak with your caregiver about any limitations on activity for underlying diseases you may have. SEEK MEDICAL CARE IF:   Your condition is not improving after your transfusion.  You develop redness or irritation at the intravenous (IV) site. SEEK IMMEDIATE MEDICAL CARE IF:  Any of the following symptoms occur over the next 12 hours:  Shaking chills.  You have a temperature by  mouth above 102 F (38.9 C), not controlled by medicine.  Chest, back, or muscle pain.  People around you feel you are not acting correctly or are confused.  Shortness of breath or difficulty breathing.  Dizziness and fainting.  You get a rash or develop hives.  You have a decrease in urine output.  Your urine turns a dark color or changes to pink, red, or brown. Any of the following symptoms occur over the next 10 days:  You have a temperature by mouth above 102 F (38.9 C), not controlled by medicine.  Shortness of breath.  Weakness after normal activity.  The white part of the eye turns yellow (jaundice).  You have a decrease in the amount of urine or are urinating less often.  Your urine turns a dark color or changes to pink, red, or brown. Document Released: 05/13/2000 Document Revised: 08/08/2011 Document Reviewed: 12/31/2007 [facility] Patient Information 2014 [place], [place].  _______________________________________________________________________

## 2015-10-30 ENCOUNTER — Ambulatory Visit: Payer: Self-pay | Admitting: Orthopedic Surgery

## 2015-10-30 ENCOUNTER — Encounter (HOSPITAL_COMMUNITY)
Admission: RE | Admit: 2015-10-30 | Discharge: 2015-10-30 | Disposition: A | Discharge status: Home / Self Care | Payer: Medicare Other | Source: Ambulatory Visit | Attending: Orthopedic Surgery | Admitting: Orthopedic Surgery

## 2015-10-30 ENCOUNTER — Encounter (HOSPITAL_COMMUNITY): Payer: Self-pay

## 2015-10-30 DIAGNOSIS — Z01818 Encounter for other preprocedural examination: Secondary | ICD-10-CM | POA: Diagnosis present

## 2015-10-30 DIAGNOSIS — Z01812 Encounter for preprocedural laboratory examination: Secondary | ICD-10-CM | POA: Diagnosis not present

## 2015-10-30 DIAGNOSIS — I10 Essential (primary) hypertension: Secondary | ICD-10-CM | POA: Insufficient documentation

## 2015-10-30 DIAGNOSIS — R001 Bradycardia, unspecified: Secondary | ICD-10-CM | POA: Insufficient documentation

## 2015-10-30 DIAGNOSIS — Z0183 Encounter for blood typing: Secondary | ICD-10-CM | POA: Insufficient documentation

## 2015-10-30 DIAGNOSIS — M1711 Unilateral primary osteoarthritis, right knee: Secondary | ICD-10-CM | POA: Insufficient documentation

## 2015-10-30 LAB — BASIC METABOLIC PANEL
Anion gap: 9 (ref 5–15)
BUN: 16 mg/dL (ref 6–20)
CALCIUM: 9.2 mg/dL (ref 8.9–10.3)
CO2: 25 mmol/L (ref 22–32)
CREATININE: 0.84 mg/dL (ref 0.61–1.24)
Chloride: 103 mmol/L (ref 101–111)
GFR calc non Af Amer: >60 mL/min (ref >60)
Glucose, Bld: 140 mg/dL — ABNORMAL HIGH (ref 65–99)
Potassium: 3.7 mmol/L (ref 3.5–5.1)
SODIUM: 137 mmol/L (ref 135–145)

## 2015-10-30 LAB — CBC
HCT: 44 % (ref 39.0–52.0)
Hemoglobin: 15.6 g/dL (ref 13.0–17.0)
MCH: 31.6 pg (ref 26.0–34.0)
MCHC: 35.5 g/dL (ref 30.0–36.0)
MCV: 89.1 fL (ref 78.0–100.0)
PLATELETS: 227 10*3/uL (ref 150–400)
RBC: 4.94 MIL/uL (ref 4.22–5.81)
RDW: 13.2 % (ref 11.5–15.5)
WBC: 8.6 10*3/uL (ref 4.0–10.5)

## 2015-10-30 LAB — SURGICAL PCR SCREEN
MRSA, PCR: NEGATIVE
Staphylococcus aureus: POSITIVE — AB

## 2015-10-30 NOTE — Progress Notes (Signed)
Preoperative surgical orders have been place into the Epic hospital system for Jeffrey Andrews on 10/30/2015, 9:20 AM  by Mickel Crow for surgery on 11-09-2015.  Preop Total Knee orders including Experal, IV Tylenol, and IV Decadron as long as there are no contraindications to the above medications. Arlee Muslim, PA-C

## 2015-10-30 NOTE — Pre-Procedure Instructions (Signed)
Pt uses CPAP at home. Pressure settings at 11. Pt instructed to bring mask and tubing morning of surgery.

## 2015-10-30 NOTE — Pre-Procedure Instructions (Addendum)
Medical Clearance, Dr. Dierdre Searles, on chart  Pt PCR from today's pre-op visit returned positive for Staph. Left message for pt, instructed to return call.  Routed results to Dr. Wynelle Link.

## 2015-11-08 ENCOUNTER — Ambulatory Visit: Payer: Self-pay | Admitting: Orthopedic Surgery

## 2015-11-08 NOTE — Anesthesia Preprocedure Evaluation (Addendum)
Anesthesia Evaluation  Patient identified by MRN, date of birth, ID band Patient awake    Reviewed: Allergy & Precautions, H&P , NPO status , Patient's Chart, lab work & pertinent test results  Airway Mallampati: II  TM Distance: >3 FB Neck ROM: Full    Dental no notable dental hx. (+) Teeth Intact, Dental Advisory Given   Pulmonary sleep apnea and Continuous Positive Airway Pressure Ventilation ,    Pulmonary exam normal breath sounds clear to auscultation       Cardiovascular hypertension, Pt. on medications  Rhythm:Regular Rate:Normal     Neuro/Psych negative neurological ROS  negative psych ROS   GI/Hepatic negative GI ROS, Neg liver ROS,   Endo/Other  Morbid obesity  Renal/GU negative Renal ROS  negative genitourinary   Musculoskeletal  (+) Arthritis , Osteoarthritis,    Abdominal   Peds  Hematology negative hematology ROS (+)   Anesthesia Other Findings   Reproductive/Obstetrics negative OB ROS                            Anesthesia Physical Anesthesia Plan  ASA: III  Anesthesia Plan: Spinal and MAC   Post-op Pain Management:    Induction: Intravenous  Airway Management Planned: Simple Face Mask  Additional Equipment:   Intra-op Plan:   Post-operative Plan:   Informed Consent: I have reviewed the patients History and Physical, chart, labs and discussed the procedure including the risks, benefits and alternatives for the proposed anesthesia with the patient or authorized representative who has indicated his/her understanding and acceptance.   Dental advisory given  Plan Discussed with: CRNA  Anesthesia Plan Comments:        Anesthesia Quick Evaluation

## 2015-11-08 NOTE — H&P (Signed)
Jeffrey Andrews DOB: [birth year] Married / Language: English / Race: White Male Date of Admission:  11/09/2015 CC:  Right Knee Pain History of Present Illness The patient is a 70 year old male who comes in today for a preoperative History and Physical. The patient is scheduled for a right total knee arthroplasty to be performed by Dr. Dione Andrews. Aluisio, MD at [facility] on 11-09-2015. The patient is a 70 year old male who presented for follow up of their knee. The patient has been followed for their bilateral osteoarthritis. They have had  cortisone injections in the past (he got no relief from any of the injections). Symptoms reported include: pain, pain after sitting, pain at night, swelling, aching and stiffness. The patient feels that they are doing poorly (left knee is very painful) and report their pain level to be moderate. The following medication has been used for pain control: none. The patient has not gotten any relief of their symptoms with Cortisone injections. The patient indicates that they are ready to proceed wtih total knee.  He has already has the left knee replaced and now ready for the right knee. Unfortunately, the cortisone did not help much. He was trying to hold off as long as possible, but he feels like his left knee has gotten to the point where it has essentially taken over his life now. It is limiting what he can and cannot do. He has pain with all activities as well as pain at rest. He is now ready to proceed with surgery. Risks and benefits of the surgery have been discussed with the patient and they elect to proceed with surgery.  There are on active contraindications to upcoming procedure such as ongoing infection or progressive neurological disease.   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 Osteoarthritis  Mother. mother and father Cancer  mother Diabetes Mellitus  sister Father  Deceased, Cerebrovascular Accident. age 10 Mother  Deceased, Congestive Heart Failure. age 31  Social History Tobacco use  Never smoker. 12/10/2013 never smoker; uses 2 or more can(s) smokeless per week Illicit drug use  no Number of flights of stairs before winded  4-5 2-3 Tobacco / smoke exposure  12/10/2013: no no Pain Contract  no Previously in rehab  no Drug/Alcohol Rehab (Currently)  no Alcohol use  never consumed alcohol Exercise  Exercises never Living situation  live with spouse Children  5 or more Current work status  retired No history of drug/alcohol rehab  Not under pain contract  Marital status  married Never consumed alcohol  12/10/2013: Never consumed alcohol Advance Directives  Living Will [name] for home following the Right Total Knee Replacement on 11/09/2015.  Past Surgical History  Cataract Surgery  bilateral Total Knee Replacement - Left [06/08/2015]: Spinal Surgery  3 times Other Surgery  reattachment quad tendon left knee Vasectomy  Tonsillectomy  Left Wrist Surgery    Review of Systems  General Not Present- Chills, Fatigue, Fever, Memory Loss, Night Sweats, Weight Gain and Weight Loss. Skin Not Present- Eczema, Hives, Itching, Lesions and Rash. HEENT Not Present- Dentures, Double Vision, Headache, Hearing Loss, Tinnitus and Visual Loss. Respiratory Not  Present- Allergies, Chronic Cough, Coughing up blood, Shortness of breath at rest and Shortness of breath with exertion. Cardiovascular Not Present- Chest Pain, Difficulty Breathing Lying Down, Murmur, Palpitations, Racing/skipping heartbeats and Swelling. Gastrointestinal Not Present- Abdominal Pain, Bloody Stool, Constipation, Diarrhea, Difficulty Swallowing, Heartburn, Jaundice, Loss of appetitie, Nausea and Vomiting. Male Genitourinary Not Present- Blood in Urine, Discharge, Flank Pain, Incontinence, Painful Urination, Urgency, Urinary frequency, Urinary Retention, Urinating at Night and Weak urinary stream. Musculoskeletal Present- Back Pain and Joint Pain. Not Present- Joint Swelling, Morning Stiffness, Muscle Pain, Muscle Weakness and Spasms. Neurological Not Present- Blackout spells, Difficulty with balance, Dizziness, Paralysis, Tremor and Weakness. Psychiatric Not Present- Insomnia.  Vitals Weight: 230 lb Height: 68in Weight was reported by patient. Height was reported by patient. Body Surface Area: 2.17 m Body Mass Index: 34.97 kg/m  Pulse: 76 (Regular)  BP: 138/80 (Sitting, Right Arm, Standard)  Physical Exam General Mental Status -Alert, cooperative and good historian. General Appearance-pleasant, Not in acute distress. Orientation-Oriented X3. Build & Nutrition-Well nourished and Well developed.  Head and Neck Head-normocephalic, atraumatic . Neck Global Assessment - supple, no bruit auscultated on the right, no bruit auscultated on the left.  Eye Pupil - Bilateral-Regular and Round. Motion - Bilateral-EOMI.  ENMT Note: bilateral hearing aids   Chest and Lung Exam Auscultation Breath sounds - clear at anterior chest wall and clear at posterior chest wall. Adventitious sounds - No Adventitious sounds.  Cardiovascular Auscultation Rhythm - Regular rate and rhythm. Heart Sounds - S1 WNL and S2 WNL. Murmurs & Other Heart Sounds: Murmur 1  - Location - Aortic Area and Pulmonic Area. Timing - Early systolic. Grade - II/VI.  Abdomen Palpation/Percussion Tenderness - Abdomen is non-tender to palpation. Rigidity (guarding) - Abdomen is soft. Auscultation Auscultation of the abdomen reveals - Bowel sounds normal.  Male Genitourinary Note: Not done, not pertinent to present illness   Musculoskeletal Note: Right knee range 0 to 125, moderate crepitus and range of motion. Some tenderness medial greater than lateral with no instability. His left knee shows no swelling. His range is 5 to 122. There is no instability.  RADIOGRAPHS AP and lateral of the right knee shos that he advanced arthritis of the right knee.  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 Dierdre Searles - [facility] - Patient has been seen preoperatively and felt to be stable for surgery. Dr. Shirline Frees  IV TXA  Anesthesia Issues: None  Arlee Muslim, PA-C

## 2015-11-09 ENCOUNTER — Encounter (HOSPITAL_COMMUNITY)
Admission: RE | Disposition: A | Discharge status: Home Health | Payer: Self-pay | Source: Ambulatory Visit | Attending: Orthopedic Surgery

## 2015-11-09 ENCOUNTER — Inpatient Hospital Stay (HOSPITAL_COMMUNITY): Payer: Medicare Other | Admitting: Anesthesiology

## 2015-11-09 ENCOUNTER — Encounter (HOSPITAL_COMMUNITY): Payer: Self-pay | Admitting: *Deleted

## 2015-11-09 ENCOUNTER — Inpatient Hospital Stay (HOSPITAL_COMMUNITY)
Admission: RE | Admit: 2015-11-09 | Discharge: 2015-11-10 | DRG: 470 | Disposition: A | Discharge status: Home Health | Payer: Medicare Other | Source: Ambulatory Visit | Attending: Orthopedic Surgery | Admitting: Orthopedic Surgery

## 2015-11-09 DIAGNOSIS — Z6837 Body mass index (BMI) 37.0-37.9, adult: Secondary | ICD-10-CM

## 2015-11-09 DIAGNOSIS — G4730 Sleep apnea, unspecified: Secondary | ICD-10-CM | POA: Diagnosis present

## 2015-11-09 DIAGNOSIS — R010 Benign and innocent cardiac murmurs: Secondary | ICD-10-CM | POA: Diagnosis present

## 2015-11-09 DIAGNOSIS — M1710 Unilateral primary osteoarthritis, unspecified knee: Secondary | ICD-10-CM | POA: Diagnosis present

## 2015-11-09 DIAGNOSIS — Z833 Family history of diabetes mellitus: Secondary | ICD-10-CM | POA: Diagnosis not present

## 2015-11-09 DIAGNOSIS — M170 Bilateral primary osteoarthritis of knee: Principal | ICD-10-CM | POA: Diagnosis present

## 2015-11-09 DIAGNOSIS — M179 Osteoarthritis of knee, unspecified: Secondary | ICD-10-CM | POA: Diagnosis present

## 2015-11-09 DIAGNOSIS — H9190 Unspecified hearing loss, unspecified ear: Secondary | ICD-10-CM | POA: Diagnosis present

## 2015-11-09 DIAGNOSIS — Z85828 Personal history of other malignant neoplasm of skin: Secondary | ICD-10-CM | POA: Diagnosis not present

## 2015-11-09 DIAGNOSIS — Z8249 Family history of ischemic heart disease and other diseases of the circulatory system: Secondary | ICD-10-CM | POA: Diagnosis not present

## 2015-11-09 DIAGNOSIS — E7800 Pure hypercholesterolemia, unspecified: Secondary | ICD-10-CM | POA: Diagnosis present

## 2015-11-09 DIAGNOSIS — Z823 Family history of stroke: Secondary | ICD-10-CM

## 2015-11-09 DIAGNOSIS — I10 Essential (primary) hypertension: Secondary | ICD-10-CM | POA: Diagnosis present

## 2015-11-09 DIAGNOSIS — M25561 Pain in right knee: Secondary | ICD-10-CM | POA: Diagnosis present

## 2015-11-09 DIAGNOSIS — Z87442 Personal history of urinary calculi: Secondary | ICD-10-CM

## 2015-11-09 DIAGNOSIS — Z809 Family history of malignant neoplasm, unspecified: Secondary | ICD-10-CM

## 2015-11-09 HISTORY — PX: TOTAL KNEE ARTHROPLASTY: SHX125

## 2015-11-09 LAB — TYPE AND SCREEN
ABO/RH(D): A POS
Antibody Screen: NEGATIVE

## 2015-11-09 SURGERY — ARTHROPLASTY, KNEE, TOTAL
Anesthesia: Monitor Anesthesia Care | Site: Knee | Laterality: Right

## 2015-11-09 MED ORDER — HYDROMORPHONE HCL 1 MG/ML IJ SOLN
INTRAMUSCULAR | Status: AC
  Filled 2015-11-09: qty 1

## 2015-11-09 MED ORDER — ACETAMINOPHEN 10 MG/ML IV SOLN
1000.0000 mg | Freq: Once | INTRAVENOUS | Status: AC
  Administered 2015-11-09: 1000 mg via INTRAVENOUS
  Filled 2015-11-09: qty 100

## 2015-11-09 MED ORDER — MIDAZOLAM HCL 2 MG/2ML IJ SOLN
INTRAMUSCULAR | Status: DC | PRN
  Administered 2015-11-09: 2 mg via INTRAVENOUS

## 2015-11-09 MED ORDER — TRAMADOL HCL 50 MG PO TABS
50.0000 mg | ORAL_TABLET | Freq: Four times a day (QID) | ORAL | Status: DC | PRN
  Administered 2015-11-09 – 2015-11-10 (×3): 100 mg via ORAL
  Filled 2015-11-09 (×3): qty 2

## 2015-11-09 MED ORDER — HYDROMORPHONE HCL 1 MG/ML IJ SOLN
0.2500 mg | INTRAMUSCULAR | Status: DC | PRN
  Administered 2015-11-09 (×4): 0.5 mg via INTRAVENOUS

## 2015-11-09 MED ORDER — ONDANSETRON HCL 4 MG/2ML IJ SOLN
INTRAMUSCULAR | Status: DC | PRN
  Administered 2015-11-09: 4 mg via INTRAVENOUS

## 2015-11-09 MED ORDER — MORPHINE SULFATE (PF) 2 MG/ML IV SOLN: 1.0000 mg | INTRAVENOUS | Status: DC | PRN

## 2015-11-09 MED ORDER — MIDAZOLAM HCL 2 MG/2ML IJ SOLN
INTRAMUSCULAR | Status: AC
  Filled 2015-11-09: qty 2

## 2015-11-09 MED ORDER — METHOCARBAMOL 500 MG PO TABS
500.0000 mg | ORAL_TABLET | Freq: Four times a day (QID) | ORAL | Status: DC | PRN
  Administered 2015-11-10: 500 mg via ORAL
  Filled 2015-11-09: qty 1

## 2015-11-09 MED ORDER — ACETAMINOPHEN 325 MG PO TABS: 650.0000 mg | ORAL_TABLET | Freq: Four times a day (QID) | ORAL | Status: DC | PRN

## 2015-11-09 MED ORDER — RIVAROXABAN 10 MG PO TABS
10.0000 mg | ORAL_TABLET | Freq: Every day | ORAL | Status: DC
  Administered 2015-11-10: 10 mg via ORAL
  Filled 2015-11-09: qty 1

## 2015-11-09 MED ORDER — METOCLOPRAMIDE HCL 5 MG PO TABS: 5.0000 mg | ORAL_TABLET | Freq: Three times a day (TID) | ORAL | Status: DC | PRN

## 2015-11-09 MED ORDER — PHENYLEPHRINE 40 MCG/ML (10ML) SYRINGE FOR IV PUSH (FOR BLOOD PRESSURE SUPPORT)
PREFILLED_SYRINGE | INTRAVENOUS | Status: AC
  Filled 2015-11-09: qty 10

## 2015-11-09 MED ORDER — PROPOFOL 10 MG/ML IV BOLUS
INTRAVENOUS | Status: AC
  Filled 2015-11-09: qty 120

## 2015-11-09 MED ORDER — METOCLOPRAMIDE HCL 5 MG/ML IJ SOLN
5.0000 mg | Freq: Three times a day (TID) | INTRAMUSCULAR | Status: DC | PRN
Start: 2015-11-09 — End: 2015-11-10

## 2015-11-09 MED ORDER — DEXAMETHASONE SODIUM PHOSPHATE 10 MG/ML IJ SOLN
INTRAMUSCULAR | Status: AC
  Filled 2015-11-09: qty 1

## 2015-11-09 MED ORDER — PROPOFOL 10 MG/ML IV BOLUS
INTRAVENOUS | Status: DC | PRN
  Administered 2015-11-09: 150 mg via INTRAVENOUS

## 2015-11-09 MED ORDER — LACTATED RINGERS IV SOLN
INTRAVENOUS | Status: DC | PRN
  Administered 2015-11-09 (×2): via INTRAVENOUS

## 2015-11-09 MED ORDER — MENTHOL 3 MG MT LOZG: 1.0000 | LOZENGE | OROMUCOSAL | Status: DC | PRN

## 2015-11-09 MED ORDER — TRANEXAMIC ACID 1000 MG/10ML IV SOLN
1000.0000 mg | Freq: Once | INTRAVENOUS | Status: AC
  Administered 2015-11-09: 1000 mg via INTRAVENOUS
  Filled 2015-11-09: qty 10

## 2015-11-09 MED ORDER — BUPIVACAINE LIPOSOME 1.3 % IJ SUSP
INTRAMUSCULAR | Status: DC | PRN
  Administered 2015-11-09: 20 mL

## 2015-11-09 MED ORDER — PHENOL 1.4 % MT LIQD: 1.0000 | OROMUCOSAL | Status: DC | PRN

## 2015-11-09 MED ORDER — OXYCODONE HCL 5 MG PO TABS
5.0000 mg | ORAL_TABLET | ORAL | Status: DC | PRN
  Administered 2015-11-10: 5 mg via ORAL
  Filled 2015-11-09: qty 2

## 2015-11-09 MED ORDER — DEXAMETHASONE SODIUM PHOSPHATE 10 MG/ML IJ SOLN
10.0000 mg | Freq: Once | INTRAMUSCULAR | Status: AC
  Administered 2015-11-10: 10 mg via INTRAVENOUS
  Filled 2015-11-09: qty 1

## 2015-11-09 MED ORDER — BUPIVACAINE HCL (PF) 0.25 % IJ SOLN
INTRAMUSCULAR | Status: AC
  Filled 2015-11-09: qty 30

## 2015-11-09 MED ORDER — SODIUM CHLORIDE 0.9 % IJ SOLN
INTRAMUSCULAR | Status: DC | PRN
  Administered 2015-11-09: 30 mL

## 2015-11-09 MED ORDER — FLEET ENEMA 7-19 GM/118ML RE ENEM: 1.0000 | ENEMA | Freq: Once | RECTAL | Status: DC | PRN

## 2015-11-09 MED ORDER — SODIUM CHLORIDE 0.9 % IV SOLN
INTRAVENOUS | Status: DC
  Administered 2015-11-09 (×2): via INTRAVENOUS

## 2015-11-09 MED ORDER — BUPIVACAINE HCL 0.25 % IJ SOLN: INTRAMUSCULAR | Status: DC | PRN

## 2015-11-09 MED ORDER — FENTANYL CITRATE (PF) 100 MCG/2ML IJ SOLN
INTRAMUSCULAR | Status: AC
  Filled 2015-11-09: qty 2

## 2015-11-09 MED ORDER — ACETAMINOPHEN 500 MG PO TABS
1000.0000 mg | ORAL_TABLET | Freq: Four times a day (QID) | ORAL | Status: AC
  Administered 2015-11-09 (×2): 1000 mg via ORAL
  Filled 2015-11-09 (×2): qty 2

## 2015-11-09 MED ORDER — BUPIVACAINE LIPOSOME 1.3 % IJ SUSP
20.0000 mL | Freq: Once | INTRAMUSCULAR | Status: DC
  Filled 2015-11-09: qty 20

## 2015-11-09 MED ORDER — ONDANSETRON HCL 4 MG/2ML IJ SOLN
INTRAMUSCULAR | Status: AC
  Filled 2015-11-09: qty 2

## 2015-11-09 MED ORDER — CEFAZOLIN SODIUM-DEXTROSE 2-4 GM/100ML-% IV SOLN
2.0000 g | Freq: Four times a day (QID) | INTRAVENOUS | Status: AC
  Administered 2015-11-09 (×2): 2 g via INTRAVENOUS
  Filled 2015-11-09 (×2): qty 100

## 2015-11-09 MED ORDER — PROPOFOL 500 MG/50ML IV EMUL
INTRAVENOUS | Status: DC | PRN
  Administered 2015-11-09: 75 ug/kg/min via INTRAVENOUS
  Administered 2015-11-09: 100 ug/kg/min via INTRAVENOUS

## 2015-11-09 MED ORDER — SIMVASTATIN 20 MG PO TABS
20.0000 mg | ORAL_TABLET | Freq: Every day | ORAL | Status: DC
  Administered 2015-11-10: 20 mg via ORAL
  Filled 2015-11-09: qty 1

## 2015-11-09 MED ORDER — ONDANSETRON HCL 4 MG/2ML IJ SOLN: 4.0000 mg | Freq: Four times a day (QID) | INTRAMUSCULAR | Status: DC | PRN

## 2015-11-09 MED ORDER — SODIUM CHLORIDE 0.9 % IR SOLN
Status: DC | PRN
  Administered 2015-11-09: 1000 mL

## 2015-11-09 MED ORDER — CEFAZOLIN SODIUM-DEXTROSE 2-4 GM/100ML-% IV SOLN
2.0000 g | INTRAVENOUS | Status: AC
  Administered 2015-11-09: 2 g via INTRAVENOUS
  Filled 2015-11-09: qty 100

## 2015-11-09 MED ORDER — POLYETHYLENE GLYCOL 3350 17 G PO PACK: 17.0000 g | PACK | Freq: Every day | ORAL | Status: DC | PRN

## 2015-11-09 MED ORDER — DEXAMETHASONE SODIUM PHOSPHATE 10 MG/ML IJ SOLN
10.0000 mg | Freq: Once | INTRAMUSCULAR | Status: AC
  Administered 2015-11-09: 10 mg via INTRAVENOUS

## 2015-11-09 MED ORDER — HYDROCHLOROTHIAZIDE 25 MG PO TABS
12.5000 mg | ORAL_TABLET | Freq: Every day | ORAL | Status: DC
  Administered 2015-11-10: 12.5 mg via ORAL
  Filled 2015-11-09: qty 1

## 2015-11-09 MED ORDER — FENTANYL CITRATE (PF) 100 MCG/2ML IJ SOLN
INTRAMUSCULAR | Status: DC | PRN
  Administered 2015-11-09 (×2): 50 ug via INTRAVENOUS

## 2015-11-09 MED ORDER — LIDOCAINE HCL (CARDIAC) 20 MG/ML IV SOLN
INTRAVENOUS | Status: AC
  Filled 2015-11-09: qty 5

## 2015-11-09 MED ORDER — SODIUM CHLORIDE 0.9 % IJ SOLN
INTRAMUSCULAR | Status: AC
  Filled 2015-11-09: qty 50

## 2015-11-09 MED ORDER — DIPHENHYDRAMINE HCL 12.5 MG/5ML PO ELIX
12.5000 mg | ORAL_SOLUTION | ORAL | Status: DC | PRN
Start: 2015-11-09 — End: 2015-11-10

## 2015-11-09 MED ORDER — METHOCARBAMOL 1000 MG/10ML IJ SOLN
500.0000 mg | Freq: Four times a day (QID) | INTRAMUSCULAR | Status: DC | PRN
  Administered 2015-11-09: 500 mg via INTRAVENOUS
  Filled 2015-11-09: qty 5
  Filled 2015-11-09: qty 550

## 2015-11-09 MED ORDER — BUPIVACAINE IN DEXTROSE 0.75-8.25 % IT SOLN
INTRATHECAL | Status: DC | PRN
  Administered 2015-11-09: 15 mg via INTRATHECAL

## 2015-11-09 MED ORDER — CEFAZOLIN SODIUM-DEXTROSE 2-4 GM/100ML-% IV SOLN
INTRAVENOUS | Status: AC
  Filled 2015-11-09: qty 100

## 2015-11-09 MED ORDER — ONDANSETRON HCL 4 MG PO TABS: 4.0000 mg | ORAL_TABLET | Freq: Four times a day (QID) | ORAL | Status: DC | PRN

## 2015-11-09 MED ORDER — ROCURONIUM BROMIDE 100 MG/10ML IV SOLN
INTRAVENOUS | Status: AC
  Filled 2015-11-09: qty 1

## 2015-11-09 MED ORDER — BISACODYL 10 MG RE SUPP
10.0000 mg | Freq: Every day | RECTAL | Status: DC | PRN
Start: 2015-11-09 — End: 2015-11-10

## 2015-11-09 MED ORDER — TRANEXAMIC ACID 1000 MG/10ML IV SOLN
1000.0000 mg | INTRAVENOUS | Status: AC
  Administered 2015-11-09: 1000 mg via INTRAVENOUS
  Filled 2015-11-09: qty 10

## 2015-11-09 MED ORDER — DOCUSATE SODIUM 100 MG PO CAPS
100.0000 mg | ORAL_CAPSULE | Freq: Two times a day (BID) | ORAL | Status: DC
  Administered 2015-11-09 – 2015-11-10 (×2): 100 mg via ORAL
  Filled 2015-11-09 (×2): qty 1

## 2015-11-09 MED ORDER — ACETAMINOPHEN 650 MG RE SUPP: 650.0000 mg | Freq: Four times a day (QID) | RECTAL | Status: DC | PRN

## 2015-11-09 MED ORDER — 0.9 % SODIUM CHLORIDE (POUR BTL) OPTIME
TOPICAL | Status: DC | PRN
  Administered 2015-11-09: 1000 mL

## 2015-11-09 MED ORDER — ACETAMINOPHEN 10 MG/ML IV SOLN
INTRAVENOUS | Status: AC
  Filled 2015-11-09: qty 100

## 2015-11-09 SURGICAL SUPPLY — 50 items
BAG DECANTER FOR FLEXI CONT (MISCELLANEOUS) ×3 IMPLANT
BAG ZIPLOCK 12X15 (MISCELLANEOUS) ×3 IMPLANT
BANDAGE ACE 6X5 VEL STRL LF (GAUZE/BANDAGES/DRESSINGS) ×3 IMPLANT
BLADE SAG 18X100X1.27 (BLADE) ×3 IMPLANT
BLADE SAW SGTL 11.0X1.19X90.0M (BLADE) ×3 IMPLANT
BOWL SMART MIX CTS (DISPOSABLE) ×3 IMPLANT
CAPT KNEE TOTAL 3 ATTUNE ×3 IMPLANT
CEMENT HV SMART SET (Cement) ×6 IMPLANT
CLOSURE WOUND 1/2 X4 (GAUZE/BANDAGES/DRESSINGS) ×1
CLOTH BEACON ORANGE TIMEOUT ST (SAFETY) ×3 IMPLANT
CUFF TOURN SGL QUICK 34 (TOURNIQUET CUFF) ×2
CUFF TRNQT CYL 34X4X40X1 (TOURNIQUET CUFF) ×1 IMPLANT
DECANTER SPIKE VIAL GLASS SM (MISCELLANEOUS) ×3 IMPLANT
DRAPE U-SHAPE 47X51 STRL (DRAPES) ×3 IMPLANT
DRSG ADAPTIC 3X8 NADH LF (GAUZE/BANDAGES/DRESSINGS) ×3 IMPLANT
DRSG PAD ABDOMINAL 8X10 ST (GAUZE/BANDAGES/DRESSINGS) ×3 IMPLANT
DURAPREP 26ML APPLICATOR (WOUND CARE) ×3 IMPLANT
ELECT REM PT RETURN 9FT ADLT (ELECTROSURGICAL) ×3
ELECTRODE REM PT RTRN 9FT ADLT (ELECTROSURGICAL) ×1 IMPLANT
EVACUATOR 1/8 PVC DRAIN (DRAIN) ×3 IMPLANT
GAUZE SPONGE 4X4 12PLY STRL (GAUZE/BANDAGES/DRESSINGS) ×3 IMPLANT
GLOVE BIO SURGEON STRL SZ7.5 (GLOVE) ×12 IMPLANT
GLOVE BIO SURGEON STRL SZ8 (GLOVE) ×3 IMPLANT
GLOVE BIOGEL PI IND STRL 6.5 (GLOVE) IMPLANT
GLOVE BIOGEL PI IND STRL 8 (GLOVE) ×2 IMPLANT
GLOVE BIOGEL PI INDICATOR 6.5 (GLOVE)
GLOVE BIOGEL PI INDICATOR 8 (GLOVE) ×4
GLOVE SURG SS PI 6.5 STRL IVOR (GLOVE) ×12 IMPLANT
GOWN STRL REUS W/TWL LRG LVL3 (GOWN DISPOSABLE) ×3 IMPLANT
GOWN STRL REUS W/TWL XL LVL3 (GOWN DISPOSABLE) IMPLANT
HANDPIECE INTERPULSE COAX TIP (DISPOSABLE) ×2
IMMOBILIZER KNEE 20 (SOFTGOODS) ×3
IMMOBILIZER KNEE 20 THIGH 36 (SOFTGOODS) ×1 IMPLANT
MANIFOLD NEPTUNE II (INSTRUMENTS) ×3 IMPLANT
NS IRRIG 1000ML POUR BTL (IV SOLUTION) ×3 IMPLANT
PACK TOTAL KNEE CUSTOM (KITS) ×3 IMPLANT
PADDING CAST COTTON 6X4 STRL (CAST SUPPLIES) ×3 IMPLANT
POSITIONER SURGICAL ARM (MISCELLANEOUS) ×3 IMPLANT
SET HNDPC FAN SPRY TIP SCT (DISPOSABLE) ×1 IMPLANT
STRIP CLOSURE SKIN 1/2X4 (GAUZE/BANDAGES/DRESSINGS) ×2 IMPLANT
SUT MNCRL AB 4-0 PS2 18 (SUTURE) ×3 IMPLANT
SUT VIC AB 2-0 CT1 27 (SUTURE) ×6
SUT VIC AB 2-0 CT1 TAPERPNT 27 (SUTURE) ×3 IMPLANT
SUT VLOC 180 0 24IN GS25 (SUTURE) ×3 IMPLANT
SYR 50ML LL SCALE MARK (SYRINGE) ×3 IMPLANT
TRAY FOLEY W/METER SILVER 14FR (SET/KITS/TRAYS/PACK) IMPLANT
TRAY FOLEY W/METER SILVER 16FR (SET/KITS/TRAYS/PACK) ×3 IMPLANT
WATER STERILE IRR 1500ML POUR (IV SOLUTION) ×3 IMPLANT
WRAP KNEE MAXI GEL POST OP (GAUZE/BANDAGES/DRESSINGS) ×3 IMPLANT
YANKAUER SUCT BULB TIP 10FT TU (MISCELLANEOUS) ×3 IMPLANT

## 2015-11-09 NOTE — Anesthesia Procedure Notes (Addendum)
Spinal Patient location during procedure: OR Start time: 11/09/2015 8:20 AM End time: 11/09/2015 8:26 AM Staffing Anesthesiologist: Roderic Palau Performed by: anesthesiologist  Preanesthetic Checklist Completed: patient identified, surgical consent, pre-op evaluation, timeout performed, IV checked, risks and benefits discussed and monitors and equipment checked Spinal Block Patient position: sitting Prep: Betadine Patient monitoring: cardiac monitor, continuous pulse ox and blood pressure Approach: midline Location: L4-5 Injection technique: single-shot Needle Needle type: Spinocan  Needle gauge: 22 G Needle length: 9 cm Assessment Sensory level: T8 Additional Notes Functioning IV was confirmed and monitors were applied. Sterile prep and drape, including hand hygiene and sterile gloves were used. The patient was positioned and the spine was prepped. The skin was anesthetized with lidocaine.  Free flow of clear CSF was obtained prior to injecting local anesthetic into the CSF.  The spinal needle aspirated freely following injection.  The needle was carefully withdrawn.  The patient tolerated the procedure well.   Procedure Name: LMA Insertion Date/Time: 11/09/2015 8:56 AM Performed by: Pilar Grammes Pre-anesthesia Checklist: Patient identified, Emergency Drugs available, Suction available, Patient being monitored and Timeout performed Patient Re-evaluated:Patient Re-evaluated prior to inductionOxygen Delivery Method: Circle system utilized Preoxygenation: Pre-oxygenation with 100% oxygen Intubation Type: IV induction Ventilation: Mask ventilation without difficulty LMA: LMA inserted LMA Size: 4.5 Placement Confirmation: positive ETCO2 and breath sounds checked- equal and bilateral Tube secured with: Tape Dental Injury: Teeth and Oropharynx as per pre-operative assessment

## 2015-11-09 NOTE — Anesthesia Postprocedure Evaluation (Signed)
Anesthesia Post Note  Patient: Jeffrey Andrews  Procedure(s) Performed: Procedure(s) (LRB): RIGHT TOTAL KNEE ARTHROPLASTY (Right)  Patient location during evaluation: PACU Anesthesia Type: General Level of consciousness: awake and alert Pain management: pain level controlled Vital Signs Assessment: post-procedure vital signs reviewed and stable Respiratory status: spontaneous breathing, nonlabored ventilation, respiratory function stable and patient connected to nasal cannula oxygen Cardiovascular status: blood pressure returned to baseline and stable Postop Assessment: no signs of nausea or vomiting Anesthetic complications: no    Last Vitals:  Filed Vitals:   11/09/15 0953 11/09/15 1030  BP: 158/77   Pulse: 72   Temp: 36.7 C 36.7 C  Resp: 15     Last Pain:  Filed Vitals:   11/09/15 1042  PainSc: 4                  Isabelle Matt,W. EDMOND

## 2015-11-09 NOTE — Interval H&P Note (Signed)
History and Physical Interval Note:  11/09/2015 6:49 AM  Jeffrey Andrews  has presented today for surgery, with the diagnosis of RIGHT KNEE OA  The various methods of treatment have been discussed with the patient and family. After consideration of risks, benefits and other options for treatment, the patient has consented to  Procedure(s): RIGHT TOTAL KNEE ARTHROPLASTY (Right) as a surgical intervention .  The patient's history has been reviewed, patient examined, no change in status, stable for surgery.  I have reviewed the patient's chart and labs.  Questions were answered to the patient's satisfaction.     Jeffrey Andrews

## 2015-11-09 NOTE — Care Management Note (Signed)
Case Management Note  Patient Details  Name: Jeffrey Andrews MRN: [id number] Date of Birth: [birth year]  Subjective/Objective:             Total knee repair       Action/Plan:Date:  November 09, 2015 Chart reviewed for concurrent status and case management needs. Will continue to follow the patient for changes and needs: Expected discharge date: [id number] Velva Harman, BSN, [place], [place]   Expected Discharge Date:                  Expected Discharge Plan:  [place]  In-House Referral:  NA  Discharge planning Services  CM Consult  Post Acute Care Choice:  Home Health Choice offered to:     DME Arranged:    DME Agency:     HH Arranged:    [place] Agency:     Status of Service:  In process, will continue to follow  Medicare Important Message Given:    Date Medicare IM Given:    Medicare IM give by:    Date Additional Medicare IM Given:    Additional Medicare Important Message give by:     If discussed at [place] of Stay Meetings, dates discussed:    Additional Comments:  Leeroy Cha, RN 11/09/2015, 11:20 AM

## 2015-11-09 NOTE — H&P (View-Only) (Signed)
Jeffrey Andrews DOB: 10-16-1945 Married / Language: English / Race: White Male Date of Admission:  11/09/2015 CC:  Right Knee Pain History of Present Illness The patient is a 70 year old male who comes in today for a preoperative History and Physical. The patient is scheduled for a right total knee arthroplasty to be performed by Dr. Dione Plover. Aluisio, MD at [facility] on 11-09-2015. The patient is a 70 year old male who presented for follow up of their knee. The patient has been followed for their bilateral osteoarthritis. They have had  cortisone injections in the past (he got no relief from any of the injections). Symptoms reported include: pain, pain after sitting, pain at night, swelling, aching and stiffness. The patient feels that they are doing poorly (left knee is very painful) and report their pain level to be moderate. The following medication has been used for pain control: none. The patient has not gotten any relief of their symptoms with Cortisone injections. The patient indicates that they are ready to proceed wtih total knee.  He has already has the left knee replaced and now ready for the right knee. Unfortunately, the cortisone did not help much. He was trying to hold off as long as possible, but he feels like his left knee has gotten to the point where it has essentially taken over his life now. It is limiting what he can and cannot do. He has pain with all activities as well as pain at rest. He is now ready to proceed with surgery. Risks and benefits of the surgery have been discussed with the patient and they elect to proceed with surgery.  There are on active contraindications to upcoming procedure such as ongoing infection or progressive neurological disease.   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 Osteoarthritis  Mother. mother and father Cancer  mother Diabetes Mellitus  sister Father  Deceased, Cerebrovascular Accident. age 74 Mother  Deceased, Congestive Heart Failure. age 41  Social History Tobacco use  Never smoker. 12/10/2013 never smoker; uses 2 or more can(s) smokeless per week Illicit drug use  no Number of flights of stairs before winded  4-5 2-3 Tobacco / smoke exposure  12/10/2013: no no Pain Contract  no Previously in rehab  no Drug/Alcohol Rehab (Currently)  no Alcohol use  never consumed alcohol Exercise  Exercises never Living situation  live with spouse Children  5 or more Current work status  retired No history of drug/alcohol rehab  Not under pain contract  Marital status  married Never consumed alcohol  12/10/2013: Never consumed alcohol Advance Directives  Living Will [place] for home following the Right Total Knee Replacement on 11/09/2015.  Past Surgical History  Cataract Surgery  bilateral Total Knee Replacement - Left [06/08/2015]: Spinal Surgery  3 times Other Surgery  reattachment quad tendon left knee Vasectomy  Tonsillectomy  Left Wrist Surgery    Review of Systems  General Not Present- Chills, Fatigue, Fever, Memory Loss, Night Sweats, Weight Gain and Weight Loss. Skin Not Present- Eczema, Hives, Itching, Lesions and Rash. HEENT Not Present- Dentures, Double Vision, Headache, Hearing Loss, Tinnitus and Visual Loss. Respiratory Not  Present- Allergies, Chronic Cough, Coughing up blood, Shortness of breath at rest and Shortness of breath with exertion. Cardiovascular Not Present- Chest Pain, Difficulty Breathing Lying Down, Murmur, Palpitations, Racing/skipping heartbeats and Swelling. Gastrointestinal Not Present- Abdominal Pain, Bloody Stool, Constipation, Diarrhea, Difficulty Swallowing, Heartburn, Jaundice, Loss of appetitie, Nausea and Vomiting. Male Genitourinary Not Present- Blood in Urine, Discharge, Flank Pain, Incontinence, Painful Urination, Urgency, Urinary frequency, Urinary Retention, Urinating at Night and Weak urinary stream. Musculoskeletal Present- Back Pain and Joint Pain. Not Present- Joint Swelling, Morning Stiffness, Muscle Pain, Muscle Weakness and Spasms. Neurological Not Present- Blackout spells, Difficulty with balance, Dizziness, Paralysis, Tremor and Weakness. Psychiatric Not Present- Insomnia.  Vitals Weight: 230 lb Height: 68in Weight was reported by patient. Height was reported by patient. Body Surface Area: 2.17 m Body Mass Index: 34.97 kg/m  Pulse: 76 (Regular)  BP: 138/80 (Sitting, Right Arm, Standard)  Physical Exam General Mental Status -Alert, cooperative and good historian. General Appearance-pleasant, Not in acute distress. Orientation-Oriented X3. Build & Nutrition-Well nourished and Well developed.  Head and Neck Head-normocephalic, atraumatic . Neck Global Assessment - supple, no bruit auscultated on the right, no bruit auscultated on the left.  Eye Pupil - Bilateral-Regular and Round. Motion - Bilateral-EOMI.  ENMT Note: bilateral hearing aids   Chest and Lung Exam Auscultation Breath sounds - clear at anterior chest wall and clear at posterior chest wall. Adventitious sounds - No Adventitious sounds.  Cardiovascular Auscultation Rhythm - Regular rate and rhythm. Heart Sounds - S1 WNL and S2 WNL. Murmurs & Other Heart Sounds: Murmur 1  - Location - Aortic Area and Pulmonic Area. Timing - Early systolic. Grade - II/VI.  Abdomen Palpation/Percussion Tenderness - Abdomen is non-tender to palpation. Rigidity (guarding) - Abdomen is soft. Auscultation Auscultation of the abdomen reveals - Bowel sounds normal.  Male Genitourinary Note: Not done, not pertinent to present illness   Musculoskeletal Note: Right knee range 0 to 125, moderate crepitus and range of motion. Some tenderness medial greater than lateral with no instability. His left knee shows no swelling. His range is 5 to 122. There is no instability.  RADIOGRAPHS AP and lateral of the right knee shos that he advanced arthritis of the right knee.  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 Dierdre Searles - [facility] - Patient has been seen preoperatively and felt to be stable for surgery. Dr. Shirline Frees  IV TXA  Anesthesia Issues: None  Arlee Muslim, PA-C

## 2015-11-09 NOTE — Op Note (Signed)
Pre-operative diagnosis- Osteoarthritis  Left knee(s)  Post-operative diagnosis- Osteoarthritis Left knee(s)  Procedure-  Left  Total Knee Arthroplasty  Surgeon- Dione Plover. Lizvet Chunn, MD  Assistant- Ardeen Jourdain, PA-C   Anesthesia-  General  EBL-* No blood loss amount entered *   Drains Hemovac  Tourniquet time-  Total Tourniquet Time Documented: Thigh (laterality) - 40 minutes Total: Thigh (laterality) - 40 minutes     Complications- None  Condition-PACU - hemodynamically stable.   Brief Clinical Note  Jeffrey Andrews is a 70 y.o. year old male with end stage OA of his right knee with progressively worsening pain and dysfunction. He has constant pain, with activity and at rest and significant functional deficits with difficulties even with ADLs. He has had extensive non-op management including analgesics, injections of cortisone and viscosupplements, and home exercise program, but remains in significant pain with significant dysfunction. Radiographs show bone on bone arthritis medial and patellofemoral. He presents now for right Total Knee Arthroplasty.    Procedure in detail---   The patient is brought into the operating room and positioned supine on the operating table. After successful administration of  General,   a tourniquet is placed high on the  Right thigh(s) and the lower extremity is prepped and draped in the usual sterile fashion. Time out is performed by the operating team and then the  Right lower extremity is wrapped in Esmarch, knee flexed and the tourniquet inflated to 300 mmHg.       A midline incision is made with a ten blade through the subcutaneous tissue to the level of the extensor mechanism. A fresh blade is used to make a medial parapatellar arthrotomy. Soft tissue over the proximal medial tibia is subperiosteally elevated to the joint line with a knife and into the semimembranosus bursa with a Cobb elevator. Soft tissue over the proximal lateral tibia is  elevated with attention being paid to avoiding the patellar tendon on the tibial tubercle. The patella is everted, knee flexed 90 degrees and the ACL and PCL are removed. Findings are bone on bone medial and patellofemoral with massive global osteophytes.        The drill is used to create a starting hole in the distal femur and the canal is thoroughly irrigated with sterile saline to remove the fatty contents. The 5 degree Right  valgus alignment guide is placed into the femoral canal and the distal femoral cutting block is pinned to remove 9 mm off the distal femur. Resection is made with an oscillating saw.      The tibia is subluxed forward and the menisci are removed. The extramedullary alignment guide is placed referencing proximally at the medial aspect of the tibial tubercle and distally along the second metatarsal axis and tibial crest. The block is pinned to remove 57mm off the more deficient medial  side. Resection is made with an oscillating saw. Size 7is the most appropriate size for the tibia and the proximal tibia is prepared with the modular drill and keel punch for that size.      The femoral sizing guide is placed and size 7 is most appropriate. Rotation is marked off the epicondylar axis and confirmed by creating a rectangular flexion gap at 90 degrees. The size 7 cutting block is pinned in this rotation and the anterior, posterior and chamfer cuts are made with the oscillating saw. The intercondylar block is then placed and that cut is made.      Trial size 7 tibial component,  trial size 7 posterior stabilized femur and a 6  mm posterior stabilized rotating platform insert trial is placed. Full extension is achieved with excellent varus/valgus and anterior/posterior balance throughout full range of motion. The patella is everted and thickness measured to be 22  mm. Free hand resection is taken to 12 mm, a 38 template is placed, lug holes are drilled, trial patella is placed, and it tracks  normally. Osteophytes are removed off the posterior femur with the trial in place. All trials are removed and the cut bone surfaces prepared with pulsatile lavage. Cement is mixed and once ready for implantation, the size 7 tibial implant, size  7 posterior stabilized femoral component, and the size 38 patella are cemented in place and the patella is held with the clamp. The trial insert is placed and the knee held in full extension. The Exparel (20 ml mixed with 30 ml saline) and .25% Bupivicaine, are injected into the extensor mechanism, posterior capsule, medial and lateral gutters and subcutaneous tissues.  All extruded cement is removed and once the cement is hard the permanent 6 mm posterior stabilized rotating platform insert is placed into the tibial tray.      The wound is copiously irrigated with saline solution and the extensor mechanism closed over a hemovac drain with #1 V-loc suture. The tourniquet is released for a total tourniquet time of 40  minutes. Flexion against gravity is 140 degrees and the patella tracks normally. Subcutaneous tissue is closed with 2.0 vicryl and subcuticular with running 4.0 Monocryl. The incision is cleaned and dried and steri-strips and a bulky sterile dressing are applied. The limb is placed into a knee immobilizer and the patient is awakened and transported to recovery in stable condition.      Please note that a surgical assistant was a medical necessity for this procedure in order to perform it in a safe and expeditious manner. Surgical assistant was necessary to retract the ligaments and vital neurovascular structures to prevent injury to them and also necessary for proper positioning of the limb to allow for anatomic placement of the prosthesis.   Dione Plover Ingris Pasquarella, MD    11/09/2015, 9:30 AM

## 2015-11-09 NOTE — Transfer of Care (Signed)
Immediate Anesthesia Transfer of Care Note  Patient: Jeffrey Andrews  Procedure(s) Performed: Procedure(s): RIGHT TOTAL KNEE ARTHROPLASTY (Right)  Patient Location: PACU  Anesthesia Type:General and Spinal  Level of Consciousness: alert  and oriented  Airway & Oxygen Therapy: Patient connected to face mask oxygen  Post-op Assessment: Post -op Vital signs reviewed and stable  Post vital signs: stable  Last Vitals:  Filed Vitals:   11/09/15 0525  BP: 161/74  Pulse: 70  Temp: 37.1 C  Resp: 18    Last Pain: There were no vitals filed for this visit.    Patients Stated Pain Goal: 4 ([id number])  Complications: No apparent anesthesia complications

## 2015-11-09 NOTE — Evaluation (Signed)
Physical Therapy Evaluation Patient Details Name: Jeffrey Andrews MRN: [id number] DOB: 1946-04-21 Today's Date: 11/09/2015   History of Present Illness  70 yo male s/p R TKA 11/09/15. Hx of L TKA 05/2015  Clinical Impression  On eval POD 0, pt was Min guard assist for mobility-walked ~55 feet with RW. Pain rated ~6/10. Will progress as tolerated.     Follow Up Recommendations Home health PT    Equipment Recommendations  None recommended by PT    Recommendations for Other Services       Precautions / Restrictions Precautions Precautions: Fall Required Braces or Orthoses: Knee Immobilizer - Right Knee Immobilizer - Right: Discontinue once straight leg raise with < 10 degree lag Restrictions Weight Bearing Restrictions: No RLE Weight Bearing: Weight bearing as tolerated      Mobility  Bed Mobility Overal bed mobility: Needs Assistance Bed Mobility: Supine to Sit     Supine to sit: Min guard;HOB elevated     General bed mobility comments: close guard for safety, lines.   Transfers Overall transfer level: Needs assistance Equipment used: Rolling walker (2 wheeled) Transfers: Sit to/from Stand Sit to Stand: Min guard         General transfer comment: close guard for safety. VCs safety, hand /LE placement  Ambulation/Gait Ambulation/Gait assistance: Min guard Ambulation Distance (Feet): 55 Feet Assistive device: Rolling walker (2 wheeled) Gait Pattern/deviations: Step-to pattern;Step-through pattern;Decreased stride length     General Gait Details: close guard for safety. VCs safety, sequence.   Stairs            Wheelchair Mobility    Modified Rankin (Stroke Patients Only)       Balance                                             Pertinent Vitals/Pain Pain Assessment: 0-10 Pain Score: 6  Pain Location: R knee Pain Descriptors / Indicators: Aching;Sore Pain Intervention(s): Monitored during session;Ice applied;Repositioned     Home Living Family/patient expects to be discharged to:: Private residence Living Arrangements: Spouse/significant other Available Help at Discharge: Family;Available 24 hours/day Type of Home: House Home Access: Stairs to enter Entrance Stairs-Rails: Left Entrance Stairs-Number of Steps: 3+3 (front) Home Layout: One level Home Equipment: Walker - 2 wheels;Cane - single point      Prior Function Level of Independence: Independent               Hand Dominance   Dominant Hand: Right    Extremity/Trunk Assessment   Upper Extremity Assessment: Defer to OT evaluation           Lower Extremity Assessment: RLE deficits/detail RLE Deficits / Details: Strength at least 3/5    Cervical / Trunk Assessment: Normal  Communication   Communication: No difficulties  Cognition Arousal/Alertness: Awake/alert Behavior During Therapy: WFL for tasks assessed/performed Overall Cognitive Status: Within Functional Limits for tasks assessed                      General Comments      Exercises        Assessment/Plan    PT Assessment Patient needs continued PT services  PT Diagnosis Difficulty walking;Acute pain   PT Problem List Decreased strength;Decreased range of motion;Decreased activity tolerance;Decreased balance;Decreased mobility;Decreased knowledge of use of DME;Pain  PT Treatment Interventions DME instruction;Gait training;Stair training;Functional mobility training;Therapeutic activities;Patient/family education;Therapeutic  exercise;Balance training   PT Goals (Current goals can be found in the Care Plan section) Acute Rehab PT Goals Patient Stated Goal: home.  PT Goal Formulation: With patient Time For Goal Achievement: 11/23/15 Potential to Achieve Goals: Good    Frequency 7X/week   Barriers to discharge        Co-evaluation               End of Session Equipment Utilized During Treatment: Gait belt;Right knee immobilizer Activity  Tolerance: Patient tolerated treatment well Patient left: in chair;with call bell/phone within reach;with chair alarm set           Time: [id number] PT Time Calculation (min) (ACUTE ONLY): 14 min   Charges:   PT Evaluation $PT Eval Low Complexity: 1 Procedure     PT G Codes:        Weston Anna, MPT Pager: [phone]

## 2015-11-10 LAB — BASIC METABOLIC PANEL
Anion gap: 7 (ref 5–15)
BUN: 11 mg/dL (ref 6–20)
CHLORIDE: 106 mmol/L (ref 101–111)
CO2: 24 mmol/L (ref 22–32)
CREATININE: 0.84 mg/dL (ref 0.61–1.24)
Calcium: 8.1 mg/dL — ABNORMAL LOW (ref 8.9–10.3)
GFR calc Af Amer: >60 mL/min (ref >60)
GFR calc non Af Amer: >60 mL/min (ref >60)
GLUCOSE: 161 mg/dL — AB (ref 65–99)
POTASSIUM: 3.5 mmol/L (ref 3.5–5.1)
SODIUM: 137 mmol/L (ref 135–145)

## 2015-11-10 LAB — CBC
HCT: 36.5 % — ABNORMAL LOW (ref 39.0–52.0)
HEMOGLOBIN: 12.8 g/dL — AB (ref 13.0–17.0)
MCH: 31.4 pg (ref 26.0–34.0)
MCHC: 35.1 g/dL (ref 30.0–36.0)
MCV: 89.7 fL (ref 78.0–100.0)
Platelets: 217 10*3/uL (ref 150–400)
RBC: 4.07 MIL/uL — ABNORMAL LOW (ref 4.22–5.81)
RDW: 13.1 % (ref 11.5–15.5)
WBC: 18.4 10*3/uL — ABNORMAL HIGH (ref 4.0–10.5)

## 2015-11-10 MED ORDER — METHOCARBAMOL 500 MG PO TABS: 500.0000 mg | ORAL_TABLET | Freq: Four times a day (QID) | ORAL | Status: DC | PRN

## 2015-11-10 MED ORDER — OXYCODONE HCL 5 MG PO TABS: 5.0000 mg | ORAL_TABLET | ORAL | Status: DC | PRN

## 2015-11-10 MED ORDER — TRAMADOL HCL 50 MG PO TABS: 50.0000 mg | ORAL_TABLET | Freq: Four times a day (QID) | ORAL | Status: DC | PRN

## 2015-11-10 MED ORDER — ALUM & MAG HYDROXIDE-SIMETH 200-200-20 MG/5ML PO SUSP
30.0000 mL | ORAL | Status: DC | PRN
  Administered 2015-11-10: 30 mL via ORAL
  Filled 2015-11-10: qty 30

## 2015-11-10 MED ORDER — RIVAROXABAN 10 MG PO TABS: 10.0000 mg | ORAL_TABLET | Freq: Every day | ORAL | Status: DC

## 2015-11-10 NOTE — Progress Notes (Signed)
   Subjective: 1 Day Post-Op Procedure(s) (LRB): RIGHT TOTAL KNEE ARTHROPLASTY (Right) Patient reports pain as mild.   Patient seen in rounds with Dr. Wynelle Link. Patient is well, and has had no acute complaints or problems. Did well with therapy yesterday. No SOB or chest pain.  Plan is to go Home after hospital stay.  Objective: Vital signs in last 24 hours: Temp:  [98 F (36.7 C)-99 F (37.2 C)] 98.3 F (36.8 C) (06/13 0603) Pulse Rate:  [70-95] 72 (06/13 0603) Resp:  [14-18] 18 (06/13 0603) BP: (129-182)/(50-90) 158/68 mmHg (06/13 0603) SpO2:  [93 %-99 %] 98 % (06/13 0603)  Intake/Output from previous day:  Intake/Output Summary (Last 24 hours) at 11/10/15 0801 Last data filed at 11/10/15 0555  Gross per 24 hour  Intake   4030 ml  Output   3875 ml  Net    155 ml     Labs:  Recent Labs  11/10/15 0423  HGB 12.8*    Recent Labs  11/10/15 0423  WBC 18.4*  RBC 4.07*  HCT 36.5*  PLT 217    Recent Labs  11/10/15 0423  NA 137  K 3.5  CL 106  CO2 24  BUN 11  CREATININE 0.84  GLUCOSE 161*  CALCIUM 8.1*    EXAM General - Patient is Alert and Oriented Extremity - Neurologically intact Intact pulses distally Dorsiflexion/Plantar flexion intact No cellulitis present Compartment soft Dressing - dressing C/D/I Motor Function - intact, moving foot and toes well on exam.  Hemovac pulled without difficulty.  Past Medical History  Diagnosis Date  . Excessive sweating   . Hypertension   . Heart murmur     "functional heart murmur"  . Arthritis   . History of skin cancer   . History of kidney stones   . Sleep apnea     uses c-pap  . Cancer ([place]) 2014    skin cancer left arm    Assessment/Plan: 1 Day Post-Op Procedure(s) (LRB): RIGHT TOTAL KNEE ARTHROPLASTY (Right) Active Problems:   OA (osteoarthritis) of knee  Estimated body mass index is 37.95 kg/(m^2) as calculated from the following:   Height as of this encounter: 5\' 6"  (1.676 m).   Weight  as of this encounter: 106.595 kg (235 lb). Advance diet Up with therapy D/C IV fluids when tolerating POs well  DVT Prophylaxis - Xarelto Weight-Bearing as tolerated D/C O2 and Pulse OX and try on Room Air  Will continue therapy today. Plan for DC home this afternoon if he progresses well with therapy.   Ardeen Jourdain, PA-C Orthopaedic Surgery 11/10/2015, 8:01 AM

## 2015-11-10 NOTE — Progress Notes (Signed)
Physical Therapy Treatment Patient Details Name: Jeffrey Andrews MRN: [id number] DOB: Mar 06, 1946 Today's Date: 11/10/2015    History of Present Illness 70 yo male s/p R TKA 11/09/15. Hx of L TKA 05/2015    PT Comments    Progressing well with mobility. Will see a 2nd time prior to d/c later today.   Follow Up Recommendations  Home health PT     Equipment Recommendations  None recommended by PT    Recommendations for Other Services       Precautions / Restrictions Precautions Precautions: Fall Required Braces or Orthoses: Knee Immobilizer - Right Knee Immobilizer - Right: Discontinue once straight leg raise with < 10 degree lag Restrictions Weight Bearing Restrictions: No RLE Weight Bearing: Weight bearing as tolerated    Mobility  Bed Mobility Overal bed mobility: Needs Assistance Bed Mobility: Supine to Sit     Supine to sit: Supervision        Transfers Overall transfer level: Needs assistance Equipment used: Rolling walker (2 wheeled) Transfers: Sit to/from Stand Sit to Stand: Supervision         General transfer comment:  VCs safety, hand /LE placement  Ambulation/Gait Ambulation/Gait assistance: Supervision Ambulation Distance (Feet): 85 Feet Assistive device: Rolling walker (2 wheeled) Gait Pattern/deviations: Step-to pattern;Step-through pattern;Decreased stride length;Decreased step length - right;Antalgic     General Gait Details: slow gait speed. cues for R foot flat instead of on ball of foot.    Stairs            Wheelchair Mobility    Modified Rankin (Stroke Patients Only)       Balance                                    Cognition Arousal/Alertness: Awake/alert Behavior During Therapy: WFL for tasks assessed/performed Overall Cognitive Status: Within Functional Limits for tasks assessed                      Exercises Total Joint Exercises Ankle Circles/Pumps: AROM;Both;10 reps;Supine Quad Sets:  AROM;Both;10 reps;Supine Hip ABduction/ADduction: AROM;Right;10 reps;Supine Straight Leg Raises: AROM;Right;10 reps;Supine Knee Flexion: AROM;Right;10 reps;Seated Goniometric ROM: ~10-80 degrees    General Comments        Pertinent Vitals/Pain Pain Assessment: 0-10 Pain Score: 5  Pain Location: R knee with activity Pain Descriptors / Indicators: Sore;Aching Pain Intervention(s): Monitored during session;Ice applied;Repositioned    Home Living                      Prior Function            PT Goals (current goals can now be found in the care plan section) Progress towards PT goals: Progressing toward goals    Frequency  7X/week    PT Plan Current plan remains appropriate    Co-evaluation             End of Session Equipment Utilized During Treatment: Gait belt Activity Tolerance: Patient tolerated treatment well Patient left: in chair;with call bell/phone within reach     Time: 0950-1008 PT Time Calculation (min) (ACUTE ONLY): 18 min  Charges:  $Gait Training: 8-22 mins                    G Codes:      Weston Anna, MPT Pager: [phone]

## 2015-11-10 NOTE — Care Management Note (Addendum)
Case Management Note  Patient Details  Name: Jeffrey Andrews MRN: [id number] Date of Birth: [birth year]  Subjective/Objective:                  RIGHT TOTAL KNEE ARTHROPLASTY (Right) Action/Plan: Discharge planning Expected Discharge Date:  11/10/15               Expected Discharge Plan:  [place]  In-House Referral:  NA  Discharge planning Services  CM Consult  Post Acute Care Choice:  Home Health Choice offered to:  Patient  DME Arranged:  N/A DME Agency:  NA  HH Arranged:  PT [place] Agency:  [place]  Status of Service:  Completed, signed off  Medicare Important Message Given:    Date Medicare IM Given:    Medicare IM give by:    Date Additional Medicare IM Given:    Additional Medicare Important Message give by:     If discussed at [place] of Stay Meetings, dates discussed:    Additional Comments: CM met with pt in room to offer choice of home health agency.  Pt chooses [facility] to render HHPT.  Pt states he has all DME at home and does not need additional DME.  Referral given to [facility] rep, Santiago Glad.  NO other CM needs were communicated. Dellie Catholic, RN 11/10/2015, 10:57 AM

## 2015-11-10 NOTE — Discharge Instructions (Addendum)
° °Dr. Frank Aluisio °Total Joint Specialist °[facility] °[address] °, [place] [address] °[phone] ° °TOTAL KNEE REPLACEMENT POSTOPERATIVE DIRECTIONS ° °Knee Rehabilitation, Guidelines Following Surgery  °Results after knee surgery are often greatly improved when you follow the exercise, range of motion and muscle strengthening exercises prescribed by your doctor. Safety measures are also important to protect the knee from further injury. Any time any of these exercises cause you to have increased pain or swelling in your knee joint, decrease the amount until you are comfortable again and slowly increase them. If you have problems or questions, call your caregiver or physical therapist for advice.  ° °HOME CARE INSTRUCTIONS  °Remove items at home which could result in a fall. This includes throw rugs or furniture in walking pathways.  °· ICE to the affected knee every three hours for 30 minutes at a time and then as needed for pain and swelling.  Continue to use ice on the knee for pain and swelling from surgery. You may notice swelling that will progress down to the foot and ankle.  This is normal after surgery.  Elevate the leg when you are not up walking on it.   °· Continue to use the breathing machine which will help keep your temperature down.  It is common for your temperature to cycle up and down following surgery, especially at night when you are not up moving around and exerting yourself.  The breathing machine keeps your lungs expanded and your temperature down. °· Do not place pillow under knee, focus on keeping the knee straight while resting ° °DIET °You may resume your previous home diet once your are discharged from the hospital. ° °DRESSING / WOUND CARE / SHOWERING °You may start showering once you are discharged home but do not submerge the incision under water. Just pat the incision dry and apply a dry gauze dressing on daily. °Change the surgical dressing  daily and reapply a dry dressing each time. ° °ACTIVITY °Walk with your walker as instructed. °Use walker as long as suggested by your caregivers. °Avoid periods of inactivity such as sitting longer than an hour when not asleep. This helps prevent blood clots.  °You may resume a sexual relationship in one month or when given the OK by your doctor.  °You may return to work once you are cleared by your doctor.  °Do not drive a car for 6 weeks or until released by you surgeon.  °Do not drive while taking narcotics. ° °WEIGHT BEARING °Weight bearing as tolerated with assist device (walker, cane, etc) as directed, use it as long as suggested by your surgeon or therapist, typically at least 4-6 weeks. ° °POSTOPERATIVE CONSTIPATION PROTOCOL °Constipation - defined medically as fewer than three stools per week and severe constipation as less than one stool per week. ° °One of the most common issues patients have following surgery is constipation.  Even if you have a regular bowel pattern at home, your normal regimen is likely to be disrupted due to multiple reasons following surgery.  Combination of anesthesia, postoperative narcotics, change in appetite and fluid intake all can affect your bowels.  In order to avoid complications following surgery, here are some recommendations in order to help you during your recovery period. ° °Colace (docusate) - Pick up an over-the-counter form of Colace or another stool softener and take twice a day as long as you are requiring postoperative pain medications.  Take with a full glass of water   daily.  If you experience loose stools or diarrhea, hold the colace until you stool forms back up.  If your symptoms do not get better within 1 week or if they get worse, check with your doctor. ° °Dulcolax (bisacodyl) - Pick up over-the-counter and take as directed by the product packaging as needed to assist with the movement of your bowels.  Take with a full glass of water.  Use this product as  needed if not relieved by Colace only.  ° °MiraLax (polyethylene glycol) - Pick up over-the-counter to have on hand.  MiraLax is a solution that will increase the amount of water in your bowels to assist with bowel movements.  Take as directed and can mix with a glass of water, juice, soda, coffee, or tea.  Take if you go more than two days without a movement. °Do not use MiraLax more than once per day. Call your doctor if you are still constipated or irregular after using this medication for 7 days in a row. ° °If you continue to have problems with postoperative constipation, please contact the office for further assistance and recommendations.  If you experience "the worst abdominal pain ever" or develop nausea or vomiting, please contact the office immediatly for further recommendations for treatment. ° °ITCHING ° If you experience itching with your medications, try taking only a single pain pill, or even half a pain pill at a time.  You can also use Benadryl over the counter for itching or also to help with sleep.  ° °TED HOSE STOCKINGS °Wear the elastic stockings on both legs for three weeks following surgery during the day but you may remove then at night for sleeping. ° °MEDICATIONS °See your medication summary on the “After Visit Summary” that the nursing staff will review with you prior to discharge.  You may have some home medications which will be placed on hold until you complete the course of blood thinner medication.  It is important for you to complete the blood thinner medication as prescribed by your surgeon.  Continue your approved medications as instructed at time of discharge. ° °PRECAUTIONS °If you experience chest pain or shortness of breath - call 911 immediately for transfer to the hospital emergency department.  °If you develop a fever greater that 101 F, purulent drainage from wound, increased redness or drainage from wound, foul odor from the wound/dressing, or calf pain - CONTACT YOUR  SURGEON.   °                                                °FOLLOW-UP APPOINTMENTS °Make sure you keep all of your appointments after your operation with your surgeon and caregivers. You should call the office at the above phone number and make an appointment for approximately two weeks after the date of your surgery or on the date instructed by your surgeon outlined in the "After Visit Summary". ° ° °RANGE OF MOTION AND STRENGTHENING EXERCISES  °Rehabilitation of the knee is important following a knee injury or an operation. After just a few days of immobilization, the muscles of the thigh which control the knee become weakened and shrink (atrophy). Knee exercises are designed to build up the tone and strength of the thigh muscles and to improve knee motion. Often times heat used for twenty to thirty minutes before working out will loosen   up your tissues and help with improving the range of motion but do not use heat for the first two weeks following surgery. These exercises can be done on a training (exercise) mat, on the floor, on a table or on a bed. Use what ever works the best and is most comfortable for you Knee exercises include:  °Leg Lifts - While your knee is still immobilized in a splint or cast, you can do straight leg raises. Lift the leg to 60 degrees, hold for 3 sec, and slowly lower the leg. Repeat 10-20 times 2-3 times daily. Perform this exercise against resistance later as your knee gets better.  °Quad and Hamstring Sets - Tighten up the muscle on the front of the thigh (Quad) and hold for 5-10 sec. Repeat this 10-20 times hourly. Hamstring sets are done by pushing the foot backward against an object and holding for 5-10 sec. Repeat as with quad sets.  °· Leg Slides: Lying on your back, slowly slide your foot toward your buttocks, bending your knee up off the floor (only go as far as is comfortable). Then slowly slide your foot back down until your leg is flat on the floor again. °· Angel Wings:  Lying on your back spread your legs to the side as far apart as you can without causing discomfort.  °A rehabilitation program following serious knee injuries can speed recovery and prevent re-injury in the future due to weakened muscles. Contact your doctor or a physical therapist for more information on knee rehabilitation.  ° °IF YOU ARE TRANSFERRED TO A SKILLED REHAB FACILITY °If the patient is transferred to a skilled rehab facility following release from the hospital, a list of the current medications will be sent to the facility for the patient to continue.  When discharged from the skilled rehab facility, please have the facility set up the patient's Home Health Physical Therapy prior to being released. Also, the skilled facility will be responsible for providing the patient with their medications at time of release from the facility to include their pain medication, the muscle relaxants, and their blood thinner medication. If the patient is still at the rehab facility at time of the two week follow up appointment, the skilled rehab facility will also need to assist the patient in arranging follow up appointment in our office and any transportation needs. ° °MAKE SURE YOU:  °Understand these instructions.  °Get help right away if you are not doing well or get worse.  ° ° °Pick up stool softner and laxative for home use following surgery while on pain medications. °Do not submerge incision under water. °Please use good hand washing techniques while changing dressing each day. °May shower starting three days after surgery. °Please use a clean towel to pat the incision dry following showers. °Continue to use ice for pain and swelling after surgery. °Do not use any lotions or creams on the incision until instructed by your surgeon. ° °Information on my medicine - XARELTO® (Rivaroxaban) ° °This medication education was reviewed with me or my healthcare representative as part of my discharge preparation.  The  pharmacist that spoke with me during my hospital stay was:  Marijayne Rauth L, RPH ° °Why was Xarelto® prescribed for you? °Xarelto® was prescribed for you to reduce the risk of blood clots forming after orthopedic surgery. The medical term for these abnormal blood clots is venous thromboembolism (VTE). ° °What do you need to know about xarelto® ? °Take your Xarelto® ONCE DAILY at   the same time every day. °You may take it either with or without food. ° °If you have difficulty swallowing the tablet whole, you may crush it and mix in applesauce just prior to taking your dose. ° °Take Xarelto® exactly as prescribed by your doctor and DO NOT stop taking Xarelto® without talking to the doctor who prescribed the medication.  Stopping without other VTE prevention medication to take the place of Xarelto® may increase your risk of developing a clot. ° °After discharge, you should have regular check-up appointments with your healthcare provider that is prescribing your Xarelto®.   ° °What do you do if you miss a dose? °If you miss a dose, take it as soon as you remember on the same day then continue your regularly scheduled once daily regimen the next day. Do not take two doses of Xarelto® on the same day.  ° °Important Safety Information °A possible side effect of Xarelto® is bleeding. You should call your healthcare provider right away if you experience any of the following: °? Bleeding from an injury or your nose that does not stop. °? Unusual colored urine (red or dark brown) or unusual colored stools (red or black). °? Unusual bruising for unknown reasons. °? A serious fall or if you hit your head (even if there is no bleeding). ° °Some medicines may interact with Xarelto® and might increase your risk of bleeding while on Xarelto®. To help avoid this, consult your healthcare provider or pharmacist prior to using any new prescription or non-prescription medications, including herbals, vitamins, non-steroidal  anti-inflammatory drugs (NSAIDs) and supplements. ° °This website has more information on Xarelto®: www.xarelto.com. ° ° ° °

## 2015-11-10 NOTE — Progress Notes (Signed)
Physical Therapy Treatment Patient Details Name: Jeffrey Andrews MRN: [id number] DOB: 12/13/1945 Today's Date: 11/10/2015    History of Present Illness 70 yo male s/p R TKA 11/09/15. 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 All education completed. Ready to d/c from PT standpoint.   Follow Up Recommendations  Home health PT     Equipment Recommendations  None recommended by PT    Recommendations for Other Services       Precautions / Restrictions Precautions Precautions: Fall Required Braces or Orthoses: Knee Immobilizer - Right Knee Immobilizer - Right: Discontinue once straight leg raise with < 10 degree lag (no KI used today) Restrictions Weight Bearing Restrictions: No RLE Weight Bearing: Weight bearing as tolerated    Mobility  Bed Mobility Overal bed mobility: Needs Assistance Bed Mobility: Supine to Sit     Supine to sit: Supervision     General bed mobility comments: oob in recliner  Transfers Overall transfer level: Needs assistance Equipment used: Rolling walker (2 wheeled) Transfers: Sit to/from Stand Sit to Stand: Supervision         General transfer comment: for safety.   Ambulation/Gait Ambulation/Gait assistance: Supervision Ambulation Distance (Feet): 85 Feet Assistive device: Rolling walker (2 wheeled) Gait Pattern/deviations: Step-to pattern;Step-through pattern;Decreased stride length;Decreased step length - right;Antalgic     General Gait Details: slow gait speed. cues for R foot flat instead of on ball of foot.    Stairs Stairs: Yes Stairs assistance: Min assist Stair Management: Step to pattern;Forwards;Two rails Number of Stairs: 2 General stair comments: Small amount of assist to stabilize. VCs safety, technique, sequence.   Wheelchair Mobility    Modified Rankin (Stroke Patients Only)       Balance                                     Cognition Arousal/Alertness: Awake/alert Behavior During Therapy: WFL for tasks assessed/performed Overall Cognitive Status: Within Functional Limits for tasks assessed                      Exercises     General Comments        Pertinent Vitals/Pain Pain Assessment: 0-10 Pain Score: 6  Pain Location: R knee Pain Descriptors / Indicators: Aching;Sore Pain Intervention(s): Monitored during session;Repositioned    Home Living Family/patient expects to be discharged to:: Private residence Living Arrangements: Spouse/significant other Available Help at Discharge: Family;Available 24 hours/day Type of Home: House Home Access: Stairs to enter Entrance Stairs-Rails: Left Home Layout: One level Home Equipment: Walker - 2 wheels;Cane - single point Additional Comments: can borrow BSC from church    Prior Function Level of Independence: Independent          PT Goals (current goals can now be found in the care plan section) Acute Rehab PT Goals Patient Stated Goal: home.  Progress towards PT goals: Progressing toward goals    Frequency  7X/week    PT Plan Current plan remains appropriate    Co-evaluation             End of Session Equipment Utilized During Treatment: Gait belt Activity Tolerance: Patient tolerated treatment well Patient left: in chair;with call bell/phone within reach     Time: 1159-1208 PT Time Calculation (min) (ACUTE ONLY): 9 min  Charges:  $Gait Training: 8-22 mins                    G Codes:      Weston Anna, MPT Pager: [phone]

## 2015-11-10 NOTE — Progress Notes (Signed)
Refuses CPAP for tonight

## 2015-11-10 NOTE — Discharge Summary (Signed)
Physician Discharge Summary   Patient ID: Jeffrey Andrews MRN: [id number] DOB/AGE: 1945-12-04 70 y.o.  Admit date: 11/09/2015 Discharge date: 11/10/2015  Primary Diagnosis: Primary osteoarthritis right knee  Admission Diagnoses:  Past Medical History  Diagnosis Date  . Excessive sweating   . Hypertension   . Heart murmur     "functional heart murmur"  . Arthritis   . History of skin cancer   . History of kidney stones   . Sleep apnea     uses c-pap  . Cancer [facility]) 2014    skin cancer left arm   Discharge Diagnoses:   Active Problems:   OA (osteoarthritis) of knee  Estimated body mass index is 37.95 kg/(m^2) as calculated from the following:   Height as of this encounter: 5' 6"  (1.676 m).   Weight as of this encounter: 106.595 kg (235 lb).  Procedure:  Procedure(s) (LRB): RIGHT TOTAL KNEE ARTHROPLASTY (Right)   Consults: None  HPI: The patient is a 70 year old male who  has been followed for their bilateral osteoarthritis. They have had cortisone injections in the past (he got no relief from any of the injections). Symptoms reported include: pain, pain after sitting, pain at night, swelling, aching and stiffness. The patient feels that they are doing poorly (left knee is very painful) and report their pain level to be moderate. The following medication has been used for pain control: none. The patient has not gotten any relief of their symptoms with Cortisone injections. The patient indicates that they are ready to proceed wtih total knee. He has already has the left knee replaced and now ready for the right knee. Unfortunately, the cortisone did not help much. He was trying to hold off as long as possible, but he feels like his left knee has gotten to the point where it has essentially taken over his life now. It is limiting what he can and cannot do. He has pain with all activities as well as pain at rest. He is now ready to proceed with surgery. Risks and benefits of the  surgery have been discussed with the patient and they elect to proceed with surgery. There are on active contraindications to upcoming procedure such as ongoing infection or progressive neurological disease.  Laboratory Data: Admission on 11/09/2015, Discharged on 11/10/2015  Component Date Value Ref Range Status  . WBC 11/10/2015 18.4* 4.0 - 10.5 K/uL Final  . RBC 11/10/2015 4.07* 4.22 - 5.81 MIL/uL Final  . Hemoglobin 11/10/2015 12.8* 13.0 - 17.0 g/dL Final  . HCT 11/10/2015 36.5* 39.0 - 52.0 % Final  . MCV 11/10/2015 89.7  78.0 - 100.0 fL Final  . MCH 11/10/2015 31.4  26.0 - 34.0 pg Final  . MCHC 11/10/2015 35.1  30.0 - 36.0 g/dL Final  . RDW 11/10/2015 13.1  11.5 - 15.5 % Final  . Platelets 11/10/2015 217  150 - 400 K/uL Final  . Sodium 11/10/2015 137  135 - 145 mmol/L Final  . Potassium 11/10/2015 3.5  3.5 - 5.1 mmol/L Final  . Chloride 11/10/2015 106  101 - 111 mmol/L Final  . CO2 11/10/2015 24  22 - 32 mmol/L Final  . Glucose, Bld 11/10/2015 161* 65 - 99 mg/dL Final  . BUN 11/10/2015 11  6 - 20 mg/dL Final  . Creatinine, Ser 11/10/2015 0.84  0.61 - 1.24 mg/dL Final  . Calcium 11/10/2015 8.1* 8.9 - 10.3 mg/dL Final  . GFR calc non Af Amer 11/10/2015 >60  >60 mL/min Final  .  GFR calc Af Amer 11/10/2015 >60  >60 mL/min Final   Comment: (NOTE) The eGFR has been calculated using the CKD EPI equation. This calculation has not been validated in all clinical situations. eGFR's persistently <60 mL/min signify possible Chronic Kidney Disease.   Georgiann Hahn gap 11/10/2015 7  5 - 15 Final  Hospital Outpatient Visit on 10/30/2015  Component Date Value Ref Range Status  . Sodium 10/30/2015 137  135 - 145 mmol/L Final  . Potassium 10/30/2015 3.7  3.5 - 5.1 mmol/L Final  . Chloride 10/30/2015 103  101 - 111 mmol/L Final  . CO2 10/30/2015 25  22 - 32 mmol/L Final  . Glucose, Bld 10/30/2015 140* 65 - 99 mg/dL Final  . BUN 10/30/2015 16  6 - 20 mg/dL Final  . Creatinine, Ser 10/30/2015 0.84   0.61 - 1.24 mg/dL Final  . Calcium 10/30/2015 9.2  8.9 - 10.3 mg/dL Final  . GFR calc non Af Amer 10/30/2015 >60  >60 mL/min Final  . GFR calc Af Amer 10/30/2015 >60  >60 mL/min Final   Comment: (NOTE) The eGFR has been calculated using the CKD EPI equation. This calculation has not been validated in all clinical situations. eGFR's persistently <60 mL/min signify possible Chronic Kidney Disease.   . Anion gap 10/30/2015 9  5 - 15 Final  . WBC 10/30/2015 8.6  4.0 - 10.5 K/uL Final  . RBC 10/30/2015 4.94  4.22 - 5.81 MIL/uL Final  . Hemoglobin 10/30/2015 15.6  13.0 - 17.0 g/dL Final  . HCT 10/30/2015 44.0  39.0 - 52.0 % Final  . MCV 10/30/2015 89.1  78.0 - 100.0 fL Final  . MCH 10/30/2015 31.6  26.0 - 34.0 pg Final  . MCHC 10/30/2015 35.5  30.0 - 36.0 g/dL Final  . RDW 10/30/2015 13.2  11.5 - 15.5 % Final  . Platelets 10/30/2015 227  150 - 400 K/uL Final  . ABO/RH(D) 10/30/2015 A POS   Final  . Antibody Screen 10/30/2015 NEG   Final  . Sample Expiration 10/30/2015 11/12/2015   Final  . Extend sample reason 10/30/2015 NO TRANSFUSIONS OR PREGNANCY IN THE PAST 3 MONTHS   Final  . MRSA, PCR 10/30/2015 NEGATIVE  NEGATIVE Final  . Staphylococcus aureus 10/30/2015 POSITIVE* NEGATIVE Final   Comment:        The Xpert SA Assay (FDA approved for NASAL specimens in patients over 74 years of age), is one component of a comprehensive surveillance program.  Test performance has been validated by [facility] for patients greater than or equal to 44 year old. It is not intended to diagnose infection nor to guide or monitor treatment.      X-Rays:No results found.  EKG: Orders placed or performed during the hospital encounter of 10/30/15  . EKG 12 lead  . EKG 12 lead     Hospital Course: Jeffrey Andrews is a 70 y.o. who was admitted to [facility]. They were brought to the operating room on 11/09/2015 and underwent Procedure(s): RIGHT TOTAL KNEE ARTHROPLASTY.  Patient tolerated  the procedure well and was later transferred to the recovery room and then to the orthopaedic floor for postoperative care.  They were given PO and IV analgesics for pain control following their surgery.  They were given 24 hours of postoperative antibiotics of  Anti-infectives    Start     Dose/Rate Route Frequency Ordered Stop   11/09/15 1400  ceFAZolin (ANCEF) IVPB 2g/100 mL premix     2  g 200 mL/hr over 30 Minutes Intravenous Every 6 hours 11/09/15 1112 11/09/15 2037   11/09/15 0627  ceFAZolin (ANCEF) IVPB 2g/100 mL premix     2 g 200 mL/hr over 30 Minutes Intravenous On call to O.R. 11/09/15 [id number] 11/09/15 0815     and started on DVT prophylaxis in the form of Xarelto.   PT and OT were ordered for total joint protocol.  Discharge planning consulted to help with postop disposition and equipment needs.  Patient had a good night on the evening of surgery.  They started to get up OOB with therapy on day one. Hemovac drain was pulled without difficulty.  The patient had progressed with therapy and meeting their goals.  Patient was seen in rounds and was ready to go home.   Diet: Cardiac diet Activity:WBAT Follow-up:in 2 weeks Disposition - Home Discharged Condition: stable   Discharge Instructions    Call MD / Call 911    Complete by:  As directed   If you experience chest pain or shortness of breath, CALL 911 and be transported to the hospital emergency room.  If you develope a fever above 101 F, pus (white drainage) or increased drainage or redness at the wound, or calf pain, call your surgeon's office.     Constipation Prevention    Complete by:  As directed   Drink plenty of fluids.  Prune juice may be helpful.  You may use a stool softener, such as Colace (over the counter) 100 mg twice a day.  Use MiraLax (over the counter) for constipation as needed.     Diet - low sodium heart healthy    Complete by:  As directed      Discharge instructions    Complete by:  As directed   Dr.  Gaynelle Arabian Total Joint Specialist [facility] [address] [place], [place] [address] [phone]  TOTAL KNEE REPLACEMENT POSTOPERATIVE DIRECTIONS  Knee Rehabilitation, Guidelines Following Surgery  Results after knee surgery are often greatly improved when you follow the exercise, range of motion and muscle strengthening exercises prescribed by your doctor. Safety measures are also important to protect the knee from further injury. Any time any of these exercises cause you to have increased pain or swelling in your knee joint, decrease the amount until you are comfortable again and slowly increase them. If you have problems or questions, call your caregiver or physical therapist for advice.   HOME CARE INSTRUCTIONS  Remove items at home which could result in a fall. This includes throw rugs or furniture in walking pathways.  ICE to the affected knee every three hours for 30 minutes at a time and then as needed for pain and swelling.  Continue to use ice on the knee for pain and swelling from surgery. You may notice swelling that will progress down to the foot and ankle.  This is normal after surgery.  Elevate the leg when you are not up walking on it.   Continue to use the breathing machine which will help keep your temperature down.  It is common for your temperature to cycle up and down following surgery, especially at night when you are not up moving around and exerting yourself.  The breathing machine keeps your lungs expanded and your temperature down. Do not place pillow under knee, focus on keeping the knee straight while resting  DIET You may resume your previous home diet once your are discharged from the hospital.  DRESSING / WOUND CARE /  SHOWERING You may start showering once you are discharged home but do not submerge the incision under water. Just pat the incision dry and apply a dry gauze dressing on daily. Change the surgical dressing daily and  reapply a dry dressing each time.  ACTIVITY Walk with your walker as instructed. Use walker as long as suggested by your caregivers. Avoid periods of inactivity such as sitting longer than an hour when not asleep. This helps prevent blood clots.  You may resume a sexual relationship in one month or when given the OK by your doctor.  You may return to work once you are cleared by your doctor.  Do not drive a car for 6 weeks or until released by you surgeon.  Do not drive while taking narcotics.  WEIGHT BEARING Weight bearing as tolerated with assist device (walker, cane, etc) as directed, use it as long as suggested by your surgeon or therapist, typically at least 4-6 weeks.  POSTOPERATIVE CONSTIPATION PROTOCOL Constipation - defined medically as fewer than three stools per week and severe constipation as less than one stool per week.  One of the most common issues patients have following surgery is constipation.  Even if you have a regular bowel pattern at home, your normal regimen is likely to be disrupted due to multiple reasons following surgery.  Combination of anesthesia, postoperative narcotics, change in appetite and fluid intake all can affect your bowels.  In order to avoid complications following surgery, here are some recommendations in order to help you during your recovery period.  Colace (docusate) - Pick up an over-the-counter form of Colace or another stool softener and take twice a day as long as you are requiring postoperative pain medications.  Take with a full glass of water daily.  If you experience loose stools or diarrhea, hold the colace until you stool forms back up.  If your symptoms do not get better within 1 week or if they get worse, check with your doctor.  Dulcolax (bisacodyl) - Pick up over-the-counter and take as directed by the product packaging as needed to assist with the movement of your bowels.  Take with a full glass of water.  Use this product as needed if  not relieved by Colace only.   MiraLax (polyethylene glycol) - Pick up over-the-counter to have on hand.  MiraLax is a solution that will increase the amount of water in your bowels to assist with bowel movements.  Take as directed and can mix with a glass of water, juice, soda, coffee, or tea.  Take if you go more than two days without a movement. Do not use MiraLax more than once per day. Call your doctor if you are still constipated or irregular after using this medication for 7 days in a row.  If you continue to have problems with postoperative constipation, please contact the office for further assistance and recommendations.  If you experience "the worst abdominal pain ever" or develop nausea or vomiting, please contact the office immediatly for further recommendations for treatment.  ITCHING  If you experience itching with your medications, try taking only a single pain pill, or even half a pain pill at a time.  You can also use Benadryl over the counter for itching or also to help with sleep.   TED HOSE STOCKINGS Wear the elastic stockings on both legs for three weeks following surgery during the day but you may remove then at night for sleeping.  MEDICATIONS See your medication summary on the "  After Visit Summary" that the nursing staff will review with you prior to discharge.  You may have some home medications which will be placed on hold until you complete the course of blood thinner medication.  It is important for you to complete the blood thinner medication as prescribed by your surgeon.  Continue your approved medications as instructed at time of discharge.  PRECAUTIONS If you experience chest pain or shortness of breath - call 911 immediately for transfer to the hospital emergency department.  If you develop a fever greater that 101 F, purulent drainage from wound, increased redness or drainage from wound, foul odor from the wound/dressing, or calf pain - CONTACT YOUR SURGEON.                                                    FOLLOW-UP APPOINTMENTS Make sure you keep all of your appointments after your operation with your surgeon and caregivers. You should call the office at the above phone number and make an appointment for approximately two weeks after the date of your surgery or on the date instructed by your surgeon outlined in the "After Visit Summary".   RANGE OF MOTION AND STRENGTHENING EXERCISES  Rehabilitation of the knee is important following a knee injury or an operation. After just a few days of immobilization, the muscles of the thigh which control the knee become weakened and shrink (atrophy). Knee exercises are designed to build up the tone and strength of the thigh muscles and to improve knee motion. Often times heat used for twenty to thirty minutes before working out will loosen up your tissues and help with improving the range of motion but do not use heat for the first two weeks following surgery. These exercises can be done on a training (exercise) mat, on the floor, on a table or on a bed. Use what ever works the best and is most comfortable for you Knee exercises include:  Leg Lifts - While your knee is still immobilized in a splint or cast, you can do straight leg raises. Lift the leg to 60 degrees, hold for 3 sec, and slowly lower the leg. Repeat 10-20 times 2-3 times daily. Perform this exercise against resistance later as your knee gets better.  Quad and Hamstring Sets - Tighten up the muscle on the front of the thigh (Quad) and hold for 5-10 sec. Repeat this 10-20 times hourly. Hamstring sets are done by pushing the foot backward against an object and holding for 5-10 sec. Repeat as with quad sets.  Leg Slides: Lying on your back, slowly slide your foot toward your buttocks, bending your knee up off the floor (only go as far as is comfortable). Then slowly slide your foot back down until your leg is flat on the floor again. Angel Wings: Lying on your  back spread your legs to the side as far apart as you can without causing discomfort.  A rehabilitation program following serious knee injuries can speed recovery and prevent re-injury in the future due to weakened muscles. Contact your doctor or a physical therapist for more information on knee rehabilitation.   IF YOU ARE TRANSFERRED TO A SKILLED REHAB FACILITY If the patient is transferred to a skilled rehab facility following release from the hospital, a list of the current medications will be sent to the facility  for the patient to continue.  When discharged from the skilled rehab facility, please have the facility set up the patient's [place] prior to being released. Also, the skilled facility will be responsible for providing the patient with their medications at time of release from the facility to include their pain medication, the muscle relaxants, and their blood thinner medication. If the patient is still at the rehab facility at time of the two week follow up appointment, the skilled rehab facility will also need to assist the patient in arranging follow up appointment in our office and any transportation needs.  MAKE SURE YOU:  Understand these instructions.  Get help right away if you are not doing well or get worse.    Pick up stool softner and laxative for home use following surgery while on pain medications. Do not submerge incision under water. Please use good hand washing techniques while changing dressing each day. May shower starting three days after surgery. Please use a clean towel to pat the incision dry following showers. Continue to use ice for pain and swelling after surgery. Do not use any lotions or creams on the incision until instructed by your surgeon.     Increase activity slowly as tolerated    Complete by:  As directed             Medication List    STOP taking these medications        aspirin EC 81 MG tablet     multivitamin  with minerals Tabs tablet      TAKE these medications        acetaminophen 500 MG tablet  Commonly known as:  TYLENOL  Take 500 mg by mouth every 6 (six) hours as needed (For pain.).     hydrochlorothiazide 25 MG tablet  Commonly known as:  HYDRODIURIL  Take 12.5 mg by mouth daily.     methocarbamol 500 MG tablet  Commonly known as:  ROBAXIN  Take 1 tablet (500 mg total) by mouth every 6 (six) hours as needed for muscle spasms.     oxyCODONE 5 MG immediate release tablet  Commonly known as:  Oxy IR/ROXICODONE  Take 1-2 tablets (5-10 mg total) by mouth every 3 (three) hours as needed for breakthrough pain.     rivaroxaban 10 MG Tabs tablet  Commonly known as:  XARELTO  Take 1 tablet (10 mg total) by mouth daily with breakfast.     simvastatin 40 MG tablet  Commonly known as:  ZOCOR  Take 20 mg by mouth daily.     traMADol 50 MG tablet  Commonly known as:  ULTRAM  Take 1-2 tablets (50-100 mg total) by mouth every 6 (six) hours as needed for moderate pain.           Follow-up Information    Follow up with Gearlean Alf, MD. Schedule an appointment as soon as possible for a visit on 11/24/2015.   Specialty:  Orthopedic Surgery   Why:  Call [phone] tomorrow to make the appointment   Contact information:   [address] [place] [address] [phone]       Follow up with [facility].   Why:  home health physical therapy   Contact information:   [place] [address] [phone]       Signed: Ardeen Jourdain, PA-C Orthopaedic Surgery 11/10/2015, 4:47 PM

## 2015-11-10 NOTE — Progress Notes (Signed)
Occupational Therapy Evaluation Patient Details Name: Jeffrey Andrews MRN: [id number] DOB: [birth year] Today's Date: 11/10/2015    History of Present Illness 70 yo male s/p R TKA 11/09/15. Hx of L TKA 05/2015   Clinical Impression   All OT education completed and pt questions answered. No further OT needs; will sign off.    Follow Up Recommendations  No OT follow up;Supervision/Assistance - 24 hour    Equipment Recommendations  None recommended by OT    Recommendations for Other Services       Precautions / Restrictions Precautions Precautions: Fall Required Braces or Orthoses: Knee Immobilizer - Right Knee Immobilizer - Right: Discontinue once straight leg raise with < 10 degree lag Restrictions Weight Bearing Restrictions: No RLE Weight Bearing: Weight bearing as tolerated      Mobility Bed Mobility               Transfers                 Balance                                            ADL Overall ADL's : Needs assistance/impaired Eating/Feeding: Independent;Sitting   Grooming: Set up;Sitting   Upper Body Bathing: Set up;Sitting   Lower Body Bathing: Minimal assistance;Sit to/from stand   Upper Body Dressing : Set up;Sitting   Lower Body Dressing: Minimal assistance;Sit to/from stand               Functional mobility during ADLs: Supervision/safety;Rolling walker General ADL Comments: Reviewed LB dressing techniques and tub transfer technique verbally with patient. He is familiar with ADL techniques from previous surgery.     Vision     Perception     Praxis      Pertinent Vitals/Pain Pain Assessment: 0-10 Pain Score: 3  Pain Location: R knee Pain Descriptors / Indicators: Aching;Sore Pain Intervention(s): Limited activity within patient's tolerance;Monitored during session     Hand Dominance Right   Extremity/Trunk Assessment Upper Extremity Assessment Upper Extremity Assessment: Overall WFL for  tasks assessed   Lower Extremity Assessment Lower Extremity Assessment: Defer to PT evaluation   Cervical / Trunk Assessment Cervical / Trunk Assessment: Normal   Communication Communication Communication: No difficulties   Cognition Arousal/Alertness: Awake/alert Behavior During Therapy: WFL for tasks assessed/performed Overall Cognitive Status: Within Functional Limits for tasks assessed                     General Comments       Exercises       Shoulder Instructions      Home Living Family/patient expects to be discharged to:: Private residence Living Arrangements: Spouse/significant other Available Help at Discharge: Family;Available 24 hours/day Type of Home: House Home Access: Stairs to enter [employer] of Steps: 3+3 (front) Entrance Stairs-Rails: Left Home Layout: One level     Bathroom Shower/Tub: Teacher, Kalaysia Demonbreun years/pre: Standard Bathroom Accessibility: Yes How Accessible: Accessible via walker Home Equipment: [place] - 2 wheels;Cane - single point   Additional Comments: can borrow BSC from church      Prior Functioning/Environment Level of Independence: Independent             OT Diagnosis: Acute pain   OT Problem List: Decreased strength;Decreased range of motion;Decreased knowledge of use of DME or AE;Pain   OT Treatment/Interventions:  OT Goals(Current goals can be found in the care plan section) Acute Rehab OT Goals Patient Stated Goal: home.  OT Goal Formulation: All assessment and education complete, DC therapy  OT Frequency:     Barriers to D/C:            Co-evaluation              End of Session    Activity Tolerance: Patient tolerated treatment well Patient left: in chair;with call bell/phone within reach   Time: 1035-1045 OT Time Calculation (min): 10 min Charges:  OT General Charges $OT Visit: 1 Procedure OT Evaluation $OT Eval Low Complexity: 1 Procedure G-Codes:    Tadan Shill,  Justa Hatchell A 12-04-2015, 10:51 AM

## 2019-09-05 ENCOUNTER — Encounter (INDEPENDENT_AMBULATORY_CARE_PROVIDER_SITE_OTHER): Payer: Self-pay | Admitting: Ophthalmology

## 2019-09-05 ENCOUNTER — Other Ambulatory Visit: Payer: Self-pay

## 2019-09-05 ENCOUNTER — Ambulatory Visit (INDEPENDENT_AMBULATORY_CARE_PROVIDER_SITE_OTHER): Payer: Medicare Other | Admitting: Ophthalmology

## 2019-09-05 DIAGNOSIS — H35071 Retinal telangiectasis, right eye: Secondary | ICD-10-CM | POA: Diagnosis not present

## 2019-09-05 DIAGNOSIS — H353111 Nonexudative age-related macular degeneration, right eye, early dry stage: Secondary | ICD-10-CM | POA: Insufficient documentation

## 2019-09-05 DIAGNOSIS — H35362 Drusen (degenerative) of macula, left eye: Secondary | ICD-10-CM

## 2019-09-05 DIAGNOSIS — H35072 Retinal telangiectasis, left eye: Secondary | ICD-10-CM | POA: Diagnosis not present

## 2019-09-05 DIAGNOSIS — H35361 Drusen (degenerative) of macula, right eye: Secondary | ICD-10-CM

## 2019-09-05 DIAGNOSIS — Z961 Presence of intraocular lens: Secondary | ICD-10-CM

## 2019-09-05 DIAGNOSIS — G4730 Sleep apnea, unspecified: Secondary | ICD-10-CM

## 2019-09-05 DIAGNOSIS — H353122 Nonexudative age-related macular degeneration, left eye, intermediate dry stage: Secondary | ICD-10-CM | POA: Diagnosis not present

## 2019-09-05 NOTE — Progress Notes (Signed)
09/05/2019     CHIEF COMPLAINT Patient presents for Retina Follow Up   HISTORY OF PRESENT ILLNESS: Jeffrey Andrews is a 74 y.o. male who presents to the clinic today for:   HPI    Retina Follow Up    Patient presents with  Dry AMD.  In left eye.  This started 3 weeks ago.  Severity is mild.  Duration of 3 weeks.  Since onset it is stable.          Comments    3 Week F/U OS - OCT    Pt denies noticeable changes to [place] OU since last visit. Pt denies ocular pain, flashes of light, or floaters OU.         Last edited by Hurman Horn, MD on 09/05/2019  9:29 AM. (History)      Referring physician: Shirline Frees, MD [address] [place],  [place] [address]  HISTORICAL INFORMATION:   Selected notes from the MEDICAL RECORD NUMBER       CURRENT MEDICATIONS: No current outpatient medications on file. (Ophthalmic Drugs)   No current facility-administered medications for this visit. (Ophthalmic Drugs)   Current Outpatient Medications (Other)  Medication Sig  . acetaminophen (TYLENOL) 500 MG tablet Take 500 mg by mouth every 6 (six) hours as needed (For pain.).  Marland Kitchen hydrochlorothiazide (HYDRODIURIL) 25 MG tablet Take 12.5 mg by mouth daily.   Marland Kitchen ipratropium (ATROVENT) 0.03 % nasal spray Place into the nose.  . loratadine (CLARITIN) 10 MG tablet Take by mouth.  . metFORMIN (GLUCOPHAGE) 500 MG tablet Take by mouth.  . methocarbamol (ROBAXIN) 500 MG tablet Take 1 tablet (500 mg total) by mouth every 6 (six) hours as needed for muscle spasms.  . Multiple Vitamin (MULTI-VITAMIN) tablet Take by mouth.  . oxyCODONE (OXY IR/ROXICODONE) 5 MG immediate release tablet Take 1-2 tablets (5-10 mg total) by mouth every 3 (three) hours as needed for breakthrough pain.  . rivaroxaban (XARELTO) 10 MG TABS tablet Take 1 tablet (10 mg total) by mouth daily with breakfast.  . simvastatin (ZOCOR) 40 MG tablet Take 20 mg by mouth daily.   . traMADol (ULTRAM) 50 MG tablet Take 1-2 tablets  (50-100 mg total) by mouth every 6 (six) hours as needed for moderate pain.   No current facility-administered medications for this visit. (Other)      REVIEW OF SYSTEMS:    ALLERGIES No Known Allergies  PAST MEDICAL HISTORY Past Medical History:  Diagnosis Date  . Arthritis   . Cancer ([place]) 2014   skin cancer left arm  . Excessive sweating   . Heart murmur    "functional heart murmur"  . History of kidney stones   . History of skin cancer   . Hypertension   . Sleep apnea    uses c-pap   Past Surgical History:  Procedure Laterality Date  . BACK SURGERY     x3 last back surgery 1984  . EYE SURGERY     cataracts with lens implants  . STERIOD INJECTION Right 06/08/2015   Procedure: STEROID INJECTION;  Surgeon: Gaynelle Arabian, MD;  Location: [facility];  Service: Orthopedics;  Laterality: Right;  . TENDON REPAIR Left 2000   knee  . TONSILLECTOMY    . TOTAL KNEE ARTHROPLASTY Left 06/08/2015   Procedure: TOTAL KNEE ARTHROPLASTY;  Surgeon: Gaynelle Arabian, MD;  Location: [facility];  Service: Orthopedics;  Laterality: Left;  . TOTAL KNEE ARTHROPLASTY Right 11/09/2015   Procedure: RIGHT TOTAL  KNEE ARTHROPLASTY;  Surgeon: Gaynelle Arabian, MD;  Location: [facility];  Service: Orthopedics;  Laterality: Right;  . WRIST SURGERY  2008   left    FAMILY HISTORY History reviewed. No pertinent family history.  SOCIAL HISTORY Social History   Tobacco Use  . Smoking status: Never Smoker  . Smokeless tobacco: Former Network engineer Use Topics  . Alcohol use: No  . Drug use: No         OPHTHALMIC EXAM:  Base Eye Exam    Visual Acuity (Snellen - Linear)      Right Left   Dist [place] 20/20 -1 20/70   Dist ph [place]  20/60 -2       Tonometry (Tonopen, 8:27 AM)      Right Left   Pressure 13 13       Pupils      Pupils Dark Light Shape React APD   Right PERRL 4 3 Round Brisk None   Left PERRL 4 3 Round Brisk None       Visual Fields (Counting fingers)      Left Right    Full Full        Extraocular Movement      Right Left    Full Full       Neuro/Psych    Oriented x3: Yes   Mood/Affect: Normal       Dilation    Left eye: 1.0% Mydriacyl, 2.5% Phenylephrine @ 8:27 AM        Slit Lamp and Fundus Exam    External Exam      Right Left   External Normal Normal       Slit Lamp Exam      Right Left   Lids/Lashes Normal Normal   Conjunctiva/Sclera White and quiet White and quiet   Cornea Clear Clear   Anterior Chamber Deep and quiet Deep and quiet   Iris Round and reactive Round and reactive   Lens Posterior chamber intraocular lens Posterior chamber intraocular lens   Vitreous Normal Normal       Fundus Exam      Right Left   Disc Normal Normal   C/D Ratio  0.4   Macula Normal Microaneurysms   Vessels Normal Normal   Periphery Normal Normal          IMAGING AND PROCEDURES  Imaging and Procedures for _0 @  OCT, Retina - OU - Both Eyes       Right Eye Quality was good. Scan locations included subfoveal. Findings include no SRF, no IRF, retinal drusen .   Left Eye Quality was good. Scan locations included subfoveal.   Notes OD, dry age-related macular degeneration, no CN VM  OS dry age-related macular degeneration with much smaller subfoveal drusen annoyed material now 49month after vitrectomy and membrane peel.  There is no increase in CME.  Microaneurysm noted on last visit 378-month weeks ago with fluorescein angiography is likely old residual MAC tell related to the patient's ongoing need for CPAP for sleep apnea.                ASSESSMENT/PLAN:    ICD-10-CM   1. 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 Retinal telangiectasia of right eye  H35.071   3. 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 Intermediate stage nonexudative age-related macular degeneration of left eye  H35.3122   5. Pseudophakia  Z96.1   6. Degenerative retinal drusen of  right eye  H35.361   7. Degenerative retinal drusen of left eye  H35.362   8. Sleep apnea, unspecified type  G47.30     1.  2.  3.  Ophthalmic Meds Ordered this visit:  No orders of the defined types were placed in this encounter.      Return in about 6 months (around 03/06/2020) for DILATE OU, OCT.  There are no Patient Instructions on file for this visit.   Explained the diagnoses, plan, and follow up with the patient and they expressed understanding.  Patient expressed understanding of the importance of proper follow up care.   Clent Demark Pieper Kasik M.D. Diseases & Surgery of the Retina and Vitreous Retina & Diabetic [place] _0 @     Abbreviations: M myopia (nearsighted); A astigmatism; H hyperopia (farsighted); P presbyopia; Mrx spectacle prescription;  CTL contact lenses; OD right eye; OS left eye; OU both eyes  XT exotropia; ET esotropia; PEK punctate epithelial keratitis; PEE punctate epithelial erosions; DES dry eye syndrome; MGD meibomian gland dysfunction; ATs artificial tears; PFAT's preservative free artificial tears; [place] nuclear sclerotic cataract; PSC posterior subcapsular cataract; ERM epi-retinal membrane; PVD posterior vitreous detachment; RD retinal detachment; DM diabetes mellitus; DR diabetic retinopathy; NPDR non-proliferative diabetic retinopathy; PDR proliferative diabetic retinopathy; CSME clinically significant macular edema; DME diabetic macular edema; dbh dot blot hemorrhages; CWS cotton wool spot; POAG primary open angle glaucoma; C/D cup-to-disc ratio; HVF humphrey visual field; GVF goldmann visual field; OCT optical coherence tomography; IOP intraocular pressure; BRVO Branch retinal vein occlusion; CRVO central retinal vein occlusion; CRAO central retinal artery occlusion; BRAO branch retinal artery occlusion; RT retinal tear; SB scleral buckle; PPV pars plana vitrectomy; VH Vitreous hemorrhage; PRP panretinal laser photocoagulation; IVK intravitreal  kenalog; VMT vitreomacular traction; MH Macular hole;  NVD neovascularization of the disc; NVE neovascularization elsewhere; AREDS age related eye disease study; ARMD age related macular degeneration; POAG primary open angle glaucoma; EBMD epithelial/anterior basement membrane dystrophy; ACIOL anterior chamber intraocular lens; IOL intraocular lens; PCIOL posterior chamber intraocular lens; Phaco/IOL phacoemulsification with intraocular lens placement; [place] photorefractive keratectomy; LASIK laser assisted in situ keratomileusis; HTN hypertension; DM diabetes mellitus; COPD chronic obstructive pulmonary disease

## 2020-03-09 ENCOUNTER — Encounter (INDEPENDENT_AMBULATORY_CARE_PROVIDER_SITE_OTHER): Payer: Medicare Other | Admitting: Ophthalmology

## 2020-03-16 ENCOUNTER — Encounter (INDEPENDENT_AMBULATORY_CARE_PROVIDER_SITE_OTHER): Payer: Self-pay | Admitting: Ophthalmology

## 2020-03-16 ENCOUNTER — Ambulatory Visit (INDEPENDENT_AMBULATORY_CARE_PROVIDER_SITE_OTHER): Payer: Medicare Other | Admitting: Ophthalmology

## 2020-03-16 ENCOUNTER — Other Ambulatory Visit: Payer: Self-pay

## 2020-03-16 DIAGNOSIS — Z9889 Other specified postprocedural states: Secondary | ICD-10-CM | POA: Diagnosis not present

## 2020-03-16 DIAGNOSIS — H35072 Retinal telangiectasis, left eye: Secondary | ICD-10-CM

## 2020-03-16 DIAGNOSIS — H353111 Nonexudative age-related macular degeneration, right eye, early dry stage: Secondary | ICD-10-CM

## 2020-03-16 DIAGNOSIS — H35071 Retinal telangiectasis, right eye: Secondary | ICD-10-CM

## 2020-03-16 NOTE — Patient Instructions (Signed)
Patient to notify the office promptly of new onset visual acuity decline or distortions.

## 2020-03-16 NOTE — Progress Notes (Signed)
03/16/2020     CHIEF COMPLAINT Patient presents for Retina Follow Up   HISTORY OF PRESENT ILLNESS: Jeffrey Andrews is a 74 y.o. male who presents to the clinic today for:   HPI    Retina Follow Up    Patient presents with  Other.  In left eye.  This started 6 months ago.  Severity is mild.  Duration of 6 months.  Since onset it is stable.          Comments    6 Month F/U OU  Pt reports occasional "random" floaters OS. Pt denies changes to [place]. Pt denies floaters OD.       Last edited by Rockie Neighbours, [place] on 03/16/2020  8:08 AM. (History)      Referring physician: Shirline Frees, MD [address] [place],  [place] [address]  HISTORICAL INFORMATION:   Selected notes from the [place]: No current outpatient medications on file. (Ophthalmic Drugs)   No current facility-administered medications for this visit. (Ophthalmic Drugs)   Current Outpatient Medications (Other)  Medication Sig  . acetaminophen (TYLENOL) 500 MG tablet Take 500 mg by mouth every 6 (six) hours as needed (For pain.).  Marland Kitchen hydrochlorothiazide (HYDRODIURIL) 25 MG tablet Take 12.5 mg by mouth daily.   Marland Kitchen ipratropium (ATROVENT) 0.03 % nasal spray Place into the nose.  . loratadine (CLARITIN) 10 MG tablet Take by mouth.  . metFORMIN (GLUCOPHAGE) 500 MG tablet Take by mouth.  . methocarbamol (ROBAXIN) 500 MG tablet Take 1 tablet (500 mg total) by mouth every 6 (six) hours as needed for muscle spasms.  . Multiple Vitamin (MULTI-VITAMIN) tablet Take by mouth.  . oxyCODONE (OXY IR/ROXICODONE) 5 MG immediate release tablet Take 1-2 tablets (5-10 mg total) by mouth every 3 (three) hours as needed for breakthrough pain.  . rivaroxaban (XARELTO) 10 MG TABS tablet Take 1 tablet (10 mg total) by mouth daily with breakfast.  . simvastatin (ZOCOR) 40 MG tablet Take 20 mg by mouth daily.   . traMADol (ULTRAM) 50 MG tablet Take 1-2 tablets (50-100 mg total) by  mouth every 6 (six) hours as needed for moderate pain.   No current facility-administered medications for this visit. (Other)      REVIEW OF SYSTEMS:    ALLERGIES No Known Allergies  PAST MEDICAL HISTORY Past Medical History:  Diagnosis Date  . Arthritis   . Cancer ([place]) 2014   skin cancer left arm  . Excessive sweating   . Heart murmur    "functional heart murmur"  . History of kidney stones   . History of skin cancer   . Hypertension   . Sleep apnea    uses c-pap   Past Surgical History:  Procedure Laterality Date  . BACK SURGERY     x3 last back surgery 1984  . EYE SURGERY     cataracts with lens implants  . STERIOD INJECTION Right 06/08/2015   Procedure: STEROID INJECTION;  Surgeon: Gaynelle Arabian, MD;  Location: [facility];  Service: Orthopedics;  Laterality: Right;  . TENDON REPAIR Left 2000   knee  . TONSILLECTOMY    . TOTAL KNEE ARTHROPLASTY Left 06/08/2015   Procedure: TOTAL KNEE ARTHROPLASTY;  Surgeon: Gaynelle Arabian, MD;  Location: [facility];  Service: Orthopedics;  Laterality: Left;  . TOTAL KNEE ARTHROPLASTY Right 11/09/2015   Procedure: RIGHT TOTAL KNEE ARTHROPLASTY;  Surgeon: Gaynelle Arabian, MD;  Location[facility];  Service: Orthopedics;  Laterality: Right;  . WRIST SURGERY  2008   left    FAMILY HISTORY History reviewed. No pertinent family history.  SOCIAL HISTORY Social History   Tobacco Use  . Smoking status: Never Smoker  . Smokeless tobacco: Former Network engineer Use Topics  . Alcohol use: No  . Drug use: No         OPHTHALMIC EXAM:  Base Eye Exam    Visual Acuity (ETDRS)      Right Left   Dist [place] 20/30 -1 20/60 -1   Dist ph [place] 20/50       Tonometry (Tonopen, 8:08 AM)      Right Left   Pressure 21 17       Pupils      Pupils Dark Light Shape React APD   Right PERRL 4 3 Round Brisk None   Left PERRL 4 3 Round Brisk None       Visual Fields (Counting fingers)      Left Right    Full Full       Extraocular Movement       Right Left    Full Full       Neuro/Psych    Oriented x3: Yes   Mood/Affect: Normal       Dilation    Both eyes: 1.0% Mydriacyl, 2.5% Phenylephrine @ 8:11 AM        Slit Lamp and Fundus Exam    External Exam      Right Left   External Normal Normal       Slit Lamp Exam      Right Left   Lids/Lashes Normal Normal   Conjunctiva/Sclera White and quiet White and quiet   Cornea Clear Clear   Anterior Chamber Deep and quiet Deep and quiet   Iris Round and reactive Round and reactive   Lens Posterior chamber intraocular lens Posterior chamber intraocular lens   Anterior Vitreous Normal Normal       Fundus Exam      Right Left   Posterior Vitreous Posterior vitreous detachment Clear, vitrectomized   Disc Normal Normal   C/D Ratio 0.5 0.55   Macula Hard drusen, no hemorrhage, no macular thickening Microaneurysms   Vessels Normal Normal   Periphery Normal,, no retinal tear Normal,, no retinal tear          IMAGING AND PROCEDURES  Imaging and Procedures for 03/16/20  OCT, Retina - OU - Both Eyes       Right Eye Quality was good. Scan locations included subfoveal. Progression has been stable. Findings include no SRF, no IRF.   Left Eye Quality was good. Progression has been stable. Findings include no SRF, no IRF, retinal drusen .   Notes No active maculopathy OU, epiretinal membrane resolved OS status post surgical intervention.                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  History of vitrectomy Vitrectomy, membrane peel left eye, Oct 28, 2018.  Macular anatomy continues to improve  Retinal telangiectasia of right eye Patient does have sleep apnea and continues using CPAP, which will maintain and prevent this condition from worsening  Retinal telangiectasia of left eye Same as right eye.      ICD-10-CM   1. 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 Early stage nonexudative age-related macular  degeneration of right eye  H35.3111   3. History of vitrectomy  Z98.890   4. Retinal telangiectasia of right eye  H35.071     1.  History of vitrectomy left eye, macular pucker resolved, subfoveal drusenoid deposit also has resolved since surgical resection of epiretinal membrane.  2.  No active progression of age-related maculopathy OU.  3.  Retinal telangiectasia each eye, last fluorescein angiography performed March 2021, no active lesions OU.  Ophthalmic Meds Ordered this visit:  No orders of the defined types were placed in this encounter.      Return in about 1 year (around 03/16/2021) for DILATE OU, OCT.  Patient Instructions  Patient to notify the office promptly of new onset visual acuity decline or distortions.    Explained the diagnoses, plan, and follow up with the patient and they expressed understanding.  Patient expressed understanding of the importance of proper follow up care.   Clent Demark Khyree Carillo M.D. Diseases & Surgery of the Retina and Vitreous Retina & Diabetic [place] 03/16/20     Abbreviations: M myopia (nearsighted); A astigmatism; H hyperopia (farsighted); P presbyopia; Mrx spectacle prescription;  CTL contact lenses; OD right eye; OS left eye; OU both eyes  XT exotropia; ET esotropia; PEK punctate epithelial keratitis; PEE punctate epithelial erosions; DES dry eye syndrome; MGD meibomian gland dysfunction; ATs artificial tears; PFAT's preservative free artificial tears; [place] nuclear sclerotic cataract; PSC posterior subcapsular cataract; ERM epi-retinal membrane; PVD posterior vitreous detachment; RD retinal detachment; DM diabetes mellitus; DR diabetic retinopathy; NPDR non-proliferative diabetic retinopathy; PDR proliferative diabetic retinopathy; CSME clinically significant macular edema; DME diabetic macular edema; dbh dot blot hemorrhages; CWS cotton wool spot; POAG primary open  angle glaucoma; C/D cup-to-disc ratio; HVF humphrey visual field; GVF goldmann visual field; OCT optical coherence tomography; IOP intraocular pressure; BRVO Branch retinal vein occlusion; CRVO central retinal vein occlusion; CRAO central retinal artery occlusion; BRAO branch retinal artery occlusion; RT retinal tear; SB scleral buckle; PPV pars plana vitrectomy; VH Vitreous hemorrhage; PRP panretinal laser photocoagulation; IVK intravitreal kenalog; VMT vitreomacular traction; MH Macular hole;  NVD neovascularization of the disc; NVE neovascularization elsewhere; AREDS age related eye disease study; ARMD age related macular degeneration; POAG primary open angle glaucoma; EBMD epithelial/anterior basement membrane dystrophy; ACIOL anterior chamber intraocular lens; IOL intraocular lens; PCIOL posterior chamber intraocular lens; Phaco/IOL phacoemulsification with intraocular lens placement; [place] photorefractive keratectomy; LASIK laser assisted in situ keratomileusis; HTN hypertension; DM diabetes mellitus; COPD chronic obstructive pulmonary disease

## 2020-03-16 NOTE — Assessment & Plan Note (Signed)
Vitrectomy, membrane peel left eye, Oct 28, 2018.  Macular anatomy continues to improve

## 2020-03-16 NOTE — Assessment & Plan Note (Signed)
Stable, no active disease.

## 2020-03-16 NOTE — Assessment & Plan Note (Signed)
Same as right eye.

## 2020-03-16 NOTE — Assessment & Plan Note (Signed)
Patient does have sleep apnea and continues using CPAP, which will maintain and prevent this condition from worsening

## 2020-10-27 ENCOUNTER — Encounter (INDEPENDENT_AMBULATORY_CARE_PROVIDER_SITE_OTHER): Payer: Self-pay

## 2020-10-27 DIAGNOSIS — H353132 Nonexudative age-related macular degeneration, bilateral, intermediate dry stage: Secondary | ICD-10-CM | POA: Diagnosis not present

## 2020-10-27 DIAGNOSIS — Z961 Presence of intraocular lens: Secondary | ICD-10-CM | POA: Diagnosis not present

## 2020-10-27 DIAGNOSIS — Z9889 Other specified postprocedural states: Secondary | ICD-10-CM | POA: Diagnosis not present

## 2020-10-27 DIAGNOSIS — E119 Type 2 diabetes mellitus without complications: Secondary | ICD-10-CM | POA: Diagnosis not present

## 2020-10-27 DIAGNOSIS — H43811 Vitreous degeneration, right eye: Secondary | ICD-10-CM | POA: Diagnosis not present

## 2021-03-16 ENCOUNTER — Encounter (INDEPENDENT_AMBULATORY_CARE_PROVIDER_SITE_OTHER): Payer: Self-pay | Admitting: Ophthalmology

## 2021-03-16 ENCOUNTER — Ambulatory Visit (INDEPENDENT_AMBULATORY_CARE_PROVIDER_SITE_OTHER): Payer: Medicare Other | Admitting: Ophthalmology

## 2021-03-16 ENCOUNTER — Other Ambulatory Visit: Payer: Self-pay

## 2021-03-16 DIAGNOSIS — H35071 Retinal telangiectasis, right eye: Secondary | ICD-10-CM

## 2021-03-16 DIAGNOSIS — G4733 Obstructive sleep apnea (adult) (pediatric): Secondary | ICD-10-CM

## 2021-03-16 DIAGNOSIS — H353122 Nonexudative age-related macular degeneration, left eye, intermediate dry stage: Secondary | ICD-10-CM | POA: Diagnosis not present

## 2021-03-16 DIAGNOSIS — H35072 Retinal telangiectasis, left eye: Secondary | ICD-10-CM

## 2021-03-16 DIAGNOSIS — Z9889 Other specified postprocedural states: Secondary | ICD-10-CM

## 2021-03-16 NOTE — Assessment & Plan Note (Signed)
Continues with excellent compliance with CPAP

## 2021-03-16 NOTE — Assessment & Plan Note (Signed)
See comments regarding the right eye, same

## 2021-03-16 NOTE — Assessment & Plan Note (Signed)
Large subfoveal drusenoid deposit improved post vitrectomy membrane peel OS

## 2021-03-16 NOTE — Progress Notes (Signed)
03/16/2021     CHIEF COMPLAINT Patient presents for  Chief Complaint  Patient presents with   Retina Follow Up      HISTORY OF PRESENT ILLNESS: Jeffrey Andrews is a 75 y.o. male who presents to the clinic today for:   HPI     Retina Follow Up   Patient presents with  Other.  In left eye.  This started 1 year ago.  Severity is mild.  Duration of 1 year.  Since onset it is stable.        Comments   1 yr fu ou oct. Patient states vision is stable and unchanged since last visit. Denies any new floaters or FOL, floaters are longstanding and stable.      Last edited by Laurin Coder on 03/16/2021  8:06 AM.      Referring physician: Shirline Frees, MD [place],  [place] [id number]  HISTORICAL INFORMATION:   Selected notes from the MEDICAL RECORD NUMBER       CURRENT MEDICATIONS: No current outpatient medications on file. (Ophthalmic Drugs)   No current facility-administered medications for this visit. (Ophthalmic Drugs)   Current Outpatient Medications (Other)  Medication Sig   acetaminophen (TYLENOL) 500 MG tablet Take 500 mg by mouth every 6 (six) hours as needed (For pain.).   hydrochlorothiazide (HYDRODIURIL) 25 MG tablet Take 12.5 mg by mouth daily.    ipratropium (ATROVENT) 0.03 % nasal spray Place into the nose.   loratadine (CLARITIN) 10 MG tablet Take by mouth.   metFORMIN (GLUCOPHAGE) 500 MG tablet Take by mouth.   methocarbamol (ROBAXIN) 500 MG tablet Take 1 tablet (500 mg total) by mouth every 6 (six) hours as needed for muscle spasms.   Multiple Vitamin (MULTI-VITAMIN) tablet Take by mouth.   oxyCODONE (OXY IR/ROXICODONE) 5 MG immediate release tablet Take 1-2 tablets (5-10 mg total) by mouth every 3 (three) hours as needed for breakthrough pain.   rivaroxaban (XARELTO) 10 MG TABS tablet Take 1 tablet (10 mg total) by mouth daily with breakfast.   simvastatin (ZOCOR) 40 MG tablet Take 20 mg by mouth daily.    traMADol  (ULTRAM) 50 MG tablet Take 1-2 tablets (50-100 mg total) by mouth every 6 (six) hours as needed for moderate pain.   No current facility-administered medications for this visit. (Other)      REVIEW OF SYSTEMS:    ALLERGIES No Known Allergies  PAST MEDICAL HISTORY Past Medical History:  Diagnosis Date   Arthritis    Cancer ([place]) 2014   skin cancer left arm   Excessive sweating    Heart murmur    "functional heart murmur"   History of kidney stones    History of skin cancer    Hypertension    Sleep apnea    uses c-pap   Past Surgical History:  Procedure Laterality Date   BACK SURGERY     x3 last back surgery 1984   EYE SURGERY     cataracts with lens implants   STERIOD INJECTION Right 06/08/2015   Procedure: STEROID INJECTION;  Surgeon: Gaynelle Arabian, MD;  Location: [facility];  Service: Orthopedics;  Laterality: Right;   TENDON REPAIR Left 2000   knee   TONSILLECTOMY     TOTAL KNEE ARTHROPLASTY Left 06/08/2015   Procedure: TOTAL KNEE ARTHROPLASTY;  Surgeon: Gaynelle Arabian, MD;  Location: [facility];  Service: Orthopedics;  Laterality: Left;   TOTAL KNEE ARTHROPLASTY Right 11/09/2015   Procedure: RIGHT TOTAL  KNEE ARTHROPLASTY;  Surgeon: Gaynelle Arabian, MD;  Location: [facility];  Service: Orthopedics;  Laterality: Right;   WRIST SURGERY  2008   left    FAMILY HISTORY History reviewed. No pertinent family history.  SOCIAL HISTORY Social History   Tobacco Use   Smoking status: Never   Smokeless tobacco: Former    Quit date: 10/29/1992  Substance Use Topics   Alcohol use: No   Drug use: No         OPHTHALMIC EXAM:  Base Eye Exam     Visual Acuity (ETDRS)       Right Left   Dist [place] 20/40 20/70   Dist ph [place] 20/30 -2 20/50 -1         Tonometry (Tonopen, 8:09 AM)       Right Left   Pressure 16 20         Pupils       Pupils Dark Light Shape React APD   Right PERRL 4 3 Round Brisk None   Left PERRL 4 3 Round Brisk None         Visual Fields (Counting  fingers)       Left Right    Full Full         Extraocular Movement       Right Left    Full Full         Neuro/Psych     Oriented x3: Yes   Mood/Affect: Normal         Dilation     Both eyes: 1.0% Mydriacyl, 2.5% Phenylephrine @ 8:09 AM           Slit Lamp and Fundus Exam     External Exam       Right Left   External Normal Normal         Slit Lamp Exam       Right Left   Lids/Lashes Normal Normal   Conjunctiva/Sclera White and quiet White and quiet   Cornea Clear Clear   Anterior Chamber Deep and quiet Deep and quiet   Iris Round and reactive Round and reactive   Lens Posterior chamber intraocular lens Posterior chamber intraocular lens   Anterior Vitreous Normal Normal         Fundus Exam       Right Left   Posterior Vitreous Posterior vitreous detachment Clear, vitrectomized   Disc Normal Normal   C/D Ratio 0.5 0.55   Macula Hard drusen, no hemorrhage, no macular thickening Microaneurysms   Vessels Normal Normal   Periphery Normal,, no retinal tear Normal,, no retinal tear            IMAGING AND PROCEDURES  Imaging and Procedures for 03/16/21  OCT, Retina - OU - Both Eyes       Right Eye Quality was good. Scan locations included subfoveal. Central Foveal Thickness: 278. Progression has been stable. Findings include no SRF, no IRF.   Left Eye Quality was good. Central Foveal Thickness: 368. Progression has been stable. Findings include no SRF, no IRF, retinal drusen .   Notes No active maculopathy OU, epiretinal membrane resolved OS status post surgical intervention.             ASSESSMENT/PLAN:  Sleep apnea syndrome Continues with excellent compliance with CPAP  Retinal telangiectasia of right eye No active progression, patient continues on CPAP use for sleep apnea  Retinal telangiectasia of left eye See comments regarding the right eye, same  Intermediate stage  nonexudative age-related macular degeneration  of left eye Moderate, no sign of CNVM  History of vitrectomy Large subfoveal drusenoid deposit improved post vitrectomy membrane peel OS     ICD-10-CM   1. Retinal telangiectasia of left eye  H35.072 OCT, Retina - OU - Both Eyes    2. Obstructive sleep apnea syndrome  G47.33     3. Retinal telangiectasia of right eye  H35.071     4. Intermediate stage nonexudative age-related macular degeneration of left eye  H35.3122     5. History of vitrectomy  Z98.890       1.  OU stable overall.  No signs of reaccumulation of intraretinal fluid or CME from [place].  We will continue to observe  2.  Intermediate ARMD, no sign of CNVM  3.  Ophthalmic Meds Ordered this visit:  No orders of the defined types were placed in this encounter.      Return in about 1 year (around 03/16/2022) for DILATE OU, COLOR FP, OCT.  There are no Patient Instructions on file for this visit.   Explained the diagnoses, plan, and follow up with the patient and they expressed understanding.  Patient expressed understanding of the importance of proper follow up care.   Clent Demark Shmuel Girgis M.D. Diseases & Surgery of the Retina and Vitreous [facility]ina [facility] Diabetic [place] 03/16/21     Abbreviations: M myopia (nearsighted); A astigmatism; H hyperopia (farsighted); P presbyopia; Mrx spectacle prescription;  CTL contact lenses; OD right eye; OS left eye; OU both eyes  XT exotropia; ET esotropia; PEK punctate epithelial keratitis; PEE punctate epithelial erosions; DES dry eye syndrome; MGD meibomian gland dysfunction; ATs artificial tears; PFAT's preservative free artificial tears; [place] nuclear sclerotic cataract; PSC posterior subcapsular cataract; ERM epi-retinal membrane; PVD posterior vitreous detachment; RD retinal detachment; DM diabetes mellitus; DR diabetic retinopathy; NPDR non-proliferative diabetic retinopathy; PDR proliferative diabetic retinopathy; CSME clinically significant macular edema; DME diabetic  macular edema; dbh dot blot hemorrhages; CWS cotton wool spot; POAG primary open angle glaucoma; C/D cup-to-disc ratio; HVF humphrey visual field; GVF goldmann visual field; OCT optical coherence tomography; IOP intraocular pressure; BRVO Branch retinal vein occlusion; CRVO central retinal vein occlusion; CRAO central retinal artery occlusion; BRAO branch retinal artery occlusion; RT retinal tear; SB scleral buckle; PPV pars plana vitrectomy; VH Vitreous hemorrhage; PRP panretinal laser photocoagulation; IVK intravitreal kenalog; VMT vitreomacular traction; MH Macular hole;  NVD neovascularization of the disc; NVE neovascularization elsewhere; AREDS age related eye disease study; ARMD age related macular degeneration; POAG primary open angle glaucoma; EBMD epithelial/anterior basement membrane dystrophy; ACIOL anterior chamber intraocular lens; IOL intraocular lens; PCIOL posterior chamber intraocular lens; Phaco/IOL phacoemulsification with intraocular lens placement; [place] photorefractive keratectomy; LASIK laser assisted in situ keratomileusis; HTN hypertension; DM diabetes mellitus; COPD chronic obstructive pulmonary disease

## 2021-03-16 NOTE — Assessment & Plan Note (Signed)
Moderate, no sign of CNVM

## 2021-03-16 NOTE — Assessment & Plan Note (Signed)
No active progression, patient continues on CPAP use for sleep apnea

## 2021-06-27 ENCOUNTER — Encounter (HOSPITAL_COMMUNITY): Payer: Self-pay

## 2021-06-27 ENCOUNTER — Inpatient Hospital Stay (HOSPITAL_COMMUNITY)
Admission: EM | Admit: 2021-06-27 | Discharge: 2021-07-04 | DRG: 308 | Disposition: A | Discharge status: Home / Self Care | Payer: Medicare Other | Attending: Family Medicine | Admitting: Family Medicine

## 2021-06-27 ENCOUNTER — Other Ambulatory Visit: Payer: Self-pay

## 2021-06-27 ENCOUNTER — Emergency Department (HOSPITAL_COMMUNITY): Payer: Medicare Other

## 2021-06-27 DIAGNOSIS — E785 Hyperlipidemia, unspecified: Secondary | ICD-10-CM | POA: Diagnosis present

## 2021-06-27 DIAGNOSIS — J180 Bronchopneumonia, unspecified organism: Secondary | ICD-10-CM | POA: Diagnosis present

## 2021-06-27 DIAGNOSIS — J40 Bronchitis, not specified as acute or chronic: Secondary | ICD-10-CM | POA: Diagnosis present

## 2021-06-27 DIAGNOSIS — Z96653 Presence of artificial knee joint, bilateral: Secondary | ICD-10-CM | POA: Diagnosis not present

## 2021-06-27 DIAGNOSIS — I700 Atherosclerosis of aorta: Secondary | ICD-10-CM | POA: Diagnosis not present

## 2021-06-27 DIAGNOSIS — I5041 Acute combined systolic (congestive) and diastolic (congestive) heart failure: Secondary | ICD-10-CM

## 2021-06-27 DIAGNOSIS — Z87891 Personal history of nicotine dependence: Secondary | ICD-10-CM

## 2021-06-27 DIAGNOSIS — Z20822 Contact with and (suspected) exposure to covid-19: Secondary | ICD-10-CM | POA: Diagnosis not present

## 2021-06-27 DIAGNOSIS — I10 Essential (primary) hypertension: Secondary | ICD-10-CM | POA: Diagnosis not present

## 2021-06-27 DIAGNOSIS — R0982 Postnasal drip: Secondary | ICD-10-CM | POA: Diagnosis present

## 2021-06-27 DIAGNOSIS — Z9849 Cataract extraction status, unspecified eye: Secondary | ICD-10-CM

## 2021-06-27 DIAGNOSIS — G4733 Obstructive sleep apnea (adult) (pediatric): Secondary | ICD-10-CM | POA: Diagnosis present

## 2021-06-27 DIAGNOSIS — Z79899 Other long term (current) drug therapy: Secondary | ICD-10-CM | POA: Diagnosis not present

## 2021-06-27 DIAGNOSIS — R918 Other nonspecific abnormal finding of lung field: Secondary | ICD-10-CM | POA: Diagnosis not present

## 2021-06-27 DIAGNOSIS — I4891 Unspecified atrial fibrillation: Secondary | ICD-10-CM | POA: Diagnosis not present

## 2021-06-27 DIAGNOSIS — Z85828 Personal history of other malignant neoplasm of skin: Secondary | ICD-10-CM

## 2021-06-27 DIAGNOSIS — E7439 Other disorders of intestinal carbohydrate absorption: Secondary | ICD-10-CM | POA: Diagnosis present

## 2021-06-27 DIAGNOSIS — E669 Obesity, unspecified: Secondary | ICD-10-CM | POA: Diagnosis present

## 2021-06-27 DIAGNOSIS — E1165 Type 2 diabetes mellitus with hyperglycemia: Secondary | ICD-10-CM | POA: Diagnosis present

## 2021-06-27 DIAGNOSIS — G4730 Sleep apnea, unspecified: Secondary | ICD-10-CM | POA: Diagnosis not present

## 2021-06-27 DIAGNOSIS — Z833 Family history of diabetes mellitus: Secondary | ICD-10-CM

## 2021-06-27 DIAGNOSIS — I4819 Other persistent atrial fibrillation: Secondary | ICD-10-CM | POA: Diagnosis not present

## 2021-06-27 DIAGNOSIS — I493 Ventricular premature depolarization: Secondary | ICD-10-CM | POA: Diagnosis present

## 2021-06-27 DIAGNOSIS — J8489 Other specified interstitial pulmonary diseases: Secondary | ICD-10-CM | POA: Diagnosis not present

## 2021-06-27 DIAGNOSIS — I350 Nonrheumatic aortic (valve) stenosis: Secondary | ICD-10-CM | POA: Diagnosis not present

## 2021-06-27 DIAGNOSIS — Z8249 Family history of ischemic heart disease and other diseases of the circulatory system: Secondary | ICD-10-CM | POA: Diagnosis not present

## 2021-06-27 DIAGNOSIS — Z808 Family history of malignant neoplasm of other organs or systems: Secondary | ICD-10-CM

## 2021-06-27 DIAGNOSIS — Z7984 Long term (current) use of oral hypoglycemic drugs: Secondary | ICD-10-CM

## 2021-06-27 DIAGNOSIS — J45909 Unspecified asthma, uncomplicated: Secondary | ICD-10-CM | POA: Diagnosis present

## 2021-06-27 DIAGNOSIS — R9431 Abnormal electrocardiogram [ECG] [EKG]: Secondary | ICD-10-CM

## 2021-06-27 DIAGNOSIS — M1990 Unspecified osteoarthritis, unspecified site: Secondary | ICD-10-CM | POA: Diagnosis not present

## 2021-06-27 DIAGNOSIS — I110 Hypertensive heart disease with heart failure: Secondary | ICD-10-CM | POA: Diagnosis present

## 2021-06-27 DIAGNOSIS — Z6834 Body mass index (BMI) 34.0-34.9, adult: Secondary | ICD-10-CM

## 2021-06-27 DIAGNOSIS — Z961 Presence of intraocular lens: Secondary | ICD-10-CM | POA: Diagnosis not present

## 2021-06-27 HISTORY — DX: Other disorders of intestinal carbohydrate absorption: E74.39

## 2021-06-27 LAB — CBC WITH DIFFERENTIAL/PLATELET
Abs Immature Granulocytes: 0.03 10*3/uL (ref 0.00–0.07)
Basophils Absolute: 0.1 10*3/uL (ref 0.0–0.1)
Basophils Relative: 1 %
Eosinophils Absolute: 0 10*3/uL (ref 0.0–0.5)
Eosinophils Relative: 0 %
HCT: 43.8 % (ref 39.0–52.0)
Hemoglobin: 15.2 g/dL (ref 13.0–17.0)
Immature Granulocytes: 0 %
Lymphocytes Relative: 22 %
Lymphs Abs: 1.9 10*3/uL (ref 0.7–4.0)
MCH: 32.9 pg (ref 26.0–34.0)
MCHC: 34.7 g/dL (ref 30.0–36.0)
MCV: 94.8 fL (ref 80.0–100.0)
Monocytes Absolute: 0.6 10*3/uL (ref 0.1–1.0)
Monocytes Relative: 7 %
Neutro Abs: 5.9 10*3/uL (ref 1.7–7.7)
Neutrophils Relative %: 70 %
Platelets: 232 10*3/uL (ref 150–400)
RBC: 4.62 MIL/uL (ref 4.22–5.81)
RDW: 13.3 % (ref 11.5–15.5)
WBC: 8.5 10*3/uL (ref 4.0–10.5)
nRBC: 0 % (ref 0.0–0.2)

## 2021-06-27 LAB — COMPREHENSIVE METABOLIC PANEL
ALT: 14 U/L (ref 0–44)
AST: 23 U/L (ref 15–41)
Albumin: 4.1 g/dL (ref 3.5–5.0)
Alkaline Phosphatase: 40 U/L (ref 38–126)
Anion gap: 10 (ref 5–15)
BUN: 12 mg/dL (ref 8–23)
CO2: 23 mmol/L (ref 22–32)
Calcium: 8.7 mg/dL — ABNORMAL LOW (ref 8.9–10.3)
Chloride: 101 mmol/L (ref 98–111)
Creatinine, Ser: 0.79 mg/dL (ref 0.61–1.24)
GFR, Estimated: >60 mL/min (ref >60)
Glucose, Bld: 122 mg/dL — ABNORMAL HIGH (ref 70–99)
Potassium: 3.6 mmol/L (ref 3.5–5.1)
Sodium: 134 mmol/L — ABNORMAL LOW (ref 135–145)
Total Bilirubin: 0.7 mg/dL (ref 0.3–1.2)
Total Protein: 7.6 g/dL (ref 6.5–8.1)

## 2021-06-27 LAB — GLUCOSE, CAPILLARY
Glucose-Capillary: 152 mg/dL — ABNORMAL HIGH (ref 70–99)
Glucose-Capillary: 189 mg/dL — ABNORMAL HIGH (ref 70–99)

## 2021-06-27 LAB — BRAIN NATRIURETIC PEPTIDE: B Natriuretic Peptide: 232 pg/mL — ABNORMAL HIGH (ref 0.0–100.0)

## 2021-06-27 LAB — TROPONIN I (HIGH SENSITIVITY)
Troponin I (High Sensitivity): 10 ng/L (ref <18)
Troponin I (High Sensitivity): 10 ng/L (ref <18)

## 2021-06-27 LAB — TSH: TSH: 2.119 u[IU]/mL (ref 0.350–4.500)

## 2021-06-27 LAB — MAGNESIUM: Magnesium: 2 mg/dL (ref 1.7–2.4)

## 2021-06-27 MED ORDER — AZITHROMYCIN 250 MG PO TABS: 500.0000 mg | ORAL_TABLET | Freq: Every day | ORAL | Status: DC

## 2021-06-27 MED ORDER — LEVALBUTEROL HCL 1.25 MG/0.5ML IN NEBU
1.2500 mg | INHALATION_SOLUTION | Freq: Three times a day (TID) | RESPIRATORY_TRACT | Status: DC
  Administered 2021-06-27 – 2021-06-30 (×9): 1.25 mg via RESPIRATORY_TRACT
  Filled 2021-06-27 (×9): qty 0.5

## 2021-06-27 MED ORDER — DILTIAZEM HCL 25 MG/5ML IV SOLN
10.0000 mg | Freq: Once | INTRAVENOUS | Status: AC
  Administered 2021-06-27: 10 mg via INTRAVENOUS
  Filled 2021-06-27: qty 5

## 2021-06-27 MED ORDER — MAGNESIUM SULFATE 2 GM/50ML IV SOLN
2.0000 g | Freq: Once | INTRAVENOUS | Status: AC
  Administered 2021-06-27: 2 g via INTRAVENOUS
  Filled 2021-06-27: qty 50

## 2021-06-27 MED ORDER — ACETAMINOPHEN 650 MG RE SUPP: 650.0000 mg | Freq: Four times a day (QID) | RECTAL | Status: DC | PRN

## 2021-06-27 MED ORDER — METOPROLOL TARTRATE 5 MG/5ML IV SOLN
5.0000 mg | Freq: Four times a day (QID) | INTRAVENOUS | Status: DC | PRN
  Administered 2021-06-28: 03:00:00 5 mg via INTRAVENOUS
  Filled 2021-06-27: qty 5

## 2021-06-27 MED ORDER — POTASSIUM CHLORIDE CRYS ER 20 MEQ PO TBCR
40.0000 meq | EXTENDED_RELEASE_TABLET | Freq: Once | ORAL | Status: AC
  Administered 2021-06-27: 40 meq via ORAL
  Filled 2021-06-27: qty 2

## 2021-06-27 MED ORDER — METOPROLOL TARTRATE 5 MG/5ML IV SOLN
5.0000 mg | Freq: Once | INTRAVENOUS | Status: AC
Start: 2021-06-27 — End: 2021-06-27
  Administered 2021-06-27: 5 mg via INTRAVENOUS
  Filled 2021-06-27: qty 5

## 2021-06-27 MED ORDER — ONDANSETRON HCL 4 MG/2ML IJ SOLN: 4.0000 mg | Freq: Four times a day (QID) | INTRAMUSCULAR | Status: DC | PRN

## 2021-06-27 MED ORDER — LORATADINE 10 MG PO TABS: 10.0000 mg | ORAL_TABLET | Freq: Every day | ORAL | Status: DC | PRN

## 2021-06-27 MED ORDER — APIXABAN 5 MG PO TABS
5.0000 mg | ORAL_TABLET | Freq: Once | ORAL | Status: AC
  Administered 2021-06-27: 5 mg via ORAL
  Filled 2021-06-27: qty 1

## 2021-06-27 MED ORDER — METOPROLOL SUCCINATE ER 25 MG PO TB24
25.0000 mg | ORAL_TABLET | Freq: Every day | ORAL | Status: DC
  Administered 2021-06-27: 25 mg via ORAL
  Filled 2021-06-27 (×3): qty 1

## 2021-06-27 MED ORDER — LEVALBUTEROL HCL 1.25 MG/0.5ML IN NEBU: 1.2500 mg | INHALATION_SOLUTION | Freq: Four times a day (QID) | RESPIRATORY_TRACT | Status: DC | PRN

## 2021-06-27 MED ORDER — SODIUM CHLORIDE 0.9 % IV SOLN
2.0000 g | Freq: Every day | INTRAVENOUS | Status: AC
  Administered 2021-06-27 – 2021-07-01 (×5): 2 g via INTRAVENOUS
  Filled 2021-06-27 (×5): qty 20

## 2021-06-27 MED ORDER — ONDANSETRON HCL 4 MG PO TABS: 4.0000 mg | ORAL_TABLET | Freq: Four times a day (QID) | ORAL | Status: DC | PRN

## 2021-06-27 MED ORDER — APIXABAN 5 MG PO TABS
5.0000 mg | ORAL_TABLET | Freq: Two times a day (BID) | ORAL | Status: DC
  Administered 2021-06-27 – 2021-07-04 (×14): 5 mg via ORAL
  Filled 2021-06-27 (×15): qty 1

## 2021-06-27 MED ORDER — ALBUTEROL SULFATE HFA 108 (90 BASE) MCG/ACT IN AERS
2.0000 | INHALATION_SPRAY | Freq: Once | RESPIRATORY_TRACT | Status: DC
  Filled 2021-06-27: qty 6.7

## 2021-06-27 MED ORDER — SODIUM CHLORIDE 0.9 % IV SOLN
100.0000 mg | Freq: Two times a day (BID) | INTRAVENOUS | Status: DC
  Administered 2021-06-27 – 2021-06-28 (×3): 100 mg via INTRAVENOUS
  Filled 2021-06-27 (×4): qty 100

## 2021-06-27 MED ORDER — SODIUM CHLORIDE 0.9 % IV BOLUS
1000.0000 mL | Freq: Once | INTRAVENOUS | Status: AC
  Administered 2021-06-27: 1000 mL via INTRAVENOUS

## 2021-06-27 MED ORDER — ACETAMINOPHEN 325 MG PO TABS
650.0000 mg | ORAL_TABLET | Freq: Four times a day (QID) | ORAL | Status: DC | PRN
  Administered 2021-06-29 – 2021-07-03 (×2): 650 mg via ORAL
  Filled 2021-06-27 (×2): qty 2

## 2021-06-27 MED ORDER — METFORMIN HCL 500 MG PO TABS
500.0000 mg | ORAL_TABLET | Freq: Two times a day (BID) | ORAL | Status: DC
  Administered 2021-06-27 – 2021-07-04 (×14): 500 mg via ORAL
  Filled 2021-06-27 (×14): qty 1

## 2021-06-27 MED ORDER — ATORVASTATIN CALCIUM 20 MG PO TABS
20.0000 mg | ORAL_TABLET | Freq: Every day | ORAL | Status: DC
  Administered 2021-06-27 – 2021-07-04 (×8): 20 mg via ORAL
  Filled 2021-06-27 (×5): qty 1
  Filled 2021-06-27: qty 2
  Filled 2021-06-27 (×2): qty 1

## 2021-06-27 MED ORDER — METHYLPREDNISOLONE SODIUM SUCC 40 MG IJ SOLR
40.0000 mg | Freq: Once | INTRAMUSCULAR | Status: AC
  Administered 2021-06-27: 40 mg via INTRAVENOUS
  Filled 2021-06-27: qty 1

## 2021-06-27 MED ORDER — HYDROCHLOROTHIAZIDE 12.5 MG PO TABS
12.5000 mg | ORAL_TABLET | Freq: Every day | ORAL | Status: DC
  Administered 2021-06-28: 12.5 mg via ORAL
  Filled 2021-06-27: qty 1

## 2021-06-27 NOTE — H&P (Signed)
History and Physical    Jeffrey Andrews:[id number] DOB: 1946/03/26 DOA: 06/27/2021  PCP: Shirline Frees, MD  Patient coming from: Home.  I have personally briefly reviewed patient's old medical records in [facility]  Chief Complaint: Shortness of breath and cough.  HPI: Jeffrey Andrews is a 76 y.o. male with medical history significant of osteoarthritis, skin cancer of the left arm, hyperhidrosis, glucose intolerance, history of functional heart murmur, little deficits, hypertension, sleep apnea on CPAP, class I obesity with a BMI of 34.82 kg/m who is coming to the emergency department with complaints of progressively worse dyspnea associated with postnasal drip, dry cough which is occasionally productive, wheezing and fatigue.  No fever, chills, sore throat, hemoptysis, nausea, emesis, diaphoresis, appetite changes, but has had trouble sleeping because of the dyspnea.  He has felt like chest pressure that bothers him more with inspiration.  Denied PND, orthopnea or pitting edema of the lower extremities.  Denied abdominal pain, diarrhea, constipation, melena hematochezia.  Denied flank pain, dysuria, frequency or hematuria.  No polyuria, polydipsia, polyphagia or blurred vision.  ED Course: Initial vital signs were temperature 98.4 F, pulse 145, respiration 17, BP 150/122 mmHg and O2 sat 99% on nasal cannula oxygen.  The patient received Cardizem 10 mg IVP x2, Eliquis 5 mg p.o. x1 and 1000 mL of NS bolus.  I added metoprolol 5 mg IVP, K-Dur 40 mEq p.o. and magnesium sulfate 2 g IVPB.  Lab work: CBC was normal with a white count of 8.5, hemoglobin 15.2 g/dL and platelets 232.  Troponin and TSH were normal.  BNP was 232.0 pg/mL.  CMP showed a glucose of 122 and calcium of 8.7 mg/dL.  The rest of the CMP values were unremarkable since sodium corrected to hyperglycemia.  Imaging: 2 view chest radiograph show developing bronchopneumonia.  Please see images and full radiology report for further  details.  Review of Systems: As per HPI otherwise all other systems reviewed and are negative.  Past Medical History:  Diagnosis Date   Arthritis    Cancer ([place]) 2014   skin cancer left arm   Excessive sweating    Glucose intolerance 06/27/2021   Heart murmur    "functional heart murmur"   History of kidney stones    History of skin cancer    Hypertension    Sleep apnea    uses c-pap    Past Surgical History:  Procedure Laterality Date   BACK SURGERY     x3 last back surgery 1984   EYE SURGERY     cataracts with lens implants   STERIOD INJECTION Right 06/08/2015   Procedure: STEROID INJECTION;  Surgeon: Gaynelle Arabian, MD;  [facility];  Service: Orthopedics;  Laterality: Right;   TENDON REPAIR Left 2000   knee   TONSILLECTOMY     TOTAL KNEE ARTHROPLASTY Left 06/08/2015   Procedure: TOTAL KNEE ARTHROPLASTY;  Surgeon: Gaynelle Arabian, MD;  [facility];  Service: Orthopedics;  Laterality: Left;   TOTAL KNEE ARTHROPLASTY Right 11/09/2015   Procedure: RIGHT TOTAL KNEE ARTHROPLASTY;  Surgeon: Gaynelle Arabian, MD;  [facility];  Service: Orthopedics;  Laterality: Right;   WRIST SURGERY  2008   left   Social History  reports that he has never smoked. He quit smokeless tobacco use about 28 years ago. He reports that he does not drink alcohol and does not use drugs.  No Known Allergies  Family History  Problem Relation Age of Onset   Hypertension Mother  Hypertension Father    Diabetes type II Sister    Kidney disease Other    Brain cancer Paternal Uncle    Heart attack Maternal Uncle    Prior to Admission medications   Medication Sig Start Date End Date Taking? Authorizing Provider  acetaminophen (TYLENOL) 500 MG tablet Take 500 mg by mouth every 6 (six) hours as needed (For pain.).   Yes [provider]  atorvastatin (LIPITOR) 20 MG tablet Take 20 mg by mouth daily.   Yes [provider]  hydrochlorothiazide (HYDRODIURIL) 25 MG tablet Take 12.5  mg by mouth daily.    Yes [provider]  ipratropium (ATROVENT) 0.03 % nasal spray Place 1 spray into both nostrils daily as needed for rhinitis.   Yes [provider]  loratadine (CLARITIN) 10 MG tablet Take 10 mg by mouth daily as needed for allergies or rhinitis.   Yes [provider]  metFORMIN (GLUCOPHAGE) 500 MG tablet Take 500 mg by mouth 2 (two) times daily with a meal.   Yes [provider]  Multiple Vitamin (MULTI-VITAMIN) tablet Take 1 tablet by mouth daily.   Yes [provider]  Omega-3 Fatty Acids (FISH OIL) 1000 MG CAPS Take 1 capsule by mouth 2 (two) times daily.   Yes [provider]  methocarbamol (ROBAXIN) 500 MG tablet Take 1 tablet (500 mg total) by mouth every 6 (six) hours as needed for muscle spasms. Patient not taking: Reported on 06/27/2021 11/10/15   [place], [employer], PA-C  oxyCODONE (OXY IR/ROXICODONE) 5 MG immediate release tablet Take 1-2 tablets (5-10 mg total) by mouth every 3 (three) hours as needed for breakthrough pain. Patient not taking: Reported on 06/27/2021 11/10/15   [place], [employer], PA-C  rivaroxaban (XARELTO) 10 MG TABS tablet Take 1 tablet (10 mg total) by mouth daily with breakfast. Patient not taking: Reported on 06/27/2021 11/10/15   [place], [employer], PA-C  traMADol (ULTRAM) 50 MG tablet Take 1-2 tablets (50-100 mg total) by mouth every 6 (six) hours as needed for moderate pain. Patient not taking: Reported on 06/27/2021 11/10/15   Sheryle Hail, PA-C    Physical Exam: Vitals:   06/27/21 1230 06/27/21 1245 06/27/21 1300 06/27/21 1315  BP: (!) 130/94 128/87 (!) 144/85 131/87  Pulse:    (!) 108  Resp: (!) 22 20 (!) 23 (!) 22  Temp:      TempSrc:      SpO2: 93% 95% 94% 92%  Weight:      Height:        Constitutional: NAD, calm, comfortable Eyes: PERRL, lids and conjunctivae normal.  Mild bilateral conjunctival injection. ENMT: Mucous membranes are moist. Posterior pharynx clear of  any exudate or lesions. Neck: normal, supple, no masses, no thyromegaly Respiratory: Decreased breath sounds with bilateral wheezing, no crackles. Normal respiratory effort. No accessory muscle use.  Cardiovascular: Tachycardic in the 100s and 110s with an irregularly irregular rhythm, no murmurs / rubs / gallops. No extremity edema. 2+ pedal pulses. No carotid bruits.  Abdomen: Obese, no distention.  Soft, no tenderness, no masses palpated. No hepatosplenomegaly. Bowel sounds positive.  Musculoskeletal: no clubbing / cyanosis. Good ROM, no contractures. Normal muscle tone.  Skin: no rashes, lesions, ulcers on very limited dermatological examination. Neurologic: CN 2-12 grossly intact. Sensation intact, DTR normal. Strength 5/5 in all 4.  Psychiatric: Normal judgment and insight. Alert and oriented x 3. Normal mood.   Labs on Admission: I have personally reviewed following labs and imaging studies  CBC: Recent Labs  Lab 06/27/21 1035  WBC 8.5  NEUTROABS 5.9  HGB 15.2  HCT 43.8  MCV 94.8  PLT 440    Basic Metabolic Panel: Recent Labs  Lab 06/27/21 1035  NA 134*  K 3.6  CL 101  CO2 23  GLUCOSE 122*  BUN 12  CREATININE 0.79  CALCIUM 8.7*  MG 2.0    GFR: Estimated Creatinine Clearance: 91.8 mL/min (by C-G formula based on SCr of 0.79 mg/dL).  Liver Function Tests: Recent Labs  Lab 06/27/21 1035  AST 23  ALT 14  ALKPHOS 40  BILITOT 0.7  PROT 7.6  ALBUMIN 4.1   Radiological Exams on Admission: DG Chest 2 View  Result Date: 06/27/2021 CLINICAL DATA:  76 year old male with history of shortness of breath for the past 3-4 days. EXAM: CHEST - 2 VIEW COMPARISON:  Chest x-ray 05/05/2012. FINDINGS: Lung volumes are normal. Widespread interstitial prominence, diffuse peribronchial cuffing, and patchy ill-defined opacities in the lung bases bilaterally. No pleural effusions. No pneumothorax. No pulmonary nodule or mass noted. Pulmonary vasculature and the cardiomediastinal  silhouette are within normal limits. Atherosclerotic calcifications in the thoracic aorta. IMPRESSION: 1. The appearance of the chest is compatible with severe bronchitis, potentially with developing bilateral lower lobe bronchopneumonia. 2. Aortic atherosclerosis. Electronically Signed   By: Vinnie Langton M.D.   On: 06/27/2021 11:16    EKG: Independently reviewed.  Vent. rate 137 BPM PR interval * ms QRS duration 99 ms QT/QTcB 337/509 ms P-R-T axes * 100 -11 Atrial fibrillation Anterior infarct, old Repolarization abnormality, prob rate related Prolonged QT interval  Assessment/Plan Principal Problem:   New onset atrial fibrillation with RVR (HCC) CHA?DS?-VASc Score of at least 5. (Age, HTN, atherosclerosis and glucose intolerance). Observation/PCU. Optimize electrolytes. Maintain euvolemia. Check echocardiogram. Consult cardiology in AM. Continue apixaban 5 mg p.o. twice daily. Begin metoprolol succinate 25 mg p.o. daily. Metoprolol 5 mg IVP q 6 hr PRN for HR > 100 bpm  Active Problems:    Prolonged QT interval Optimize potassium level. Magnesium sulfate 2 g IVPB given. Continue rate control meds. Avoid QT prolonging agents. Follow EKG in AM.   Bronchial pneumonia Start ceftriaxone 2 g every 24 hours. Doxycycline 100 mg IVPB every 24 hours. Check sputum gram stain, culture and sensitivity. Check strep pneumoniae urinary antigen.    Reactive airway disease Xopenex nebs 3 times a day. Xopenex nebs as needed q 6 hr. Solu-Medrol 40 mg IVP x1 dose.    Sleep apnea syndrome Continue CPAP at bedtime.    Hypocalcemia Recheck calcium and albumin level in AM.    Aortic atherosclerosis (HCC)   Hyperlipidemia Continue atorvastatin 20 mg p.o. daily.    Glucose intolerance Carbohydrate modified diet. Check hemoglobin A1c. CBG monitoring before meals and bedtime.    DVT prophylaxis: On apixaban. Code Status:   Full code. Family Communication:  His wife was at  bedside. Disposition Plan:   Patient is from:  Home.  Anticipated DC to:  Home.  Anticipated DC date:  06/28/2021 or 06/29/2021.  Anticipated DC barriers: Clinical condition.  Consults called:  Left message for cardiology Master Gay Filler). Admission status:  Observation/PCU.  Severity of Illness: High severity in the setting of atrial fibrillation with RVR.  Reubin Milan MD [facility]  How to contact the [facility] Attending or Consulting provider [place] or covering provider during after hours [place], for this patient?   Check the care team in [facility] and look for a) attending/consulting [place] provider listed and b)  the [facility] team listed Log into [email or web address] and use [place]'s universal password to access. If you do not have the password, please contact the hospital operator. Locate the [facility] provider you are looking for under [facility] and page to a number that you can be directly reached. If you still have difficulty reaching the provider, please page the [facility] (Director on Call) for the Hospitalists listed on amion for assistance.  06/27/2021, 1:39 PM   This document was prepared using Dragon voice recognition software and may contain some unintended transcription errors.

## 2021-06-27 NOTE — Progress Notes (Signed)
Patient had to take CPAP off because he said he was unable to tolerate it. RN placed patient back on [place].

## 2021-06-27 NOTE — ED Triage Notes (Signed)
Pt reports SHOB x3-4 days that is worse with exertion. Denies chest pain, dizziness, and palpitations. Denies hx of afib.

## 2021-06-27 NOTE — ED Provider Notes (Signed)
[facility] DEPT Provider Note   CSN: [id number] Arrival date & time: 06/27/21  1014     History  Chief Complaint  Patient presents with   Shortness of Breath    Jeffrey Andrews is a 76 y.o. male with a past medical history of diabetes presenting today with complaint of shortness of breath for the he reports that this has been getting worse.  Originally it was just when he was walking or exerting himself however over the past 4 days it is now when he is bending over trying to put on his socks, laying down and sometimes when just sitting.  Denies any fevers, chills, chest pain.  Reports an occasional dry cough.  No history of MI, DVT/PE or cardiac arrhythmia.  Denies any extremity or abdominal edema.  Home Medications Prior to Admission medications   Medication Sig Start Date End Date Taking? Authorizing Provider  acetaminophen (TYLENOL) 500 MG tablet Take 500 mg by mouth every 6 (six) hours as needed (For pain.).   Yes [provider]  atorvastatin (LIPITOR) 20 MG tablet Take 20 mg by mouth daily.   Yes [provider]  hydrochlorothiazide (HYDRODIURIL) 25 MG tablet Take 12.5 mg by mouth daily.    Yes [provider]  ipratropium (ATROVENT) 0.03 % nasal spray Place 1 spray into both nostrils daily as needed for rhinitis.   Yes [provider]  loratadine (CLARITIN) 10 MG tablet Take 10 mg by mouth daily as needed for allergies or rhinitis.   Yes [provider]  metFORMIN (GLUCOPHAGE) 500 MG tablet Take 500 mg by mouth 2 (two) times daily with a meal.   Yes [provider]  Multiple Vitamin (MULTI-VITAMIN) tablet Take 1 tablet by mouth daily.   Yes [provider]  methocarbamol (ROBAXIN) 500 MG tablet Take 1 tablet (500 mg total) by mouth every 6 (six) hours as needed for muscle spasms. Patient not taking: Reported on 06/27/2021 11/10/15   [place], [employer], PA-C  oxyCODONE (OXY IR/ROXICODONE) 5 MG  immediate release tablet Take 1-2 tablets (5-10 mg total) by mouth every 3 (three) hours as needed for breakthrough pain. Patient not taking: Reported on 06/27/2021 11/10/15   [place], [employer], PA-C  rivaroxaban (XARELTO) 10 MG TABS tablet Take 1 tablet (10 mg total) by mouth daily with breakfast. Patient not taking: Reported on 06/27/2021 11/10/15   [place], [employer], PA-C  traMADol (ULTRAM) 50 MG tablet Take 1-2 tablets (50-100 mg total) by mouth every 6 (six) hours as needed for moderate pain. Patient not taking: Reported on 06/27/2021 11/10/15   Sheryle Hail, PA-C      Allergies    Patient has no known allergies.    Review of Systems   Review of Systems  Constitutional:  Negative for chills and fever.  Respiratory:  Positive for shortness of breath. Negative for chest tightness.   Cardiovascular:  Negative for chest pain and palpitations.  Gastrointestinal:  Negative for abdominal pain.  Neurological:  Negative for weakness and light-headedness.   Physical Exam Updated Vital Signs BP (!) 152/105    Pulse (!) 138    Temp 98.4 F (36.9 C) (Oral)    Resp (!) 23    SpO2 93%  Physical Exam Vitals and nursing note reviewed.  Constitutional:      Appearance: Normal appearance.  HENT:     Head: Normocephalic and atraumatic.  Eyes:     General: No scleral icterus.    Conjunctiva/sclera: Conjunctivae normal.  Pulmonary:  Effort: Pulmonary effort is normal. No respiratory distress.  Skin:    Findings: No rash.  Neurological:     Mental Status: He is alert.  Psychiatric:        Mood and Affect: Mood normal.    ED Results / Procedures / Treatments   Labs (all labs ordered are listed, but only abnormal results are displayed) Labs Reviewed  CBC WITH DIFFERENTIAL/PLATELET  COMPREHENSIVE METABOLIC PANEL  MAGNESIUM  TSH  BRAIN NATRIURETIC PEPTIDE  TROPONIN I (HIGH SENSITIVITY)    EKG EKG Interpretation  Date/Time:  Sunday June 27 2021 10:26:12 EST Ventricular  Rate:  137 PR Interval:    QRS Duration: 99 QT Interval:  337 QTC Calculation: 509 R Axis:   100 Text Interpretation: Atrial fibrillation Anterior infarct, old Repolarization abnormality, prob rate related Prolonged QT interval Confirmed by Thamas Jaegers ([id number]) on 06/27/2021 11:36:01 AM  Radiology DG Chest 2 View  Result Date: 06/27/2021 CLINICAL DATA:  76 year old male with history of shortness of breath for the past 3-4 days. EXAM: CHEST - 2 VIEW COMPARISON:  Chest x-ray 05/05/2012. FINDINGS: Lung volumes are normal. Widespread interstitial prominence, diffuse peribronchial cuffing, and patchy ill-defined opacities in the lung bases bilaterally. No pleural effusions. No pneumothorax. No pulmonary nodule or mass noted. Pulmonary vasculature and the cardiomediastinal silhouette are within normal limits. Atherosclerotic calcifications in the thoracic aorta. IMPRESSION: 1. The appearance of the chest is compatible with severe bronchitis, potentially with developing bilateral lower lobe bronchopneumonia. 2. Aortic atherosclerosis. Electronically Signed   By: Vinnie Langton M.D.   On: 06/27/2021 11:16    Procedures .Critical Care Performed by: Rhae Hammock, PA-C Authorized by: Rhae Hammock, PA-C   Critical care provider statement:    Critical care time (minutes):  30   Critical care time was exclusive of:  Separately billable procedures and treating other patients   Critical care was necessary to treat or prevent imminent or life-threatening deterioration of the following conditions:  Cardiac failure and circulatory failure   Critical care was time spent personally by me on the following activities:  Development of treatment plan with patient or surrogate, discussions with consultants, evaluation of patient's response to treatment, examination of patient, ordering and review of laboratory studies, ordering and review of radiographic studies, ordering and performing treatments and  interventions, pulse oximetry, re-evaluation of patient's condition and review of old charts   I assumed direction of critical care for this patient from another provider in my specialty: no     Care discussed with: admitting provider      Medications Ordered in ED Medications  sodium chloride 0.9 % bolus 1,000 mL (has no administration in time range)  diltiazem (CARDIZEM) injection 10 mg (has no administration in time range)  albuterol (VENTOLIN HFA) 108 (90 Base) MCG/ACT inhaler 2 puff (has no administration in time range)    ED Course/ Medical Decision Making/ A&P                           Medical Decision Making Amount and/or Complexity of Data Reviewed Labs: ordered. Radiology: ordered.  Risk Prescription drug management.   Patient presents to the ED for concern of SOB. The emergent differential diagnosis for shortness of breath includes, but is not limited to, Pulmonary edema, bronchoconstriction, Pneumonia, Pulmonary embolism, Pneumotherax/ Hemothorax, Dysrythmia, ACS.     Co morbidities that complicate the patient evaluation  DM2  Additional history obtained:  Additional history obtained from chart review.  Patient does not have a history of any cardiac illness or procedures.  No history of A. fib, previous use of Xarelto in 2017 however this was orthopedic surgery.   Lab Tests:  I Ordered, and personally interpreted labs.  The pertinent results include:     Imaging Studies ordered:  I ordered imaging studies including CXR I independently visualized and interpreted imaging and agree with the radiologist that there is evidence of severe bronchitis.   Cardiac Monitoring:  The patient was maintained on a cardiac monitor.  I personally viewed and interpreted the cardiac monitored which showed an underlying rhythm of: Afib RVR, rates from 110s-140s.   Medicines ordered and prescription drug management:  I ordered medication including Diltiazem 20 and fluids   for HR. Eliquis for potential clotting.   Critical Interventions:  Meds to get HR down   Problem List / ED Course:  Afib RVR, severe bronchitis.  Reevaluation:  After the interventions noted above, I reevaluated the patient and found that they have :improved. HR now 80.   Dispostion:  After consideration of the diagnostic results and the patients response to treatment, I feel that the patent would benefit from admission for further work-up of his new onset A. fib and treatment of his shortness of breath/bronchitis.  Final Clinical Impression(s) / ED Diagnoses Final diagnoses:  Bronchitis  New onset a-fib [facility])    Rx / DC Orders Admit to hospitalist, Dr. Su Hoff, [place], PA-C 06/27/21 1155    Luna Fuse, MD 06/30/21 [phone]

## 2021-06-28 ENCOUNTER — Observation Stay (HOSPITAL_COMMUNITY): Payer: Medicare Other

## 2021-06-28 DIAGNOSIS — Z9849 Cataract extraction status, unspecified eye: Secondary | ICD-10-CM | POA: Diagnosis not present

## 2021-06-28 DIAGNOSIS — Z79899 Other long term (current) drug therapy: Secondary | ICD-10-CM | POA: Diagnosis not present

## 2021-06-28 DIAGNOSIS — J40 Bronchitis, not specified as acute or chronic: Secondary | ICD-10-CM | POA: Diagnosis present

## 2021-06-28 DIAGNOSIS — Z6834 Body mass index (BMI) 34.0-34.9, adult: Secondary | ICD-10-CM | POA: Diagnosis not present

## 2021-06-28 DIAGNOSIS — I350 Nonrheumatic aortic (valve) stenosis: Secondary | ICD-10-CM | POA: Diagnosis not present

## 2021-06-28 DIAGNOSIS — E669 Obesity, unspecified: Secondary | ICD-10-CM | POA: Diagnosis present

## 2021-06-28 DIAGNOSIS — Z87891 Personal history of nicotine dependence: Secondary | ICD-10-CM | POA: Diagnosis not present

## 2021-06-28 DIAGNOSIS — I4891 Unspecified atrial fibrillation: Secondary | ICD-10-CM

## 2021-06-28 DIAGNOSIS — Z7984 Long term (current) use of oral hypoglycemic drugs: Secondary | ICD-10-CM | POA: Diagnosis not present

## 2021-06-28 DIAGNOSIS — I4819 Other persistent atrial fibrillation: Secondary | ICD-10-CM | POA: Diagnosis present

## 2021-06-28 DIAGNOSIS — I110 Hypertensive heart disease with heart failure: Secondary | ICD-10-CM | POA: Diagnosis present

## 2021-06-28 DIAGNOSIS — Z808 Family history of malignant neoplasm of other organs or systems: Secondary | ICD-10-CM | POA: Diagnosis not present

## 2021-06-28 DIAGNOSIS — Z96653 Presence of artificial knee joint, bilateral: Secondary | ICD-10-CM | POA: Diagnosis present

## 2021-06-28 DIAGNOSIS — E1165 Type 2 diabetes mellitus with hyperglycemia: Secondary | ICD-10-CM | POA: Diagnosis present

## 2021-06-28 DIAGNOSIS — I10 Essential (primary) hypertension: Secondary | ICD-10-CM | POA: Diagnosis not present

## 2021-06-28 DIAGNOSIS — Z20822 Contact with and (suspected) exposure to covid-19: Secondary | ICD-10-CM | POA: Diagnosis present

## 2021-06-28 DIAGNOSIS — I5041 Acute combined systolic (congestive) and diastolic (congestive) heart failure: Secondary | ICD-10-CM | POA: Diagnosis present

## 2021-06-28 DIAGNOSIS — Z961 Presence of intraocular lens: Secondary | ICD-10-CM | POA: Diagnosis present

## 2021-06-28 DIAGNOSIS — Z833 Family history of diabetes mellitus: Secondary | ICD-10-CM | POA: Diagnosis not present

## 2021-06-28 DIAGNOSIS — Z8249 Family history of ischemic heart disease and other diseases of the circulatory system: Secondary | ICD-10-CM | POA: Diagnosis not present

## 2021-06-28 DIAGNOSIS — Z85828 Personal history of other malignant neoplasm of skin: Secondary | ICD-10-CM | POA: Diagnosis not present

## 2021-06-28 DIAGNOSIS — E785 Hyperlipidemia, unspecified: Secondary | ICD-10-CM | POA: Diagnosis present

## 2021-06-28 DIAGNOSIS — I700 Atherosclerosis of aorta: Secondary | ICD-10-CM | POA: Diagnosis present

## 2021-06-28 DIAGNOSIS — J180 Bronchopneumonia, unspecified organism: Secondary | ICD-10-CM | POA: Diagnosis present

## 2021-06-28 DIAGNOSIS — J45909 Unspecified asthma, uncomplicated: Secondary | ICD-10-CM | POA: Diagnosis present

## 2021-06-28 DIAGNOSIS — I493 Ventricular premature depolarization: Secondary | ICD-10-CM | POA: Diagnosis present

## 2021-06-28 LAB — CBC
HCT: 43 % (ref 39.0–52.0)
Hemoglobin: 14.9 g/dL (ref 13.0–17.0)
MCH: 33 pg (ref 26.0–34.0)
MCHC: 34.7 g/dL (ref 30.0–36.0)
MCV: 95.1 fL (ref 80.0–100.0)
Platelets: 249 10*3/uL (ref 150–400)
RBC: 4.52 MIL/uL (ref 4.22–5.81)
RDW: 13.3 % (ref 11.5–15.5)
WBC: 6.2 10*3/uL (ref 4.0–10.5)
nRBC: 0 % (ref 0.0–0.2)

## 2021-06-28 LAB — COMPREHENSIVE METABOLIC PANEL
ALT: 12 U/L (ref 0–44)
AST: 23 U/L (ref 15–41)
Albumin: 4 g/dL (ref 3.5–5.0)
Alkaline Phosphatase: 39 U/L (ref 38–126)
Anion gap: 11 (ref 5–15)
BUN: 13 mg/dL (ref 8–23)
CO2: 23 mmol/L (ref 22–32)
Calcium: 8.8 mg/dL — ABNORMAL LOW (ref 8.9–10.3)
Chloride: 103 mmol/L (ref 98–111)
Creatinine, Ser: 0.69 mg/dL (ref 0.61–1.24)
GFR, Estimated: >60 mL/min (ref >60)
Glucose, Bld: 149 mg/dL — ABNORMAL HIGH (ref 70–99)
Potassium: 3.9 mmol/L (ref 3.5–5.1)
Sodium: 137 mmol/L (ref 135–145)
Total Bilirubin: 1 mg/dL (ref 0.3–1.2)
Total Protein: 7.5 g/dL (ref 6.5–8.1)

## 2021-06-28 LAB — GLUCOSE, CAPILLARY
Glucose-Capillary: 120 mg/dL — ABNORMAL HIGH (ref 70–99)
Glucose-Capillary: 140 mg/dL — ABNORMAL HIGH (ref 70–99)
Glucose-Capillary: 101 mg/dL — ABNORMAL HIGH (ref 70–99)
Glucose-Capillary: 143 mg/dL — ABNORMAL HIGH (ref 70–99)

## 2021-06-28 LAB — HEMOGLOBIN A1C
Hgb A1c MFr Bld: 5.8 % — ABNORMAL HIGH (ref 4.8–5.6)
Mean Plasma Glucose: 119.76 mg/dL

## 2021-06-28 LAB — ECHOCARDIOGRAM COMPLETE
Calc EF: 35.9 %
Height: 68 in
S' Lateral: 4.5 cm
Single Plane A2C EF: 37.8 %
Single Plane A4C EF: 31.4 %
Weight: 3664 oz

## 2021-06-28 MED ORDER — METOPROLOL TARTRATE 50 MG PO TABS
50.0000 mg | ORAL_TABLET | Freq: Two times a day (BID) | ORAL | Status: DC
  Administered 2021-06-28 – 2021-07-01 (×8): 50 mg via ORAL
  Filled 2021-06-28 (×8): qty 1

## 2021-06-28 MED ORDER — FUROSEMIDE 10 MG/ML IJ SOLN
40.0000 mg | Freq: Two times a day (BID) | INTRAMUSCULAR | Status: DC
  Administered 2021-06-28 – 2021-07-01 (×6): 40 mg via INTRAVENOUS
  Filled 2021-06-28 (×6): qty 4

## 2021-06-28 MED ORDER — DOXYCYCLINE HYCLATE 100 MG PO TABS
100.0000 mg | ORAL_TABLET | Freq: Two times a day (BID) | ORAL | Status: AC
  Administered 2021-06-28 – 2021-07-01 (×7): 100 mg via ORAL
  Filled 2021-06-28 (×7): qty 1

## 2021-06-28 NOTE — Progress Notes (Signed)
Pt refused cpap tonight.  Pt stated he was unable to tolerate the cpap pressure even after settings were adjusted.  This Probation officer offered to make additional adjustments but pt still refused.  Pt prefers to wear nasal cannula instead if needed.

## 2021-06-28 NOTE — Progress Notes (Signed)
Patient complained to this writer that he has left shoulder pain 5/10 that radiated to the left arm. He denied chest pain or shortness of breath. Patient in no obvious distress. EKG obtained. Provider paged to notify.

## 2021-06-28 NOTE — TOC Progression Note (Signed)
Transition of Care [facility]) - Progression Note    Patient Details  Name: COLIE FUGITT MRN: [id number] Date of Birth: 1946/05/10  Transition of [facility]) CM/SW Contact  Purcell Mouton, RN Phone Number: 06/28/2021, 4:01 PM  Clinical Narrative:     Pt from home with spouse. TOC will continue to follow for discharge needs.   Expected Discharge Plan: Home/Self Care Barriers to Discharge: No Barriers Identified  Expected Discharge Plan and Services Expected Discharge Plan: Home/Self Care     Post Acute Care Choice: [place] arrangements for the past 2 months: Single Family Home                                       Social Determinants of Health (SDOH) Interventions    Readmission Risk Interventions No flowsheet data found.

## 2021-06-28 NOTE — Progress Notes (Signed)
PROGRESS NOTE    Jeffrey Andrews  QVZ:[id number] DOB: [birth year] DOA: 06/27/2021 PCP: Shirline Frees, MD    Brief Narrative:  This 76 years old male with PMH significant for osteoarthritis, skin cancer of the left arm, glucose intolerance, history of functional heart murmur, hypertension, sleep apnea on CPAP, class I obesity, with BMI 34.8, presented in the ED with c/o: Progressively shortness of breath associated with postnasal drip, dry cough which is occasionally productive,  wheezing and fatigue.  Patient also reports chest pressure that bothers him more with inspiration.  He was found to have atrial fibrillation on EKG with RVR.  Patient was given Cardizem x 2, metoprolol.  Chest x-ray shows findings concerning for developing bronchopneumonia.   Patient is admitted for community-acquired pneumonia and atrial fibrillation with RVR.  Cardiology consulted, started on antibiotics.  Assessment & Plan:   Principal Problem:   Atrial fibrillation with RVR (HCC) Active Problems:   Sleep apnea syndrome   Bronchial pneumonia   Reactive airway disease   Hypocalcemia   Aortic atherosclerosis (HCC)   Hyperlipidemia   Glucose intolerance   Prolonged QT interval  New onset atrial fibrillation with RVR: Patient presented with chest pain, shortness of breath on exertion. EKG showed new onset A. fib with RVR, High-sensitivity troponins negative. Continue Eliquis 5 mg p.o. daily. Continue metoprolol 50 mg IV twice daily Obtain 2D echocardiogram. Cardiology consulted, continue current management.  Prolonged QT interval: Optimize electrolyte. Avoid QT prolonging agents. Repeat EKG in the morning  Bronchitis / CAP: Continue ceftriaxone and doxycycline. Obtain sputum gram stain culture and sensitivity. Check strep pneumo and Legionella urinary antigen.  Reactive airway disease: Continue Xopenex nebs 3 times a day. She has received Solu-Medrol 40 mg IV once.  Sleep apnea  syndrome: Continue CPAP at bedtime  Hypocalcemia: Improved.  Aortic atherosclerosis: Continue atorvastatin 20 mg daily  Glucose intolerance: Carb modified diet, obtain hemoglobin A1c  DVT prophylaxis: Eliquis Code Status: Full code. Family Communication: No family at bed side. Disposition Plan:   Status is: Inpatient  Remains inpatient appropriate because:  Admitted for bronchopneumonia requiring IV antibiotics and A. fib with RVR cardiology consulted.  Consultants:  Cardiology  Procedures:  Echo Antimicrobials:  Anti-infectives (From admission, onward)    Start     Dose/Rate Route Frequency Ordered Stop   06/28/21 2200  doxycycline (VIBRA-TABS) tablet 100 mg        100 mg Oral Every 12 hours 06/28/21 1114 07/02/21 0959   06/27/21 1400  cefTRIAXone (ROCEPHIN) 2 g in sodium chloride 0.9 % 100 mL IVPB        2 g 200 mL/hr over 30 Minutes Intravenous Daily 06/27/21 1335 07/02/21 0959   06/27/21 1400  azithromycin (ZITHROMAX) tablet 500 mg  Status:  Discontinued        500 mg Oral Daily 06/27/21 1335 06/27/21 1341   06/27/21 1400  doxycycline (VIBRAMYCIN) 100 mg in sodium chloride 0.9 % 250 mL IVPB  Status:  Discontinued        100 mg 125 mL/hr over 120 Minutes Intravenous Every 12 hours 06/27/21 1341 06/28/21 1114        Subjective: Patient was seen and examined at bedside.  Overnight events noted.  Patient reports feeling much improved.  He still reports having chest discomfort but denies any pain.  Objective: Vitals:   06/28/21 0751 06/28/21 0755 06/28/21 1004 06/28/21 1106  BP:   132/82   Pulse:   (!) 47 (!) 120  Resp:   16  Temp:   98.1 F (36.7 C)   TempSrc:   Oral   SpO2: 97% 97% 98%   Weight:      Height:        Intake/Output Summary (Last 24 hours) at 06/28/2021 1347 Last data filed at 06/28/2021 1016 Gross per 24 hour  Intake 400 ml  Output --  Net 400 ml   Filed Weights   06/27/21 1144  Weight: 103.9 kg    Examination:  General exam:  Appears comfortable, not in any acute distress. Respiratory system: Clear to auscultation bilaterally,  respiratory effort normal, RR 15. Cardiovascular system: S1 & S2 heard, irregular rhythm, no murmur. Gastrointestinal system: Abdomen is soft, nontender, nondistended, BS+ Central nervous system: Alert and oriented x 3. No focal neurological deficits. Extremities: No edema, no cyanosis, no clubbing. Skin: No rashes, lesions or ulcers Psychiatry: Judgement and insight appear normal. Mood & affect appropriate.     Data Reviewed: I have personally reviewed following labs and imaging studies  CBC: Recent Labs  Lab 06/27/21 1035 06/28/21 0347  WBC 8.5 6.2  NEUTROABS 5.9  --   HGB 15.2 14.9  HCT 43.8 43.0  MCV 94.8 95.1  PLT 232 191   Basic Metabolic Panel: Recent Labs  Lab 06/27/21 1035 06/28/21 0347  NA 134* 137  K 3.6 3.9  CL 101 103  CO2 23 23  GLUCOSE 122* 149*  BUN 12 13  CREATININE 0.79 0.69  CALCIUM 8.7* 8.8*  MG 2.0  --    GFR: Estimated Creatinine Clearance: 91.8 mL/min (by C-G formula based on SCr of 0.69 mg/dL). Liver Function Tests: Recent Labs  Lab 06/27/21 1035 06/28/21 0347  AST 23 23  ALT 14 12  ALKPHOS 40 39  BILITOT 0.7 1.0  PROT 7.6 7.5  ALBUMIN 4.1 4.0   No results for input(s): LIPASE, AMYLASE in the last 168 hours. No results for input(s): AMMONIA in the last 168 hours. Coagulation Profile: No results for input(s): INR, PROTIME in the last 168 hours. Cardiac Enzymes: No results for input(s): CKTOTAL, CKMB, CKMBINDEX, TROPONINI in the last 168 hours. BNP (last 3 results) No results for input(s): PROBNP in the last 8760 hours. HbA1C: Recent Labs    06/28/21 0347  HGBA1C 5.8*   CBG: Recent Labs  Lab 06/27/21 1658 06/27/21 2216 06/28/21 0757 06/28/21 1159  GLUCAP 152* 189* 120* 140*   Lipid Profile: No results for input(s): CHOL, HDL, LDLCALC, TRIG, CHOLHDL, LDLDIRECT in the last 72 hours. Thyroid Function Tests: Recent  Labs    06/27/21 1035  TSH 2.119   Anemia Panel: No results for input(s): VITAMINB12, FOLATE, FERRITIN, TIBC, IRON, RETICCTPCT in the last 72 hours. Sepsis Labs: No results for input(s): PROCALCITON, LATICACIDVEN in the last 168 hours.  No results found for this or any previous visit (from the past 240 hour(s)).   Radiology Studies: DG Chest 2 View  Result Date: 06/27/2021 CLINICAL DATA:  76 year old male with history of shortness of breath for the past 3-4 days. EXAM: CHEST - 2 VIEW COMPARISON:  Chest x-ray 05/05/2012. FINDINGS: Lung volumes are normal. Widespread interstitial prominence, diffuse peribronchial cuffing, and patchy ill-defined opacities in the lung bases bilaterally. No pleural effusions. No pneumothorax. No pulmonary nodule or mass noted. Pulmonary vasculature and the cardiomediastinal silhouette are within normal limits. Atherosclerotic calcifications in the thoracic aorta. IMPRESSION: 1. The appearance of the chest is compatible with severe bronchitis, potentially with developing bilateral lower lobe bronchopneumonia. 2. Aortic atherosclerosis. Electronically Signed   By: Quillian Quince  Entrikin M.D.   On: 06/27/2021 11:16    Scheduled Meds:  apixaban  5 mg Oral BID   atorvastatin  20 mg Oral Daily   doxycycline  100 mg Oral Q12H   hydrochlorothiazide  12.5 mg Oral Daily   levalbuterol  1.25 mg Nebulization TID   metFORMIN  500 mg Oral BID WC   metoprolol tartrate  50 mg Oral BID   Continuous Infusions:  cefTRIAXone (ROCEPHIN)  IV 2 g (06/28/21 0851)     LOS: 0 days    Time spent: 50 mins    Tahjae Clausing, MD [facility]   If 7PM-7AM, please contact night-coverage

## 2021-06-28 NOTE — Progress Notes (Signed)
°   06/28/21 0220  Assess: MEWS Score  Temp 98.1 F (36.7 C)  BP (!) 155/86  Pulse Rate (!) 121  ECG Heart Rate (!) 112  Resp 20  SpO2 98 %  O2 Device Nasal Cannula  Assess: MEWS Score  MEWS Temp 0  MEWS Systolic 0  MEWS Pulse 2  MEWS RR 0  MEWS LOC 0  MEWS Score 2  MEWS Score Color Yellow  Treat  Pain Scale 0-10  Pain Score 0  Take Vital Signs  Increase Vital Sign Frequency  Yellow: Q 2hr X 2 then Q 4hr X 2, if remains yellow, continue Q 4hrs  Escalate  MEWS: Escalate Yellow: discuss with charge nurse/RN and consider discussing with provider and RRT  Notify: Charge Nurse/RN  Name of Charge Nurse/RN Notified Lauren  Date Charge Nurse/RN Notified 06/28/21  Time Charge Nurse/RN Notified 0239  Document  Patient Outcome Other (Comment) (Came with New onset of Afib RVR on meds)  Assess: SIRS CRITERIA  SIRS Temperature  0  SIRS Pulse 1  SIRS Respirations  0  SIRS WBC 0  SIRS Score Sum  1

## 2021-06-28 NOTE — Consult Note (Addendum)
Cardiology Consultation:   Patient ID: Jeffrey Andrews MRN: [id number]; DOB: 10-15-1945  Admit date: 06/27/2021 Date of Consult: 06/28/2021  PCP:  Shirline Frees, MD   [facility] Providers Cardiologist:  None   New  Patient Profile:   Jeffrey Andrews is a 76 y.o. male with a hx of HTN, OSA on CPAP, OA, skin CA, SEM, obesity, glucose intol, Ao atherosclerosis, who is being seen 06/28/2021 for the evaluation of atrial fib, RVR at the request of Dr Dwyane Dee.  History of Present Illness:   Jeffrey Andrews has not seen Cardiology before. He came to the hospital 01/29 with SOB, dx CAP. Was also in rapid Afib >> started on Eliquis, Toprol XL 25 mg qd and prn IV metop. QT measured at 509 ms, given IV MgSO4. Cards asked to see.   Jeffrey Andrews is familiar with the [facility] through his wife.  He started getting sick a week or so ago.  He had an IgG infusion for his immunocompromise (unknown cause) on 24th, and the nurse noted that his heart rate was high.  However, he came down before he left.  He was checking his blood pressure on his home machine, and noted that his heart rate was elevated.  He has no awareness of elevated or irregular heart rate.  He has not been lightheaded or dizzy.  He has not been weak, but has noticed increasing shortness of breath over the last week or so before admitting to the hospital with pneumonia.  He coughed a great deal.  After breathing treatment.  He has had a breathing treatment today, but has not started walking yet.  In general, he is feeling better   Past Medical History:  Diagnosis Date   Arthritis    Cancer ([place]) 2014   skin cancer left arm   Excessive sweating    Glucose intolerance 06/27/2021   Heart murmur    "functional heart murmur"   History of kidney stones    History of skin cancer    Hypertension    Sleep apnea    uses c-pap    Past Surgical History:  Procedure Laterality Date   BACK SURGERY     x3 last back surgery 1984   EYE SURGERY      cataracts with lens implants   STERIOD INJECTION Right 06/08/2015   Procedure: STEROID INJECTION;  Surgeon: Gaynelle Arabian, MD;  Location: [facility];  Service: Orthopedics;  Laterality: Right;   TENDON REPAIR Left 2000   knee   TONSILLECTOMY     TOTAL KNEE ARTHROPLASTY Left 06/08/2015   Procedure: TOTAL KNEE ARTHROPLASTY;  Surgeon: Gaynelle Arabian, MD;  Location: [facility];  Service: Orthopedics;  Laterality: Left;   TOTAL KNEE ARTHROPLASTY Right 11/09/2015   Procedure: RIGHT TOTAL KNEE ARTHROPLASTY;  Surgeon: Gaynelle Arabian, MD;  Location: [facility];  Service: Orthopedics;  Laterality: Right;   WRIST SURGERY  2008   left     Home Medications:  Prior to Admission medications   Medication Sig Start Date End Date Taking? Authorizing Provider  acetaminophen (TYLENOL) 500 MG tablet Take 500 mg by mouth every 6 (six) hours as needed (For pain.).   Yes [provider]  atorvastatin (LIPITOR) 20 MG tablet Take 20 mg by mouth daily.   Yes [provider]  hydrochlorothiazide (HYDRODIURIL) 25 MG tablet Take 12.5 mg by mouth daily.    Yes [provider]  ipratropium (ATROVENT) 0.03 % nasal spray Place 1 spray into both nostrils daily as  needed for rhinitis.   Yes [provider]  loratadine (CLARITIN) 10 MG tablet Take 10 mg by mouth daily as needed for allergies or rhinitis.   Yes [provider]  metFORMIN (GLUCOPHAGE) 500 MG tablet Take 500 mg by mouth 2 (two) times daily with a meal.   Yes [provider]  Multiple Vitamin (MULTI-VITAMIN) tablet Take 1 tablet by mouth daily.   Yes [provider]  Omega-3 Fatty Acids (FISH OIL) 1000 MG CAPS Take 1 capsule by mouth 2 (two) times daily.   Yes [provider]  methocarbamol (ROBAXIN) 500 MG tablet Take 1 tablet (500 mg total) by mouth every 6 (six) hours as needed for muscle spasms. Patient not taking: Reported on 06/27/2021 11/10/15   [place], [employer], PA-C  oxyCODONE (OXY IR/ROXICODONE) 5  MG immediate release tablet Take 1-2 tablets (5-10 mg total) by mouth every 3 (three) hours as needed for breakthrough pain. Patient not taking: Reported on 06/27/2021 11/10/15   [place], [employer], PA-C  rivaroxaban (XARELTO) 10 MG TABS tablet Take 1 tablet (10 mg total) by mouth daily with breakfast. Patient not taking: Reported on 06/27/2021 11/10/15   [place], [employer], PA-C  traMADol (ULTRAM) 50 MG tablet Take 1-2 tablets (50-100 mg total) by mouth every 6 (six) hours as needed for moderate pain. Patient not taking: Reported on 06/27/2021 11/10/15   Sheryle Hail, PA-C    Inpatient Medications: Scheduled Meds:  apixaban  5 mg Oral BID   atorvastatin  20 mg Oral Daily   hydrochlorothiazide  12.5 mg Oral Daily   levalbuterol  1.25 mg Nebulization TID   metFORMIN  500 mg Oral BID WC   metoprolol tartrate  50 mg Oral BID   Continuous Infusions:  cefTRIAXone (ROCEPHIN)  IV 2 g (06/28/21 0851)   doxycycline (VIBRAMYCIN) IV 100 mg (06/27/21 2132)   PRN Meds: acetaminophen **OR** acetaminophen, levalbuterol, loratadine, metoprolol tartrate  Allergies:   No Known Allergies  Social History:   Social History   Socioeconomic History   Marital status: Married    Spouse name: Not on file   Number of children: Not on file   Years of education: Not on file   Highest education level: Not on file  Occupational History   Not on file  Tobacco Use   Smoking status: Never   Smokeless tobacco: Former    Quit date: 10/29/1992  Substance and Sexual Activity   Alcohol use: No   Drug use: No   Sexual activity: Not on file  Other Topics Concern   Not on file  Social History Narrative   Not on file   Social Determinants of Health   Financial Resource Strain: Not on file  Food Insecurity: Not on file  Transportation Needs: Not on file  Physical Activity: Not on file  Stress: Not on file  Social Connections: Not on file  Intimate Partner Violence: Not on file    Family History:    Family History  Problem Relation Age of Onset   Hypertension Mother    Hypertension Father    Diabetes type II Sister    Kidney disease Other    Brain cancer Paternal Uncle    Heart attack Maternal Uncle      ROS:  Please see the history of present illness.  All other ROS reviewed and negative.     Physical Exam/Data:   Vitals:   06/28/21 0539 06/28/21 0751 06/28/21 0755 06/28/21 1004  BP: (!) 125/94   132/82  Pulse: Marland Kitchen)  113   (!) 47  Resp: 20   16  Temp: 98.4 F (36.9 C)   98.1 F (36.7 C)  TempSrc: Oral   Oral  SpO2: 95% 97% 97% 98%  Weight:      Height:        Intake/Output Summary (Last 24 hours) at 06/28/2021 1022 Last data filed at 06/28/2021 1016 Gross per 24 hour  Intake 445.99 ml  Output --  Net 445.99 ml    Last 3 Weights 06/27/2021 11/09/2015 10/30/2015  Weight (lbs) 229 lb 235 lb 235 lb 9.6 oz  Weight (kg) 103.874 kg 106.595 kg 106.867 kg     Body mass index is 34.82 kg/m.  General:  Well nourished, well developed, in no acute distress HEENT: normal Neck: JVD 10 cm Vascular: No carotid bruits; Distal pulses 2+ bilaterally Cardiac:  normal S1, S2; rapid and irregular R&R, no murmur  Lungs:  rales R>L, no wheezing, rhonchi   Abd: soft, nontender, no hepatomegaly  Ext: trace edema Musculoskeletal:  No deformities, BUE and BLE strength normal and equal Skin: warm and dry  Neuro:  CNs 2-12 intact, no focal abnormalities noted Psych:  Normal affect   EKG:  The EKG was personally reviewed and demonstrates:   01/29 ECG is atrial fib, RVR, HR 137, QTc 509 on ECG, 476 by my measurement 01/30 ECG is atrial fib, RVR, HR 118, QTc 454 Telemetry:  Telemetry was personally reviewed and demonstrates:  atrial fib, RVR  Relevant CV Studies:  ECHO: ordered  Laboratory Data:  High Sensitivity Troponin:   Recent Labs  Lab 06/27/21 1035 06/27/21 1425  TROPONINIHS 10 10      Chemistry Recent Labs  Lab 06/27/21 1035 06/28/21 0347  NA 134* 137  K 3.6  3.9  CL 101 103  CO2 23 23  GLUCOSE 122* 149*  BUN 12 13  CREATININE 0.79 0.69  CALCIUM 8.7* 8.8*  MG 2.0  --   GFRNONAA >60 >60  ANIONGAP 10 11     Recent Labs  Lab 06/27/21 1035 06/28/21 0347  PROT 7.6 7.5  ALBUMIN 4.1 4.0  AST 23 23  ALT 14 12  ALKPHOS 40 39  BILITOT 0.7 1.0    Lipids No results for input(s): CHOL, TRIG, HDL, LABVLDL, LDLCALC, CHOLHDL in the last 168 hours.  Hematology Recent Labs  Lab 06/27/21 1035 06/28/21 0347  WBC 8.5 6.2  RBC 4.62 4.52  HGB 15.2 14.9  HCT 43.8 43.0  MCV 94.8 95.1  MCH 32.9 33.0  MCHC 34.7 34.7  RDW 13.3 13.3  PLT 232 249    Thyroid  Recent Labs  Lab 06/27/21 1035  TSH 2.119     BNP Recent Labs  Lab 06/27/21 1035  BNP 232.0*     DDimer No results for input(s): DDIMER in the last 168 hours. Lab Results  Component Value Date   HGBA1C 5.8 (H) 06/28/2021     Radiology/Studies:  DG Chest 2 View  Result Date: 06/27/2021 CLINICAL DATA:  76 year old male with history of shortness of breath for the past 3-4 days. EXAM: CHEST - 2 VIEW COMPARISON:  Chest x-ray 05/05/2012. FINDINGS: Lung volumes are normal. Widespread interstitial prominence, diffuse peribronchial cuffing, and patchy ill-defined opacities in the lung bases bilaterally. No pleural effusions. No pneumothorax. No pulmonary nodule or mass noted. Pulmonary vasculature and the cardiomediastinal silhouette are within normal limits. Atherosclerotic calcifications in the thoracic aorta. IMPRESSION: 1. The appearance of the chest is compatible with severe bronchitis, potentially with developing  bilateral lower lobe bronchopneumonia. 2. Aortic atherosclerosis. Electronically Signed   By: Vinnie Langton M.D.   On: 06/27/2021 11:16     Assessment and Plan:   Atrial fib, RVR -New diagnosis, unknown duration. 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  Bronchitis/bilateral lower pneumonia -Antibiotics management per IM, he is improved now but still requires oxygen via nasal (was not on at home)  3.  Acute combined systolic and diastolic heart failure -EF 35 to 40% - has JVD, trace LE edema -Start IV Lasix 40 mg twice daily  4. HTN -BP is generally elevated from the 120s-150s - Prior to admission he was on HCTZ 12.5 mg daily, will hold with starting Lasix as above.  Start metoprolol as above   Risk Assessment/Risk Scores:        CHA2DS2-VASc Score = 5   This indicates a 7.2% annual risk of stroke. The patient's score is based upon: CHF History: 0 HTN History: 1 Diabetes History: 1 Stroke History: 0 Vascular Disease History: 1 Age Score: 2 Gender Score: 0    For questions or updates, please contact [facility] Please consult [email or web address] for contact info under    Signed, Rosaria Ferries, PA-C  06/28/2021 10:22 AM   Patient seen and examined.  Agree as above documentation.  Jeffrey Andrews is a 76 year old male with a history of OSA, hypertension, obesity who we are consulted by Dr. Dwyane Dee for evaluation of atrial fibrillation.  He presented with shortness of breath to ED on 1/29.  Was found to be in A. fib with RVR.  Initial vital signs notable for BP 158/122, SPO2 90% on room air, pulse 145.  Labs notable for sodium 134, creatinine 0.79, BNP 232, troponin 10 > 10, WBC 8.5, hemoglobin 15.2, platelets 232.  Chest x-ray concerning for pneumonia.  Echocardiogram shows EF 35 to 40%, global hypokinesis, normal RV function, severe left atrial enlargement, moderate MR, RAP 15.  EKG shows atrial fibrillation, rate 137, poor R wave progression, PVCs.  On exam, patient is alert and oriented, irregular, tachycardic, no murmurs, lungs CTAB, 1+ lower extremity  edema, + JVD.  For his atrial fibrillation, metoprolol switched to 50 mg twice daily, can titrate as needed for rate control.  Started Eliquis for anticoagulation.  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  Will plan TEE/DCCV to restore sinus rhythm.  Plan follow-up echocardiogram in 3 months, if remains with systolic dysfunction, will need ischemic evaluation.  For his acute on chronic combined systolic and diastolic heart failure, he appears hypervolemic on exam, will start IV Lasix.  Donato Heinz, MD

## 2021-06-28 NOTE — Progress Notes (Signed)
°  Echocardiogram 2D Echocardiogram has been performed.  Oneal Deputy Bryant Saye RDCS 06/28/2021, 11:40 AM

## 2021-06-28 NOTE — Progress Notes (Addendum)
error 

## 2021-06-29 LAB — RENAL FUNCTION PANEL
Albumin: 4.2 g/dL (ref 3.5–5.0)
Anion gap: 10 (ref 5–15)
BUN: 21 mg/dL (ref 8–23)
CO2: 29 mmol/L (ref 22–32)
Calcium: 9.2 mg/dL (ref 8.9–10.3)
Chloride: 96 mmol/L — ABNORMAL LOW (ref 98–111)
Creatinine, Ser: 0.85 mg/dL (ref 0.61–1.24)
GFR, Estimated: >60 mL/min (ref >60)
Glucose, Bld: 131 mg/dL — ABNORMAL HIGH (ref 70–99)
Phosphorus: 3.8 mg/dL (ref 2.5–4.6)
Potassium: 3.8 mmol/L (ref 3.5–5.1)
Sodium: 135 mmol/L (ref 135–145)

## 2021-06-29 LAB — GLUCOSE, CAPILLARY
Glucose-Capillary: 108 mg/dL — ABNORMAL HIGH (ref 70–99)
Glucose-Capillary: 90 mg/dL (ref 70–99)
Glucose-Capillary: 94 mg/dL (ref 70–99)
Glucose-Capillary: 118 mg/dL — ABNORMAL HIGH (ref 70–99)

## 2021-06-29 NOTE — Progress Notes (Addendum)
Progress Note  Patient Name: Jeffrey Andrews Date of Encounter: 06/29/2021  [facility] Cardiologist: Donato Heinz, MD   Subjective   Patient reports having chest pain that is located across the bottom of his chest, worse with coughing. Denies any specific left sided chest pain. Breathing has improved, currently on room air. Denies any palpitations, dizziness.   Inpatient Medications    Scheduled Meds:  apixaban  5 mg Oral BID   atorvastatin  20 mg Oral Daily   doxycycline  100 mg Oral Q12H   furosemide  40 mg Intravenous BID   levalbuterol  1.25 mg Nebulization TID   metFORMIN  500 mg Oral BID WC   metoprolol tartrate  50 mg Oral BID   Continuous Infusions:  cefTRIAXone (ROCEPHIN)  IV 2 g (06/28/21 0851)   PRN Meds: acetaminophen **OR** acetaminophen, levalbuterol, loratadine, metoprolol tartrate   Vital Signs    Vitals:   06/28/21 2027 06/28/21 2044 06/29/21 0603 06/29/21 0903  BP:  136/79 134/87   Pulse:  (!) 106 89   Resp:  20 19   Temp:  97.8 F (36.6 C) 98.2 F (36.8 C)   TempSrc:  Oral Oral   SpO2: 94% 98% 98% 94%  Weight:      Height:        Intake/Output Summary (Last 24 hours) at 06/29/2021 0915 Last data filed at 06/29/2021 0750 Gross per 24 hour  Intake 640 ml  Output --  Net 640 ml   Last 3 Weights 06/27/2021 11/09/2015 10/30/2015  Weight (lbs) 229 lb 235 lb 235 lb 9.6 oz  Weight (kg) 103.874 kg 106.595 kg 106.867 kg      Telemetry    Afib, heart rate in 90s-100s - Personally Reviewed  ECG    No new tracings - Personally Reviewed  Physical Exam   GEN: No acute distress.   Neck: JVD to midneck  Cardiac: Irregular R&R, no murmurs, rubs, or gallops.  Respiratory: Mild rales in Bilateral lung bases (right worse than left), no wheezing or rhonchi  GI: Soft, nontender, non-distended  MS: Trace edmea in BLE, No deformity. Neuro:  Nonfocal  Psych: Normal affect   Labs    High Sensitivity Troponin:   Recent Labs  Lab  06/27/21 1035 06/27/21 1425  TROPONINIHS 10 10     Chemistry Recent Labs  Lab 06/27/21 1035 06/28/21 0347 06/29/21 0759  NA 134* 137 135  K 3.6 3.9 3.8  CL 101 103 96*  CO2 23 23 29   GLUCOSE 122* 149* 131*  BUN 12 13 21   CREATININE 0.79 0.69 0.85  CALCIUM 8.7* 8.8* 9.2  MG 2.0  --   --   PROT 7.6 7.5  --   ALBUMIN 4.1 4.0 4.2  AST 23 23  --   ALT 14 12  --   ALKPHOS 40 39  --   BILITOT 0.7 1.0  --   GFRNONAA >60 >60 >60  ANIONGAP 10 11 10     Lipids No results for input(s): CHOL, TRIG, HDL, LABVLDL, LDLCALC, CHOLHDL in the last 168 hours.  Hematology Recent Labs  Lab 06/27/21 1035 06/28/21 0347  WBC 8.5 6.2  RBC 4.62 4.52  HGB 15.2 14.9  HCT 43.8 43.0  MCV 94.8 95.1  MCH 32.9 33.0  MCHC 34.7 34.7  RDW 13.3 13.3  PLT 232 249   Thyroid  Recent Labs  Lab 06/27/21 1035  TSH 2.119    BNP Recent Labs  Lab 06/27/21 1035  BNP  232.0*    DDimer No results for input(s): DDIMER in the last 168 hours.   Radiology    DG Chest 2 View  Result Date: 06/27/2021 CLINICAL DATA:  76 year old male with history of shortness of breath for the past 3-4 days. EXAM: CHEST - 2 VIEW COMPARISON:  Chest x-ray 05/05/2012. FINDINGS: Lung volumes are normal. Widespread interstitial prominence, diffuse peribronchial cuffing, and patchy ill-defined opacities in the lung bases bilaterally. No pleural effusions. No pneumothorax. No pulmonary nodule or mass noted. Pulmonary vasculature and the cardiomediastinal silhouette are within normal limits. Atherosclerotic calcifications in the thoracic aorta. IMPRESSION: 1. The appearance of the chest is compatible with severe bronchitis, potentially with developing bilateral lower lobe bronchopneumonia. 2. Aortic atherosclerosis. Electronically Signed   By: Vinnie Langton M.D.   On: 06/27/2021 11:16   ECHOCARDIOGRAM COMPLETE  Result Date: 06/28/2021    ECHOCARDIOGRAM REPORT   Patient Name:   FIN HUPP Date of Exam: 06/28/2021 Medical Rec #:   [id number]     Height:       68.0 in Accession #:    [id number]    Weight:       229.0 lb Date of Birth:  Oct 30, 1945     BSA:          2.165 m Patient Age:    76 years      BP:           132/82 mmHg Patient Gender: M             HR:           63 bpm. Exam Location:  Inpatient Procedure: 2D Echo, Color Doppler and Cardiac Doppler Indications:    I48.91* Unspecified atrial fibrillation  History:        Patient has no prior history of Echocardiogram examinations.                 Arrythmias:Atrial Fibrillation; Risk Factors:Hypertension,                 Dyslipidemia and Sleep Apnea.  Sonographer:    Raquel Sarna Senior RDCS Referring Phys: [id number] [place]  1. Left ventricular ejection fraction, by estimation, is 35 to 40%. The left ventricle has moderately decreased function. The left ventricle demonstrates global hypokinesis. There is mild asymmetric left ventricular hypertrophy of the posterior wall segment. Left ventricular diastolic parameters are indeterminate.  2. Right ventricular systolic function is normal. The right ventricular size is normal. Tricuspid regurgitation signal is inadequate for assessing PA pressure.  3. Left atrial size was severely dilated.  4. The mitral valve is normal in structure. Moderate mitral valve regurgitation. No evidence of mitral stenosis.  5. The aortic valve is normal in structure. Aortic valve regurgitation is not visualized. No aortic stenosis is present.  6. There is mild dilatation of the aortic root, measuring 37 mm. There is mild dilatation of the ascending aorta, measuring 37 mm.  7. The inferior vena cava is dilated in size with <50% respiratory variability, suggesting right atrial pressure of 15 mmHg. Comparison(s): No prior Echocardiogram. FINDINGS  Left Ventricle: Left ventricular ejection fraction, by estimation, is 35 to 40%. The left ventricle has moderately decreased function. The left ventricle demonstrates global hypokinesis. The left ventricular  internal cavity size was normal in size. There is mild asymmetric left ventricular hypertrophy of the posterior wall segment. Left ventricular diastolic parameters are indeterminate. Right Ventricle: The right ventricular size is normal. No increase in right ventricular wall thickness. Right  ventricular systolic function is normal. Tricuspid regurgitation signal is inadequate for assessing PA pressure. Left Atrium: Left atrial size was severely dilated. Right Atrium: Right atrial size was normal in size. Pericardium: There is no evidence of pericardial effusion. Mitral Valve: The mitral valve is normal in structure. Moderate mitral valve regurgitation. No evidence of mitral valve stenosis. Tricuspid Valve: The tricuspid valve is normal in structure. Tricuspid valve regurgitation is not demonstrated. No evidence of tricuspid stenosis. Aortic Valve: The aortic valve is normal in structure. Aortic valve regurgitation is not visualized. No aortic stenosis is present. Pulmonic Valve: The pulmonic valve was not well visualized. Pulmonic valve regurgitation is not visualized. No evidence of pulmonic stenosis. Aorta: The aortic root is normal in size and structure. There is mild dilatation of the aortic root, measuring 37 mm. There is mild dilatation of the ascending aorta, measuring 37 mm. Venous: The inferior vena cava is dilated in size with less than 50% respiratory variability, suggesting right atrial pressure of 15 mmHg. IAS/Shunts: No atrial level shunt detected by color flow Doppler.  LEFT VENTRICLE PLAX 2D LVIDd:         5.40 cm LVIDs:         4.50 cm LV PW:         1.10 cm LV IVS:        0.80 cm LVOT diam:     2.30 cm LV SV:         51 LV SV Index:   24 LVOT Area:     4.15 cm  LV Volumes (MOD) LV vol d, MOD A2C: 135.0 ml LV vol d, MOD A4C: 135.0 ml LV vol s, MOD A2C: 84.0 ml LV vol s, MOD A4C: 92.6 ml LV SV MOD A2C:     51.0 ml LV SV MOD A4C:     135.0 ml LV SV MOD BP:      49.0 ml RIGHT VENTRICLE RV S prime:      9.68 cm/s TAPSE (M-mode): 1.5 cm LEFT ATRIUM              Index        RIGHT ATRIUM           Index LA diam:        5.00 cm  2.31 cm/m   RA Area:     13.50 cm LA Vol (A2C):   107.0 ml 49.43 ml/m  RA Volume:   32.10 ml  14.83 ml/m LA Vol (A4C):   99.2 ml  45.82 ml/m LA Biplane Vol: 111.0 ml 51.27 ml/m  AORTIC VALVE LVOT Vmax:   76.05 cm/s LVOT Vmean:  54.750 cm/s LVOT VTI:    0.123 m  AORTA Ao Root diam: 3.70 cm Ao Asc diam:  3.70 cm  SHUNTS Systemic VTI:  0.12 m Systemic Diam: 2.30 cm Kardie Tobb DO Electronically signed by Berniece Salines DO Signature Date/Time: 06/28/2021/1:50:13 PM    Final     Cardiac Studies   Echo 06/28/2021  1. Left ventricular ejection fraction, by estimation, is 35 to 40%. The  left ventricle has moderately decreased function. The left ventricle  demonstrates global hypokinesis. There is mild asymmetric left ventricular  hypertrophy of the posterior wall  segment. Left ventricular diastolic parameters are indeterminate.   2. Right ventricular systolic function is normal. The right ventricular  size is normal. Tricuspid regurgitation signal is inadequate for assessing  PA pressure.   3. Left atrial size was severely dilated.   4. The mitral valve  is normal in structure. Moderate mitral valve  regurgitation. No evidence of mitral stenosis.   5. The aortic valve is normal in structure. Aortic valve regurgitation is  not visualized. No aortic stenosis is present.   6. There is mild dilatation of the aortic root, measuring 37 mm. There is  mild dilatation of the ascending aorta, measuring 37 mm.   7. The inferior vena cava is dilated in size with <50% respiratory  variability, suggesting right atrial pressure of 15 mmHg.   Patient Profile     76 y.o. male with a hx of HTN, OSA on CPAP, OA, skin CA, SEM, obesity, glucose intol, Ao atherosclerosis, who is being seen 06/28/2021 for the evaluation of atrial fib, RVR at the request of Dr Dwyane Dee.  Assessment & Plan    Atrial  fib, RVR -New diagnosis, unknown duration. -he has been started on Eliquis 5 mg twice daily -He was started on Toprol-XL 25 mg daily, but heart rate was still elevated -Changed metoprolol to short-acting for easier titration and increased to 50 mg twice daily, first dose given yesterday  -- Per telemetry, patient remains in afib with HR in 90s-100s. 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 Patient is on room air and O2 sats are stable (94% this am)  -- OK to schedule TEE/DCCV. Patient will receive his 5th dose of eliquis this evening. Ideally TEE/DCCV would be completed while inpatient as HR is difficult to control    Bronchitis/bilateral lower pneumonia -Antibiotics management per IM, he is improved now but still requires oxygen via nasal (was not on at home)   Acute combined systolic and diastolic heart failure -EF 35 to 40% - has JVD, trace LE edema -On IV Lasix 40 mg twice daily - I/Os, weights not complete. Patient reports that he has been having good urine output. Patient reports that his dry weight is 225, was 229 in the ED.    HTN -BP is generally elevated from the 120s-150s - Prior to admission he was on HCTZ 12.5 mg daily, will hold as patient is on Lasix as above, metoprolol adjustments as above. BP is tolerating medication adjustments well       For questions or updates, please contact [place] Please consult [email or web address] for contact info under        Signed, Margie Billet, PA-C  06/29/2021, 9:15 AM     Patient seen and examined.  Agree with above documentation.  On exam, patient is alert and oriented, irregular, normal rate, no murmurs, lungs CTAB, trace LE edema, +JVD.  I/os not recorded.  Weight not recorded.  Need strict I's and O's and daily weights while diuresing.  Creatinine stable at 0.85.  He reports dyspnea improved.   Continue IV Lasix.  Telemetry shows A. fib with rates 80s to 120s.  Continue metoprolol 50 mg twice daily planning TEE/DCCV.  Donato Heinz, MD

## 2021-06-29 NOTE — Progress Notes (Signed)
PROGRESS NOTE    GLENNON Andrews  ZHG:[id number] DOB: 27-Dec-1945 DOA: 06/27/2021 PCP: Shirline Frees, MD    Brief Narrative:  This 76 years old male with PMH significant for osteoarthritis, skin cancer of the left arm, glucose intolerance, history of functional heart murmur, hypertension, sleep apnea on CPAP, class I obesity, with BMI 34.8, presented in the ED with c/o: Progressively shortness of breath associated with postnasal drip, dry cough which is occasionally productive,  wheezing and fatigue.  Patient also reports chest pressure that bothers him more with inspiration.  He was found to have atrial fibrillation on EKG with RVR.  Patient was given Cardizem x 2, metoprolol.  Chest x-ray shows findings concerning for developing bronchopneumonia.   Patient is admitted for community-acquired pneumonia and atrial fibrillation with RVR.  Cardiology consulted, started on antibiotics.  TTE shows LVEF 35 to 40%.  Cardiology recommended Patient should have TEE/DCCV while inpatient.  Assessment & Plan:   Principal Problem:   Atrial fibrillation with RVR (HCC) Active Problems:   Sleep apnea syndrome   Bronchial pneumonia   Reactive airway disease   Hypocalcemia   Aortic atherosclerosis (HCC)   Hyperlipidemia   Glucose intolerance   Prolonged QT interval   Acute combined systolic and diastolic heart failure (HCC)   Essential hypertension  New onset atrial fibrillation with RVR: Patient presented with chest pain, shortness of breath on exertion. EKG showed new onset A. fib with RVR, High-sensitivity troponins negative. Continue Eliquis 5 mg p.o. daily. Continue metoprolol 50 mg IV twice daily Heart rate is reasonably controlled with intermittent going up to 120s. TTE showed LVEF 35 to 40%. Cardiology consulted, recommended TEE/DCCV.  Prolonged QT interval: Optimize electrolyte. Avoid QT prolonging agents. Repeat EKG shows normal QTC.  Bronchitis / CAP: Continue ceftriaxone and  doxycycline. Obtain sputum gram stain culture and sensitivity. Check strep pneumo and Legionella urinary antigen.  Reactive airway disease: Continue Xopenex nebs 3 times a day. She has received Solu-Medrol 40 mg IV once.  Sleep apnea syndrome: Continue CPAP at bedtime  Hypocalcemia: Improved.  Aortic atherosclerosis: Continue atorvastatin 20 mg daily  Glucose intolerance: Carb modified diet, hemoglobin A1c 5.8  DVT prophylaxis: Eliquis Code Status: Full code. Family Communication: No family at bed side. Disposition Plan:   Status is: Inpatient  Remains inpatient appropriate because:  Admitted for bronchopneumonia requiring IV antibiotics and A. fib with RVR cardiology consulted.  Scheduled to have a TEE/DCCV for atrial fibrillation  Consultants:  Cardiology  Procedures:  Echo Antimicrobials:  Anti-infectives (From admission, onward)    Start     Dose/Rate Route Frequency Ordered Stop   06/28/21 2200  doxycycline (VIBRA-TABS) tablet 100 mg        100 mg Oral Every 12 hours 06/28/21 1114 07/02/21 0959   06/27/21 1400  cefTRIAXone (ROCEPHIN) 2 g in sodium chloride 0.9 % 100 mL IVPB        2 g 200 mL/hr over 30 Minutes Intravenous Daily 06/27/21 1335 07/02/21 0959   06/27/21 1400  azithromycin (ZITHROMAX) tablet 500 mg  Status:  Discontinued        500 mg Oral Daily 06/27/21 1335 06/27/21 1341   06/27/21 1400  doxycycline (VIBRAMYCIN) 100 mg in sodium chloride 0.9 % 250 mL IVPB  Status:  Discontinued        100 mg 125 mL/hr over 120 Minutes Intravenous Every 12 hours 06/27/21 1341 06/28/21 1114        Subjective: Patient was seen and examined at bedside.  Overnight  events noted.   Patient reports feeling much improved.  He still reports having chest discomfort but denies any pain or shortness of breath.    Objective: Vitals:   06/28/21 2044 06/29/21 0603 06/29/21 0903 06/29/21 0930  BP: 136/79 134/87  (!) 143/90  Pulse: (!) 106 89  98  Resp: 20 19  18   Temp:  97.8 F (36.6 C) 98.2 F (36.8 C)  98.4 F (36.9 C)  TempSrc: Oral Oral  Oral  SpO2: 98% 98% 94% 99%  Weight:      Height:        Intake/Output Summary (Last 24 hours) at 06/29/2021 1214 Last data filed at 06/29/2021 0750 Gross per 24 hour  Intake 360 ml  Output --  Net 360 ml   Filed Weights   06/27/21 1144  Weight: 103.9 kg    Examination:  General exam: Appears comfortable, not in any acute distress. Respiratory system: Clear to auscultation bilaterally,  respiratory effort normal, RR 14. Cardiovascular system: S1 & S2 heard, irregular rhythm, no murmur. Gastrointestinal system: Abdomen is soft, nontender, nondistended, BS+ Central nervous system: Alert and oriented x 3. No focal neurological deficits. Extremities: No edema, no cyanosis, no clubbing. Skin: No rashes, lesions or ulcers Psychiatry: Judgement and insight appear normal. Mood & affect appropriate.     Data Reviewed: I have personally reviewed following labs and imaging studies  CBC: Recent Labs  Lab 06/27/21 1035 06/28/21 0347  WBC 8.5 6.2  NEUTROABS 5.9  --   HGB 15.2 14.9  HCT 43.8 43.0  MCV 94.8 95.1  PLT 232 993   Basic Metabolic Panel: Recent Labs  Lab 06/27/21 1035 06/28/21 0347 06/29/21 0759  NA 134* 137 135  K 3.6 3.9 3.8  CL 101 103 96*  CO2 23 23 29   GLUCOSE 122* 149* 131*  BUN 12 13 21   CREATININE 0.79 0.69 0.85  CALCIUM 8.7* 8.8* 9.2  MG 2.0  --   --   PHOS  --   --  3.8   GFR: Estimated Creatinine Clearance: 86.4 mL/min (by C-G formula based on SCr of 0.85 mg/dL). Liver Function Tests: Recent Labs  Lab 06/27/21 1035 06/28/21 0347 06/29/21 0759  AST 23 23  --   ALT 14 12  --   ALKPHOS 40 39  --   BILITOT 0.7 1.0  --   PROT 7.6 7.5  --   ALBUMIN 4.1 4.0 4.2   No results for input(s): LIPASE, AMYLASE in the last 168 hours. No results for input(s): AMMONIA in the last 168 hours. Coagulation Profile: No results for input(s): INR, PROTIME in the last 168  hours. Cardiac Enzymes: No results for input(s): CKTOTAL, CKMB, CKMBINDEX, TROPONINI in the last 168 hours. BNP (last 3 results) No results for input(s): PROBNP in the last 8760 hours. HbA1C: Recent Labs    06/28/21 0347  HGBA1C 5.8*   CBG: Recent Labs  Lab 06/28/21 1159 06/28/21 1652 06/28/21 2045 06/29/21 0737 06/29/21 1117  GLUCAP 140* 101* 143* 108* 90   Lipid Profile: No results for input(s): CHOL, HDL, LDLCALC, TRIG, CHOLHDL, LDLDIRECT in the last 72 hours. Thyroid Function Tests: Recent Labs    06/27/21 1035  TSH 2.119   Anemia Panel: No results for input(s): VITAMINB12, FOLATE, FERRITIN, TIBC, IRON, RETICCTPCT in the last 72 hours. Sepsis Labs: No results for input(s): PROCALCITON, LATICACIDVEN in the last 168 hours.  No results found for this or any previous visit (from the past 240 hour(s)).   Radiology Studies:  ECHOCARDIOGRAM COMPLETE  Result Date: 06/28/2021    ECHOCARDIOGRAM REPORT   Patient Name:   Jeffrey Andrews Date of Exam: 06/28/2021 Medical Rec #:  [id number]     Height:       68.0 in Accession #:    [id number]    Weight:       229.0 lb Date of Birth:  1945/09/24     BSA:          2.165 m Patient Age:    79 years      BP:           132/82 mmHg Patient Gender: M             HR:           63 bpm. Exam Location:  Inpatient Procedure: 2D Echo, Color Doppler and Cardiac Doppler Indications:    I48.91* Unspecified atrial fibrillation  History:        Patient has no prior history of Echocardiogram examinations.                 Arrythmias:Atrial Fibrillation; Risk Factors:Hypertension,                 Dyslipidemia and Sleep Apnea.  Sonographer:    Raquel Sarna Senior RDCS Referring Phys: [id number] [place]  1. Left ventricular ejection fraction, by estimation, is 35 to 40%. The left ventricle has moderately decreased function. The left ventricle demonstrates global hypokinesis. There is mild asymmetric left ventricular hypertrophy of the posterior wall  segment. Left ventricular diastolic parameters are indeterminate.  2. Right ventricular systolic function is normal. The right ventricular size is normal. Tricuspid regurgitation signal is inadequate for assessing PA pressure.  3. Left atrial size was severely dilated.  4. The mitral valve is normal in structure. Moderate mitral valve regurgitation. No evidence of mitral stenosis.  5. The aortic valve is normal in structure. Aortic valve regurgitation is not visualized. No aortic stenosis is present.  6. There is mild dilatation of the aortic root, measuring 37 mm. There is mild dilatation of the ascending aorta, measuring 37 mm.  7. The inferior vena cava is dilated in size with <50% respiratory variability, suggesting right atrial pressure of 15 mmHg. Comparison(s): No prior Echocardiogram. FINDINGS  Left Ventricle: Left ventricular ejection fraction, by estimation, is 35 to 40%. The left ventricle has moderately decreased function. The left ventricle demonstrates global hypokinesis. The left ventricular internal cavity size was normal in size. There is mild asymmetric left ventricular hypertrophy of the posterior wall segment. Left ventricular diastolic parameters are indeterminate. Right Ventricle: The right ventricular size is normal. No increase in right ventricular wall thickness. Right ventricular systolic function is normal. Tricuspid regurgitation signal is inadequate for assessing PA pressure. Left Atrium: Left atrial size was severely dilated. Right Atrium: Right atrial size was normal in size. Pericardium: There is no evidence of pericardial effusion. Mitral Valve: The mitral valve is normal in structure. Moderate mitral valve regurgitation. No evidence of mitral valve stenosis. Tricuspid Valve: The tricuspid valve is normal in structure. Tricuspid valve regurgitation is not demonstrated. No evidence of tricuspid stenosis. Aortic Valve: The aortic valve is normal in structure. Aortic valve regurgitation  is not visualized. No aortic stenosis is present. Pulmonic Valve: The pulmonic valve was not well visualized. Pulmonic valve regurgitation is not visualized. No evidence of pulmonic stenosis. Aorta: The aortic root is normal in size and structure. There is mild dilatation of the aortic root, measuring 37 mm. There  is mild dilatation of the ascending aorta, measuring 37 mm. Venous: The inferior vena cava is dilated in size with less than 50% respiratory variability, suggesting right atrial pressure of 15 mmHg. IAS/Shunts: No atrial level shunt detected by color flow Doppler.  LEFT VENTRICLE PLAX 2D LVIDd:         5.40 cm LVIDs:         4.50 cm LV PW:         1.10 cm LV IVS:        0.80 cm LVOT diam:     2.30 cm LV SV:         51 LV SV Index:   24 LVOT Area:     4.15 cm  LV Volumes (MOD) LV vol d, MOD A2C: 135.0 ml LV vol d, MOD A4C: 135.0 ml LV vol s, MOD A2C: 84.0 ml LV vol s, MOD A4C: 92.6 ml LV SV MOD A2C:     51.0 ml LV SV MOD A4C:     135.0 ml LV SV MOD BP:      49.0 ml RIGHT VENTRICLE RV S prime:     9.68 cm/s TAPSE (M-mode): 1.5 cm LEFT ATRIUM              Index        RIGHT ATRIUM           Index LA diam:        5.00 cm  2.31 cm/m   RA Area:     13.50 cm LA Vol (A2C):   107.0 ml 49.43 ml/m  RA Volume:   32.10 ml  14.83 ml/m LA Vol (A4C):   99.2 ml  45.82 ml/m LA Biplane Vol: 111.0 ml 51.27 ml/m  AORTIC VALVE LVOT Vmax:   76.05 cm/s LVOT Vmean:  54.750 cm/s LVOT VTI:    0.123 m  AORTA Ao Root diam: 3.70 cm Ao Asc diam:  3.70 cm  SHUNTS Systemic VTI:  0.12 m Systemic Diam: 2.30 cm Kardie Tobb DO Electronically signed by Berniece Salines DO Signature Date/Time: 06/28/2021/1:50:13 PM    Final     Scheduled Meds:  apixaban  5 mg Oral BID   atorvastatin  20 mg Oral Daily   doxycycline  100 mg Oral Q12H   furosemide  40 mg Intravenous BID   levalbuterol  1.25 mg Nebulization TID   metFORMIN  500 mg Oral BID WC   metoprolol tartrate  50 mg Oral BID   Continuous Infusions:  cefTRIAXone (ROCEPHIN)  IV 2 g  (06/29/21 1001)     LOS: 1 day    Time spent: 35 mins    Betty Brooks, MD [facility]   If 7PM-7AM, please contact night-coverage

## 2021-06-29 NOTE — Progress Notes (Signed)
Pt refusing our cpap for the night,will just use [place].

## 2021-06-30 LAB — RENAL FUNCTION PANEL
Albumin: 3.7 g/dL (ref 3.5–5.0)
Anion gap: 11 (ref 5–15)
BUN: 22 mg/dL (ref 8–23)
CO2: 26 mmol/L (ref 22–32)
Calcium: 9 mg/dL (ref 8.9–10.3)
Chloride: 99 mmol/L (ref 98–111)
Creatinine, Ser: 0.73 mg/dL (ref 0.61–1.24)
GFR, Estimated: >60 mL/min (ref >60)
Glucose, Bld: 135 mg/dL — ABNORMAL HIGH (ref 70–99)
Phosphorus: 4.5 mg/dL (ref 2.5–4.6)
Potassium: 3.2 mmol/L — ABNORMAL LOW (ref 3.5–5.1)
Sodium: 136 mmol/L (ref 135–145)

## 2021-06-30 LAB — RESP PANEL BY RT-PCR (FLU A&B, COVID) ARPGX2
Influenza A by PCR: NEGATIVE
Influenza B by PCR: NEGATIVE
SARS Coronavirus 2 by RT PCR: NEGATIVE

## 2021-06-30 LAB — MAGNESIUM: Magnesium: 2 mg/dL (ref 1.7–2.4)

## 2021-06-30 LAB — GLUCOSE, CAPILLARY
Glucose-Capillary: 120 mg/dL — ABNORMAL HIGH (ref 70–99)
Glucose-Capillary: 107 mg/dL — ABNORMAL HIGH (ref 70–99)
Glucose-Capillary: 186 mg/dL — ABNORMAL HIGH (ref 70–99)
Glucose-Capillary: 127 mg/dL — ABNORMAL HIGH (ref 70–99)

## 2021-06-30 MED ORDER — LEVALBUTEROL HCL 1.25 MG/0.5ML IN NEBU
1.2500 mg | INHALATION_SOLUTION | Freq: Two times a day (BID) | RESPIRATORY_TRACT | Status: DC
  Administered 2021-07-01 – 2021-07-04 (×6): 1.25 mg via RESPIRATORY_TRACT
  Filled 2021-06-30 (×6): qty 0.5

## 2021-06-30 MED ORDER — POTASSIUM CHLORIDE 20 MEQ PO PACK
40.0000 meq | PACK | Freq: Once | ORAL | Status: DC
  Filled 2021-06-30: qty 2

## 2021-06-30 MED ORDER — SODIUM CHLORIDE 0.9 % IV SOLN: INTRAVENOUS | Status: DC

## 2021-06-30 MED ORDER — POTASSIUM CHLORIDE 20 MEQ PO PACK
40.0000 meq | PACK | ORAL | Status: AC
  Administered 2021-06-30 (×2): 40 meq via ORAL
  Filled 2021-06-30: qty 2

## 2021-06-30 NOTE — Plan of Care (Signed)

## 2021-06-30 NOTE — Progress Notes (Signed)
Progress Note  Patient Name: Jeffrey Andrews Date of Encounter: 06/30/2021  [facility] Cardiologist: Donato Heinz, MD   Subjective   Net -1.5 L yesterday.  BP 140/76 this morning.  Weight is down 11 pounds from admission.  Creatinine stable at 0.7.  Reports dyspnea improving but continues to have some shortness of breath  Inpatient Medications    Scheduled Meds:  apixaban  5 mg Oral BID   atorvastatin  20 mg Oral Daily   doxycycline  100 mg Oral Q12H   furosemide  40 mg Intravenous BID   levalbuterol  1.25 mg Nebulization TID   metFORMIN  500 mg Oral BID WC   metoprolol tartrate  50 mg Oral BID   potassium chloride  40 mEq Oral Q4H   Continuous Infusions:  cefTRIAXone (ROCEPHIN)  IV 2 g (06/30/21 0856)   PRN Meds: acetaminophen **OR** acetaminophen, levalbuterol, loratadine, metoprolol tartrate   Vital Signs    Vitals:   06/29/21 2000 06/30/21 0500 06/30/21 0532 06/30/21 0817  BP: (!) 155/115  140/76   Pulse: 80  97   Resp: 20     Temp: 98.1 F (36.7 C)  (!) 97.5 F (36.4 C)   TempSrc: Oral  Oral   SpO2: 95%  96% 95%  Weight:  99.1 kg    Height:        Intake/Output Summary (Last 24 hours) at 06/30/2021 1208 Last data filed at 06/30/2021 0800 Gross per 24 hour  Intake 460 ml  Output 2650 ml  Net -2190 ml    Last 3 Weights 06/30/2021 06/27/2021 11/09/2015  Weight (lbs) 218 lb 7.6 oz 229 lb 235 lb  Weight (kg) 99.1 kg 103.874 kg 106.595 kg      Telemetry    Afib, heart rate in 90s-120s - Personally Reviewed  ECG    No new tracings - Personally Reviewed  Physical Exam   GEN: No acute distress.   Neck: +JVD  Cardiac: Irregular R&R, no murmurs, rubs, or gallops.  Respiratory: CTAB GI: Soft, nontender, non-distended  MS: Trace edema in BLE, No deformity. Neuro:  Nonfocal  Psych: Normal affect   Labs    High Sensitivity Troponin:   Recent Labs  Lab 06/27/21 1035 06/27/21 1425  TROPONINIHS 10 10      Chemistry Recent Labs  Lab  06/27/21 1035 06/28/21 0347 06/29/21 0759 06/30/21 0351  NA 134* 137 135 136  K 3.6 3.9 3.8 3.2*  CL 101 103 96* 99  CO2 23 23 29 26   GLUCOSE 122* 149* 131* 135*  BUN 12 13 21 22   CREATININE 0.79 0.69 0.85 0.73  CALCIUM 8.7* 8.8* 9.2 9.0  MG 2.0  --   --  2.0  PROT 7.6 7.5  --   --   ALBUMIN 4.1 4.0 4.2 3.7  AST 23 23  --   --   ALT 14 12  --   --   ALKPHOS 40 39  --   --   BILITOT 0.7 1.0  --   --   GFRNONAA >60 >60 >60 >60  ANIONGAP 10 11 10 11      Lipids No results for input(s): CHOL, TRIG, HDL, LABVLDL, LDLCALC, CHOLHDL in the last 168 hours.  Hematology Recent Labs  Lab 06/27/21 1035 06/28/21 0347  WBC 8.5 6.2  RBC 4.62 4.52  HGB 15.2 14.9  HCT 43.8 43.0  MCV 94.8 95.1  MCH 32.9 33.0  MCHC 34.7 34.7  RDW 13.3 13.3  PLT 232  249    Thyroid  Recent Labs  Lab 06/27/21 1035  TSH 2.119     BNP Recent Labs  Lab 06/27/21 1035  BNP 232.0*     DDimer No results for input(s): DDIMER in the last 168 hours.   Radiology    No results found.  Cardiac Studies   Echo 06/28/2021  1. Left ventricular ejection fraction, by estimation, is 35 to 40%. The  left ventricle has moderately decreased function. The left ventricle  demonstrates global hypokinesis. There is mild asymmetric left ventricular  hypertrophy of the posterior wall  segment. Left ventricular diastolic parameters are indeterminate.   2. Right ventricular systolic function is normal. The right ventricular  size is normal. Tricuspid regurgitation signal is inadequate for assessing  PA pressure.   3. Left atrial size was severely dilated.   4. The mitral valve is normal in structure. Moderate mitral valve  regurgitation. No evidence of mitral stenosis.   5. The aortic valve is normal in structure. Aortic valve regurgitation is  not visualized. No aortic stenosis is present.   6. There is mild dilatation of the aortic root, measuring 37 mm. There is  mild dilatation of the ascending aorta,  measuring 37 mm.   7. The inferior vena cava is dilated in size with <50% respiratory  variability, suggesting right atrial pressure of 15 mmHg.   Patient Profile     76 y.o. male with a hx of HTN, OSA on CPAP, OA, skin CA, SEM, obesity, glucose intol, Ao atherosclerosis, who is being seen 06/28/2021 for the evaluation of atrial fib, RVR at the request of Dr Dwyane Dee.  Assessment & Plan    Atrial fib, RVR -New diagnosis, unknown duration. -he has been started on Eliquis 5 mg twice daily - Continue metoprolol 50 mg twice daily -- Per telemetry, patient remains in afib with HR in 90s-100s. 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  Scheduled for tomorrow  Acute combined systolic and diastolic heart failure -EF 35 to 40% - has JVD, trace LE edema -Continue IV Lasix 40 mg twice daily. -Plan to consolidate metoprolol to Toprol-XL and add Entresto prior to discharge   HTN -BP is generally elevated from the 120s-150s - Prior to admission he was on HCTZ 12.5 mg daily, will hold as patient is on Lasix as above, metoprolol adjustments as above. BP is tolerating medication adjustments well   ?Bronchitis/bilateral lower pneumonia -Antibiotics management per IM      For questions or updates, please contact [place] Please consult [email or web address] for contact info under        Signed, Donato Heinz, MD  06/30/2021, 12:08 PM

## 2021-06-30 NOTE — Discharge Instructions (Addendum)
Information on my medicine - ELIQUIS (apixaban)  This medication education was reviewed with me or my healthcare representative as part of my discharge preparation.  The pharmacist that spoke with me during my hospital stay was:  Claudia Pollock.  Why was Eliquis prescribed for you? Eliquis was prescribed for you to reduce the risk of a blood clot forming that can cause a stroke if you have a medical condition called atrial fibrillation (a type of irregular heartbeat).  What do You need to know about Eliquis ? Take your Eliquis TWICE DAILY - one tablet in the morning and one tablet in the evening with or without food. If you have difficulty swallowing the tablet whole please discuss with your pharmacist how to take the medication safely.  Take Eliquis exactly as prescribed by your doctor and DO NOT stop taking Eliquis without talking to the doctor who prescribed the medication.  Stopping may increase your risk of developing a stroke.  Refill your prescription before you run out.  After discharge, you should have regular check-up appointments with your healthcare provider that is prescribing your Eliquis.  In the future your dose may need to be changed if your kidney function or weight changes by a significant amount or as you get older.  What do you do if you miss a dose? If you miss a dose, take it as soon as you remember on the same day and resume taking twice daily.  Do not take more than one dose of ELIQUIS at the same time to make up a missed dose.  Important Safety Information A possible side effect of Eliquis is bleeding. You should call your healthcare provider right away if you experience any of the following: Bleeding from an injury or your nose that does not stop. Unusual colored urine (red or dark brown) or unusual colored stools (red or black). Unusual bruising for unknown reasons. A serious fall or if you hit your head (even if there is no bleeding).  Some medicines may  interact with Eliquis and might increase your risk of bleeding or clotting while on Eliquis. To help avoid this, consult your healthcare provider or pharmacist prior to using any new prescription or non-prescription medications, including herbals, vitamins, non-steroidal anti-inflammatory drugs (NSAIDs) and supplements.  This website has more information on Eliquis (apixaban): http://www.eliquis.com/eliquis/home

## 2021-06-30 NOTE — Progress Notes (Signed)
° ° °  TEE/DCCV scheduled for 02/02 at 09:00, orders written.  Rosaria Ferries, PA-C 06/30/2021 9:17 AM

## 2021-06-30 NOTE — H&P (View-Only) (Signed)
Progress Note  Patient Name: Jeffrey Andrews Date of Encounter: 06/30/2021  [facility] Cardiologist: Donato Heinz, MD   Subjective   Net -1.5 L yesterday.  BP 140/76 this morning.  Weight is down 11 pounds from admission.  Creatinine stable at 0.7.  Reports dyspnea improving but continues to have some shortness of breath  Inpatient Medications    Scheduled Meds:  apixaban  5 mg Oral BID   atorvastatin  20 mg Oral Daily   doxycycline  100 mg Oral Q12H   furosemide  40 mg Intravenous BID   levalbuterol  1.25 mg Nebulization TID   metFORMIN  500 mg Oral BID WC   metoprolol tartrate  50 mg Oral BID   potassium chloride  40 mEq Oral Q4H   Continuous Infusions:  cefTRIAXone (ROCEPHIN)  IV 2 g (06/30/21 0856)   PRN Meds: acetaminophen **OR** acetaminophen, levalbuterol, loratadine, metoprolol tartrate   Vital Signs    Vitals:   06/29/21 2000 06/30/21 0500 06/30/21 0532 06/30/21 0817  BP: (!) 155/115  140/76   Pulse: 80  97   Resp: 20     Temp: 98.1 F (36.7 C)  (!) 97.5 F (36.4 C)   TempSrc: Oral  Oral   SpO2: 95%  96% 95%  Weight:  99.1 kg    Height:        Intake/Output Summary (Last 24 hours) at 06/30/2021 1208 Last data filed at 06/30/2021 0800 Gross per 24 hour  Intake 460 ml  Output 2650 ml  Net -2190 ml    Last 3 Weights 06/30/2021 06/27/2021 11/09/2015  Weight (lbs) 218 lb 7.6 oz 229 lb 235 lb  Weight (kg) 99.1 kg 103.874 kg 106.595 kg      Telemetry    Afib, heart rate in 90s-120s - Personally Reviewed  ECG    No new tracings - Personally Reviewed  Physical Exam   GEN: No acute distress.   Neck: +JVD  Cardiac: Irregular R&R, no murmurs, rubs, or gallops.  Respiratory: CTAB GI: Soft, nontender, non-distended  MS: Trace edema in BLE, No deformity. Neuro:  Nonfocal  Psych: Normal affect   Labs    High Sensitivity Troponin:   Recent Labs  Lab 06/27/21 1035 06/27/21 1425  TROPONINIHS 10 10      Chemistry Recent Labs  Lab  06/27/21 1035 06/28/21 0347 06/29/21 0759 06/30/21 0351  NA 134* 137 135 136  K 3.6 3.9 3.8 3.2*  CL 101 103 96* 99  CO2 23 23 29 26   GLUCOSE 122* 149* 131* 135*  BUN 12 13 21 22   CREATININE 0.79 0.69 0.85 0.73  CALCIUM 8.7* 8.8* 9.2 9.0  MG 2.0  --   --  2.0  PROT 7.6 7.5  --   --   ALBUMIN 4.1 4.0 4.2 3.7  AST 23 23  --   --   ALT 14 12  --   --   ALKPHOS 40 39  --   --   BILITOT 0.7 1.0  --   --   GFRNONAA >60 >60 >60 >60  ANIONGAP 10 11 10 11      Lipids No results for input(s): CHOL, TRIG, HDL, LABVLDL, LDLCALC, CHOLHDL in the last 168 hours.  Hematology Recent Labs  Lab 06/27/21 1035 06/28/21 0347  WBC 8.5 6.2  RBC 4.62 4.52  HGB 15.2 14.9  HCT 43.8 43.0  MCV 94.8 95.1  MCH 32.9 33.0  MCHC 34.7 34.7  RDW 13.3 13.3  PLT 232  249    Thyroid  Recent Labs  Lab 06/27/21 1035  TSH 2.119     BNP Recent Labs  Lab 06/27/21 1035  BNP 232.0*     DDimer No results for input(s): DDIMER in the last 168 hours.   Radiology    No results found.  Cardiac Studies   Echo 06/28/2021  1. Left ventricular ejection fraction, by estimation, is 35 to 40%. The  left ventricle has moderately decreased function. The left ventricle  demonstrates global hypokinesis. There is mild asymmetric left ventricular  hypertrophy of the posterior wall  segment. Left ventricular diastolic parameters are indeterminate.   2. Right ventricular systolic function is normal. The right ventricular  size is normal. Tricuspid regurgitation signal is inadequate for assessing  PA pressure.   3. Left atrial size was severely dilated.   4. The mitral valve is normal in structure. Moderate mitral valve  regurgitation. No evidence of mitral stenosis.   5. The aortic valve is normal in structure. Aortic valve regurgitation is  not visualized. No aortic stenosis is present.   6. There is mild dilatation of the aortic root, measuring 37 mm. There is  mild dilatation of the ascending aorta,  measuring 37 mm.   7. The inferior vena cava is dilated in size with <50% respiratory  variability, suggesting right atrial pressure of 15 mmHg.   Patient Profile     76 y.o. male with a hx of HTN, OSA on CPAP, OA, skin CA, SEM, obesity, glucose intol, Ao atherosclerosis, who is being seen 06/28/2021 for the evaluation of atrial fib, RVR at the request of Dr Dwyane Dee.  Assessment & Plan    Atrial fib, RVR -New diagnosis, unknown duration. -he has been started on Eliquis 5 mg twice daily - Continue metoprolol 50 mg twice daily -- Per telemetry, patient remains in afib with HR in 90s-100s. 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  Scheduled for tomorrow  Acute combined systolic and diastolic heart failure -EF 35 to 40% - has JVD, trace LE edema -Continue IV Lasix 40 mg twice daily. -Plan to consolidate metoprolol to Toprol-XL and add Entresto prior to discharge   HTN -BP is generally elevated from the 120s-150s - Prior to admission he was on HCTZ 12.5 mg daily, will hold as patient is on Lasix as above, metoprolol adjustments as above. BP is tolerating medication adjustments well   ?Bronchitis/bilateral lower pneumonia -Antibiotics management per IM      For questions or updates, please contact [place] Please consult [email or web address] for contact info under        Signed, Donato Heinz, MD  06/30/2021, 12:08 PM

## 2021-06-30 NOTE — Progress Notes (Signed)
PROGRESS NOTE    Jeffrey Andrews  DJS:[id number] DOB: Oct 14, 1945 DOA: 06/27/2021 PCP: Shirline Frees, MD    Brief Narrative:  This 76 years old male with PMH significant for osteoarthritis, skin cancer of the left arm, glucose intolerance, history of functional heart murmur, hypertension, sleep apnea on CPAP, class I obesity, with BMI 34.8, presented in the ED with c/o: Progressively shortness of breath associated with postnasal drip, dry cough which is occasionally productive,  wheezing and fatigue.  Patient also reports chest pressure that bothers him more with inspiration.  He was found to have atrial fibrillation on EKG with RVR.  Patient was given Cardizem x 2, metoprolol.  Chest x-ray shows findings concerning for developing bronchopneumonia.   Patient is admitted for community-acquired pneumonia and atrial fibrillation with RVR.  Cardiology consulted, started on antibiotics.  TTE shows LVEF 35 to 40%.  Cardiology recommended TEE/DCCV scheduled for tomorrow.  Assessment & Plan:   Principal Problem:   Atrial fibrillation with RVR (HCC) Active Problems:   Sleep apnea syndrome   Bronchial pneumonia   Reactive airway disease   Hypocalcemia   Aortic atherosclerosis (HCC)   Hyperlipidemia   Glucose intolerance   Prolonged QT interval   Acute combined systolic and diastolic heart failure (HCC)   Essential hypertension  New onset atrial fibrillation with RVR: Patient presented with chest pain, shortness of breath on exertion. EKG showed new onset A. fib with RVR, High-sensitivity troponins negative. Continue Eliquis 5 mg p.o. daily. Continue metoprolol 50 mg IV twice daily Heart rate is reasonably controlled with intermittent going up to 120s. TTE showed LVEF 35 to 40%. Cardiology consulted, recommended TEE/DCCV scheduled for tomorrow.  Prolonged QT interval: Optimize electrolyte. Avoid QT prolonging agents. Repeat EKG shows normal QTC.  Bronchitis / CAP: Continue ceftriaxone  and doxycycline.   Reactive airway disease: Continue Xopenex nebs 3 times a day. She has received Solu-Medrol 40 mg IV once.  Sleep apnea syndrome: Continue CPAP at bedtime  Hypocalcemia: Improved.  Aortic atherosclerosis: Continue atorvastatin 20 mg daily  Glucose intolerance: Carb modified diet, hemoglobin A1c 5.8  DVT prophylaxis: Eliquis Code Status: Full code. Family Communication: No family at bed side. Disposition Plan:   Status is: Inpatient  Remains inpatient appropriate because:   Admitted for bronchopneumonia requiring IV antibiotics and A. fib with RVR cardiology consulted.  Scheduled to have a TEE/DCCV for atrial fibrillation  Consultants:  Cardiology  Procedures:  Echo Antimicrobials:  Anti-infectives (From admission, onward)    Start     Dose/Rate Route Frequency Ordered Stop   06/28/21 2200  doxycycline (VIBRA-TABS) tablet 100 mg        100 mg Oral Every 12 hours 06/28/21 1114 07/02/21 0959   06/27/21 1400  cefTRIAXone (ROCEPHIN) 2 g in sodium chloride 0.9 % 100 mL IVPB        2 g 200 mL/hr over 30 Minutes Intravenous Daily 06/27/21 1335 07/02/21 0959   06/27/21 1400  azithromycin (ZITHROMAX) tablet 500 mg  Status:  Discontinued        500 mg Oral Daily 06/27/21 1335 06/27/21 1341   06/27/21 1400  doxycycline (VIBRAMYCIN) 100 mg in sodium chloride 0.9 % 250 mL IVPB  Status:  Discontinued        100 mg 125 mL/hr over 120 Minutes Intravenous Every 12 hours 06/27/21 1341 06/28/21 1114        Subjective: Patient was seen and examined at bedside.  Overnight events noted.   Patient reports feeling much improved.  He  is scheduled to have TEE/DCCV tomorrow. He still remains in atrial fibrillation. Objective: Vitals:   06/30/21 0500 06/30/21 0532 06/30/21 0817 06/30/21 1312  BP:  140/76  130/77  Pulse:  97  60  Resp:    20  Temp:  (!) 97.5 F (36.4 C)  98 F (36.7 C)  TempSrc:  Oral  Oral  SpO2:  96% 95% 95%  Weight: 99.1 kg     Height:         Intake/Output Summary (Last 24 hours) at 06/30/2021 1340 Last data filed at 06/30/2021 0800 Gross per 24 hour  Intake 460 ml  Output 2650 ml  Net -2190 ml   Filed Weights   06/27/21 1144 06/30/21 0500  Weight: 103.9 kg 99.1 kg    Examination:  General exam: Appears comfortable, not in any acute distress. Respiratory system: Clear to auscultation bilaterally,  respiratory effort normal, RR 13. Cardiovascular system: S1 & S2 heard, irregular rhythm, no murmur. Gastrointestinal system: Abdomen is soft, nontender, nondistended, BS+ Central nervous system: Alert and oriented x 3. No focal neurological deficits. Extremities: No edema, no cyanosis, no clubbing. Skin: No rashes, lesions or ulcers Psychiatry: Judgement and insight appear normal. Mood & affect appropriate.     Data Reviewed: I have personally reviewed following labs and imaging studies  CBC: Recent Labs  Lab 06/27/21 1035 06/28/21 0347  WBC 8.5 6.2  NEUTROABS 5.9  --   HGB 15.2 14.9  HCT 43.8 43.0  MCV 94.8 95.1  PLT 232 378   Basic Metabolic Panel: Recent Labs  Lab 06/27/21 1035 06/28/21 0347 06/29/21 0759 06/30/21 0351  NA 134* 137 135 136  K 3.6 3.9 3.8 3.2*  CL 101 103 96* 99  CO2 23 23 29 26   GLUCOSE 122* 149* 131* 135*  BUN 12 13 21 22   CREATININE 0.79 0.69 0.85 0.73  CALCIUM 8.7* 8.8* 9.2 9.0  MG 2.0  --   --  2.0  PHOS  --   --  3.8 4.5   GFR: Estimated Creatinine Clearance: 89.7 mL/min (by C-G formula based on SCr of 0.73 mg/dL). Liver Function Tests: Recent Labs  Lab 06/27/21 1035 06/28/21 0347 06/29/21 0759 06/30/21 0351  AST 23 23  --   --   ALT 14 12  --   --   ALKPHOS 40 39  --   --   BILITOT 0.7 1.0  --   --   PROT 7.6 7.5  --   --   ALBUMIN 4.1 4.0 4.2 3.7   No results for input(s): LIPASE, AMYLASE in the last 168 hours. No results for input(s): AMMONIA in the last 168 hours. Coagulation Profile: No results for input(s): INR, PROTIME in the last 168 hours. Cardiac  Enzymes: No results for input(s): CKTOTAL, CKMB, CKMBINDEX, TROPONINI in the last 168 hours. BNP (last 3 results) No results for input(s): PROBNP in the last 8760 hours. HbA1C: Recent Labs    06/28/21 0347  HGBA1C 5.8*   CBG: Recent Labs  Lab 06/29/21 1117 06/29/21 1637 06/29/21 2139 06/30/21 0746 06/30/21 1209  GLUCAP 90 94 118* 120* 107*   Lipid Profile: No results for input(s): CHOL, HDL, LDLCALC, TRIG, CHOLHDL, LDLDIRECT in the last 72 hours. Thyroid Function Tests: No results for input(s): TSH, T4TOTAL, FREET4, T3FREE, THYROIDAB in the last 72 hours.  Anemia Panel: No results for input(s): VITAMINB12, FOLATE, FERRITIN, TIBC, IRON, RETICCTPCT in the last 72 hours. Sepsis Labs: No results for input(s): PROCALCITON, LATICACIDVEN in the last 168  hours.  No results found for this or any previous visit (from the past 240 hour(s)).   Radiology Studies: No results found.  Scheduled Meds:  apixaban  5 mg Oral BID   atorvastatin  20 mg Oral Daily   doxycycline  100 mg Oral Q12H   furosemide  40 mg Intravenous BID   levalbuterol  1.25 mg Nebulization TID   metFORMIN  500 mg Oral BID WC   metoprolol tartrate  50 mg Oral BID   potassium chloride  40 mEq Oral Q4H   Continuous Infusions:  cefTRIAXone (ROCEPHIN)  IV 2 g (06/30/21 0856)     LOS: 2 days    Time spent: 35 mins    Zyara Riling, MD [facility]   If 7PM-7AM, please contact night-coverage

## 2021-07-01 ENCOUNTER — Inpatient Hospital Stay (HOSPITAL_COMMUNITY): Payer: Medicare Other

## 2021-07-01 ENCOUNTER — Inpatient Hospital Stay (HOSPITAL_COMMUNITY): Payer: Medicare Other | Admitting: Certified Registered Nurse Anesthetist

## 2021-07-01 ENCOUNTER — Encounter (HOSPITAL_COMMUNITY): Payer: Self-pay

## 2021-07-01 ENCOUNTER — Encounter (HOSPITAL_COMMUNITY)
Admission: EM | Disposition: A | Discharge status: Home / Self Care | Payer: Self-pay | Source: Home / Self Care | Attending: Family Medicine

## 2021-07-01 DIAGNOSIS — I350 Nonrheumatic aortic (valve) stenosis: Secondary | ICD-10-CM

## 2021-07-01 DIAGNOSIS — I4891 Unspecified atrial fibrillation: Secondary | ICD-10-CM

## 2021-07-01 HISTORY — PX: TEE WITHOUT CARDIOVERSION: SHX5443

## 2021-07-01 HISTORY — PX: CARDIOVERSION: SHX1299

## 2021-07-01 LAB — RENAL FUNCTION PANEL
Albumin: 3.9 g/dL (ref 3.5–5.0)
Anion gap: 8 (ref 5–15)
BUN: 27 mg/dL — ABNORMAL HIGH (ref 8–23)
CO2: 28 mmol/L (ref 22–32)
Calcium: 9 mg/dL (ref 8.9–10.3)
Chloride: 99 mmol/L (ref 98–111)
Creatinine, Ser: 0.82 mg/dL (ref 0.61–1.24)
GFR, Estimated: >60 mL/min (ref >60)
Glucose, Bld: 137 mg/dL — ABNORMAL HIGH (ref 70–99)
Phosphorus: 4.3 mg/dL (ref 2.5–4.6)
Potassium: 3.4 mmol/L — ABNORMAL LOW (ref 3.5–5.1)
Sodium: 135 mmol/L (ref 135–145)

## 2021-07-01 LAB — BASIC METABOLIC PANEL
Anion gap: 9 (ref 5–15)
BUN: 26 mg/dL — ABNORMAL HIGH (ref 8–23)
CO2: 27 mmol/L (ref 22–32)
Calcium: 9.1 mg/dL (ref 8.9–10.3)
Chloride: 99 mmol/L (ref 98–111)
Creatinine, Ser: 0.86 mg/dL (ref 0.61–1.24)
GFR, Estimated: >60 mL/min (ref >60)
Glucose, Bld: 133 mg/dL — ABNORMAL HIGH (ref 70–99)
Potassium: 3.5 mmol/L (ref 3.5–5.1)
Sodium: 135 mmol/L (ref 135–145)

## 2021-07-01 LAB — ECHO TEE
MV M vel: 4.09 m/s
MV Peak grad: 66.8 mmHg
Radius: 0.35 cm

## 2021-07-01 LAB — GLUCOSE, CAPILLARY
Glucose-Capillary: 117 mg/dL — ABNORMAL HIGH (ref 70–99)
Glucose-Capillary: 141 mg/dL — ABNORMAL HIGH (ref 70–99)
Glucose-Capillary: 110 mg/dL — ABNORMAL HIGH (ref 70–99)
Glucose-Capillary: 168 mg/dL — ABNORMAL HIGH (ref 70–99)

## 2021-07-01 SURGERY — ECHOCARDIOGRAM, TRANSESOPHAGEAL
Anesthesia: General

## 2021-07-01 MED ORDER — FUROSEMIDE 40 MG PO TABS
40.0000 mg | ORAL_TABLET | Freq: Every day | ORAL | Status: DC
  Administered 2021-07-01 – 2021-07-04 (×4): 40 mg via ORAL
  Filled 2021-07-01 (×4): qty 1

## 2021-07-01 MED ORDER — PROPOFOL 500 MG/50ML IV EMUL
INTRAVENOUS | Status: DC | PRN
  Administered 2021-07-01: 75 ug/kg/min via INTRAVENOUS

## 2021-07-01 MED ORDER — POTASSIUM CHLORIDE CRYS ER 20 MEQ PO TBCR
40.0000 meq | EXTENDED_RELEASE_TABLET | Freq: Once | ORAL | Status: AC
  Administered 2021-07-01: 40 meq via ORAL
  Filled 2021-07-01: qty 2

## 2021-07-01 MED ORDER — PHENYLEPHRINE HCL-NACL 20-0.9 MG/250ML-% IV SOLN
INTRAVENOUS | Status: DC | PRN
  Administered 2021-07-01: 60 ug/min via INTRAVENOUS

## 2021-07-01 MED ORDER — PHENYLEPHRINE 40 MCG/ML (10ML) SYRINGE FOR IV PUSH (FOR BLOOD PRESSURE SUPPORT)
PREFILLED_SYRINGE | INTRAVENOUS | Status: DC | PRN
  Administered 2021-07-01: 80 ug via INTRAVENOUS
  Administered 2021-07-01: 160 ug via INTRAVENOUS

## 2021-07-01 MED ORDER — LIDOCAINE 2% (20 MG/ML) 5 ML SYRINGE
INTRAMUSCULAR | Status: DC | PRN
  Administered 2021-07-01: 40 mg via INTRAVENOUS

## 2021-07-01 MED ORDER — DEXMEDETOMIDINE (PRECEDEX) IN NS 20 MCG/5ML (4 MCG/ML) IV SYRINGE
PREFILLED_SYRINGE | INTRAVENOUS | Status: DC | PRN
  Administered 2021-07-01: 12 ug via INTRAVENOUS

## 2021-07-01 NOTE — Anesthesia Procedure Notes (Signed)
Procedure Name: MAC Date/Time: 07/01/2021 9:03 AM Performed by: Reece Agar, CRNA Pre-anesthesia Checklist: Patient identified, Emergency Drugs available, Suction available and Patient being monitored Patient Re-evaluated:Patient Re-evaluated prior to induction Oxygen Delivery Method: Nasal cannula

## 2021-07-01 NOTE — Anesthesia Postprocedure Evaluation (Signed)
Anesthesia Post Note  Patient: Jeffrey Andrews  Procedure(s) Performed: TRANSESOPHAGEAL ECHOCARDIOGRAM (TEE) CARDIOVERSION     Patient location during evaluation: PACU Anesthesia Type: General Level of consciousness: awake and alert Pain management: pain level controlled Vital Signs Assessment: post-procedure vital signs reviewed and stable Respiratory status: spontaneous breathing, nonlabored ventilation, respiratory function stable and patient connected to nasal cannula oxygen Cardiovascular status: blood pressure returned to baseline and stable Postop Assessment: no apparent nausea or vomiting Anesthetic complications: no   No notable events documented.  Last Vitals:  Vitals:   07/01/21 0955 07/01/21 1005  BP: 95/61 100/68  Pulse: 72 71  Resp: 19 20  Temp:    SpO2: 93% 95%    Last Pain:  Vitals:   07/01/21 0943  TempSrc: Temporal  PainSc: 0-No pain                 Effie Berkshire

## 2021-07-01 NOTE — Progress Notes (Deleted)
°  Echocardiogram 2D Echocardiogram has been performed.  Jeffrey Andrews 07/01/2021, 9:49 AM

## 2021-07-01 NOTE — Progress Notes (Signed)
PROGRESS NOTE    Jeffrey Andrews  MEQ:[id number] DOB: Dec 16, 1945 DOA: 06/27/2021 PCP: Shirline Frees, MD    Brief Narrative:  This 76 years old male with PMH significant for osteoarthritis, skin cancer of the left arm, glucose intolerance, history of functional heart murmur, hypertension, sleep apnea on CPAP, class I obesity, with BMI 34.8, presented in the ED with c/o: Progressively shortness of breath associated with postnasal drip, dry cough which is occasionally productive,  wheezing and fatigue.  Patient also reports chest pressure that bothers him more with inspiration.  He was found to have atrial fibrillation on EKG with RVR.  Patient was given Cardizem x 2, metoprolol.  Chest x-ray shows findings concerning for developing bronchopneumonia.   Patient is admitted for community-acquired pneumonia and atrial fibrillation with RVR.  Cardiology consulted, started on antibiotics.  TTE shows LVEF 35 to 40%.  Patient underwent successful TEE/DCCV, converted to NSR with PVCs PACs.  Given low EF patient should be started on Entresto before discharge.  Assessment & Plan:   Principal Problem:   Atrial fibrillation with RVR (HCC) Active Problems:   Sleep apnea syndrome   Bronchial pneumonia   Reactive airway disease   Hypocalcemia   Aortic atherosclerosis (HCC)   Hyperlipidemia   Glucose intolerance   Prolonged QT interval   Acute combined systolic and diastolic heart failure (HCC)   Essential hypertension  New onset atrial fibrillation with RVR: Patient presented with chest pain, shortness of breath on exertion. EKG showed new onset A. fib with RVR, High-sensitivity troponins negative. Continue Eliquis 5 mg p.o. daily. Continue metoprolol 50 mg IV twice daily Heart rate is reasonably controlled with intermittent going up to 120s. TTE showed LVEF 35 to 40%. Patient underwent successful TEE/DCCV on 07/01/21.  Converted to NSR with PACs and PVCs.  Acute combined systolic and diastolic  heart failure: Repeat echo shows LVEF 35 to 40%. Patient has been adequately diuresed with IV Lasix, Appears euvolemic.  Changed to p.o. Lasix today. Cardiology is planning to consolidate metoprolol to Toprol-XL and Entresto before discharge.  Prolonged QT interval: Optimize electrolyte. Avoid QT prolonging agents. Repeat EKG showed normal QTC.  Bronchitis / CAP: Continue ceftriaxone and doxycycline.  Reactive airway disease: Continue Xopenex nebs 3 times a day. She has received Solu-Medrol 40 mg IV once.  Sleep apnea syndrome: Continue CPAP at bedtime  Hypocalcemia: Improved.  Aortic atherosclerosis: Continue atorvastatin 20 mg daily  Glucose intolerance: Carb modified diet, hemoglobin A1c 5.8  DVT prophylaxis: Eliquis Code Status: Full code. Family Communication: No family at bed side. Disposition Plan:   Status is: Inpatient  Remains inpatient appropriate because:   Admitted for bronchopneumonia requiring IV antibiotics and A. fib with RVR cardiology consulted.  Scheduled to have a TEE/DCCV for atrial fibrillation  Consultants:  Cardiology  Procedures:  Echo Antimicrobials:  Anti-infectives (From admission, onward)    Start     Dose/Rate Route Frequency Ordered Stop   06/28/21 2200  doxycycline (VIBRA-TABS) tablet 100 mg        100 mg Oral Every 12 hours 06/28/21 1114 07/02/21 0959   06/27/21 1400  cefTRIAXone (ROCEPHIN) 2 g in sodium chloride 0.9 % 100 mL IVPB        2 g 200 mL/hr over 30 Minutes Intravenous Daily 06/27/21 1335 07/01/21 1129   06/27/21 1400  azithromycin (ZITHROMAX) tablet 500 mg  Status:  Discontinued        500 mg Oral Daily 06/27/21 1335 06/27/21 1341   06/27/21 1400  doxycycline (  VIBRAMYCIN) 100 mg in sodium chloride 0.9 % 250 mL IVPB  Status:  Discontinued        100 mg 125 mL/hr over 120 Minutes Intravenous Every 12 hours 06/27/21 1341 06/28/21 1114        Subjective: Patient was seen and examined at bedside.  Overnight events  noted.   Patient reports feeling much better.  He is s/p successful TEE/DCCV. He now remains in normal sinus rhythm. Objective: Vitals:   07/01/21 0831 07/01/21 0943 07/01/21 0955 07/01/21 1005  BP: 127/88 107/64 95/61 100/68  Pulse: (!) 111 68 72 71  Resp: 18 (!) 23 19 20   Temp: (!) 97.3 F (36.3 C) (!) 97.2 F (36.2 C)    TempSrc: Temporal Temporal    SpO2: 96% 96% 93% 95%  Weight:      Height:        Intake/Output Summary (Last 24 hours) at 07/01/2021 1223 Last data filed at 07/01/2021 0932 Gross per 24 hour  Intake 610.97 ml  Output 2650 ml  Net -2039.03 ml   Filed Weights   06/27/21 1144 06/30/21 0500 07/01/21 0700  Weight: 103.9 kg 99.1 kg 97.5 kg    Examination:  General exam: Appears comfortable, not in any acute distress. Respiratory system: Clear to auscultation bilaterally,  respiratory effort normal, RR 13. Cardiovascular system: S1 & S2 heard, regular rate and  rhythm, no murmur Gastrointestinal system: Abdomen is soft, nontender, nondistended, BS+ Central nervous system: Alert and oriented x 3. No focal neurological deficits. Extremities: No edema, no cyanosis, no clubbing. Skin: No rashes, lesions or ulcers Psychiatry: Judgement and insight appear normal. Mood & affect appropriate.     Data Reviewed: I have personally reviewed following labs and imaging studies  CBC: Recent Labs  Lab 06/27/21 1035 06/28/21 0347  WBC 8.5 6.2  NEUTROABS 5.9  --   HGB 15.2 14.9  HCT 43.8 43.0  MCV 94.8 95.1  PLT 232 321   Basic Metabolic Panel: Recent Labs  Lab 06/27/21 1035 06/28/21 0347 06/29/21 0759 06/30/21 0351 07/01/21 0353  NA 134* 137 135 136 135   135  K 3.6 3.9 3.8 3.2* 3.5   3.4*  CL 101 103 96* 99 99   99  CO2 23 23 29 26 27   28   GLUCOSE 122* 149* 131* 135* 133*   137*  BUN 12 13 21 22  26*   27*  CREATININE 0.79 0.69 0.85 0.73 0.86   0.82  CALCIUM 8.7* 8.8* 9.2 9.0 9.1   9.0  MG 2.0  --   --  2.0  --   PHOS  --   --  3.8 4.5 4.3    GFR: Estimated Creatinine Clearance: 86.7 mL/min (by C-G formula based on SCr of 0.82 mg/dL). Liver Function Tests: Recent Labs  Lab 06/27/21 1035 06/28/21 0347 06/29/21 0759 06/30/21 0351 07/01/21 0353  AST 23 23  --   --   --   ALT 14 12  --   --   --   ALKPHOS 40 39  --   --   --   BILITOT 0.7 1.0  --   --   --   PROT 7.6 7.5  --   --   --   ALBUMIN 4.1 4.0 4.2 3.7 3.9   No results for input(s): LIPASE, AMYLASE in the last 168 hours. No results for input(s): AMMONIA in the last 168 hours. Coagulation Profile: No results for input(s): INR, PROTIME in the last 168 hours. Cardiac  Enzymes: No results for input(s): CKTOTAL, CKMB, CKMBINDEX, TROPONINI in the last 168 hours. BNP (last 3 results) No results for input(s): PROBNP in the last 8760 hours. HbA1C: No results for input(s): HGBA1C in the last 72 hours.  CBG: Recent Labs  Lab 06/30/21 0746 06/30/21 1209 06/30/21 1747 06/30/21 2218 07/01/21 0748  GLUCAP 120* 107* 186* 127* 117*   Lipid Profile: No results for input(s): CHOL, HDL, LDLCALC, TRIG, CHOLHDL, LDLDIRECT in the last 72 hours. Thyroid Function Tests: No results for input(s): TSH, T4TOTAL, FREET4, T3FREE, THYROIDAB in the last 72 hours.  Anemia Panel: No results for input(s): VITAMINB12, FOLATE, FERRITIN, TIBC, IRON, RETICCTPCT in the last 72 hours. Sepsis Labs: No results for input(s): PROCALCITON, LATICACIDVEN in the last 168 hours.  Recent Results (from the past 240 hour(s))  Resp Panel by RT-PCR (Flu A&B, Covid) Nasopharyngeal Swab     Status: None   Collection Time: 06/30/21  4:42 PM   Specimen: Nasopharyngeal Swab; Nasopharyngeal(NP) swabs in vial transport medium  Result Value Ref Range Status   SARS Coronavirus 2 by RT PCR NEGATIVE NEGATIVE Final    Comment: (NOTE) SARS-CoV-2 target nucleic acids are NOT DETECTED.  The SARS-CoV-2 RNA is generally detectable in upper respiratory specimens during the acute phase of infection. The  lowest concentration of SARS-CoV-2 viral copies this assay can detect is 138 copies/mL. A negative result does not preclude SARS-Cov-2 infection and should not be used as the sole basis for treatment or other patient management decisions. A negative result may occur with  improper specimen collection/handling, submission of specimen other than nasopharyngeal swab, presence of viral mutation(s) within the areas targeted by this assay, and inadequate number of viral copies(<138 copies/mL). A negative result must be combined with clinical observations, patient history, and epidemiological information. The expected result is Negative.  Fact Sheet for Patients:  EntrepreneurPulse.com.au  Fact Sheet for Healthcare Providers:  IncredibleEmployment.be  This test is no t yet approved or cleared by the [place] FDA and  has been authorized for detection and/or diagnosis of SARS-CoV-2 by FDA under an Emergency Use Authorization (EUA). This EUA will remain  in effect (meaning this test can be used) for the duration of the COVID-19 declaration under Section 564(b)(1) of the Act, 21 U.S.C.section 360bbb-3(b)(1), unless the authorization is terminated  or revoked sooner.       Influenza A by PCR NEGATIVE NEGATIVE Final   Influenza B by PCR NEGATIVE NEGATIVE Final    Comment: (NOTE) The Xpert Xpress SARS-CoV-2/FLU/RSV plus assay is intended as an aid in the diagnosis of influenza from Nasopharyngeal swab specimens and should not be used as a sole basis for treatment. Nasal washings and aspirates are unacceptable for Xpert Xpress SARS-CoV-2/FLU/RSV testing.  Fact Sheet for Patients: EntrepreneurPulse.com.au  Fact Sheet for Healthcare Providers: IncredibleEmployment.be  This test is not yet approved or cleared by the [place] FDA and has been authorized for detection and/or diagnosis of SARS-CoV-2 by FDA under  an Emergency Use Authorization (EUA). This EUA will remain in effect (meaning this test can be used) for the duration of the COVID-19 declaration under Section 564(b)(1) of the Act, 21 U.S.C. section 360bbb-3(b)(1), unless the authorization is terminated or revoked.  Performed at [facility], [place] [address] [place], [place] [address]      Radiology Studies: ECHO TEE  Result Date: 07/01/2021    TRANSESOPHOGEAL ECHO REPORT   Patient Name:   Jeffrey Andrews Date of Exam: 07/01/2021 Medical Rec #:  [id number]  Height:       68.0 in Accession #:    [id number]    Weight:       215.0 lb Date of Birth:  Mar 21, 1946     BSA:          2.108 m Patient Age:    80 years      BP:           107/64 mmHg Patient Gender: M             HR:           92 bpm. Exam Location:  Inpatient Procedure: Transesophageal Echo, Color Doppler and Cardiac Doppler Indications:     atrial fibrillation  History:         Patient has prior history of Echocardiogram examinations, most                  recent 06/28/2021. Risk Factors:Hypertension, Dyslipidemia and                  Sleep Apnea.  Sonographer:     Johny Chess RDCS Referring Phys:  [id number] RHONDA G BARRETT Diagnosing Phys: [name] MD PROCEDURE: After discussion of the risks and benefits of a TEE, an informed consent was obtained from the patient. The transesophogeal probe was passed without difficulty through the esophogus of the patient. Sedation performed by different physician. The patient was monitored while under deep sedation. Anesthestetic sedation was provided intravenously by Anesthesiology: 40mg  of Propofol, 204mg  of Lidocaine. Image quality was good. The patient's vital signs; including heart rate, blood pressure, and oxygen saturation; remained stable throughout the procedure. The patient developed no complications during the procedure. A successful direct current cardioversion was performed at 150 joules with 1 attempt. IMPRESSIONS  1.  Left ventricular ejection fraction, by estimation, is 20 to 25%. The left ventricle has severely decreased function. The left ventricle demonstrates global hypokinesis.  2. Right ventricular systolic function is moderately reduced. The right ventricular size is normal.  3. No left atrial/left atrial appendage thrombus was detected. The LAA emptying velocity was 57 cm/s.  4. Multiple trivial to mild regurgitant jets. The mitral valve is normal in structure. Mild mitral valve regurgitation. No evidence of mitral stenosis.  5. The aortic valve is tricuspid. Aortic valve regurgitation is trivial. No aortic stenosis is present.  6. Aortic dilatation noted. There is mild dilatation of the ascending aorta, measuring 38 mm.  7. The inferior vena cava is normal in size with greater than 50% respiratory variability, suggesting right atrial pressure of 3 mmHg. Conclusion(s)/Recommendation(s): Normal biventricular function without evidence of hemodynamically significant valvular heart disease. FINDINGS  Left Ventricle: Left ventricular ejection fraction, by estimation, is 20 to 25%. The left ventricle has severely decreased function. The left ventricle demonstrates global hypokinesis. The left ventricular internal cavity size was normal in size. There is no left ventricular hypertrophy. Right Ventricle: The right ventricular size is normal. No increase in right ventricular wall thickness. Right ventricular systolic function is moderately reduced. Left Atrium: Left atrial size was normal in size. No left atrial/left atrial appendage thrombus was detected. The LAA emptying velocity was 57 cm/s. Right Atrium: Right atrial size was normal in size. Pericardium: There is no evidence of pericardial effusion. Mitral Valve: Multiple trivial to mild regurgitant jets. The mitral valve is normal in structure. Mild mitral valve regurgitation. No evidence of mitral valve stenosis. Tricuspid Valve: The tricuspid valve is normal in structure.  Tricuspid valve regurgitation is not demonstrated. No  evidence of tricuspid stenosis. Aortic Valve: The aortic valve is tricuspid. Aortic valve regurgitation is trivial. No aortic stenosis is present. Pulmonic Valve: The pulmonic valve was normal in structure. Pulmonic valve regurgitation is trivial. No evidence of pulmonic stenosis. Aorta: Aortic dilatation noted. There is mild dilatation of the ascending aorta, measuring 38 mm. Venous: The inferior vena cava is normal in size with greater than 50% respiratory variability, suggesting right atrial pressure of 3 mmHg. IAS/Shunts: No atrial level shunt detected by color flow Doppler.  MR Peak grad:    66.8 mmHg MR Mean grad:    42.3 mmHg MR Vmax:         408.67 cm/s MR Vmean:        294.7 cm/s MR PISA:         0.77 cm MR PISA Eff ROA: 8 mm MR PISA Radius:  0.35 cm [name] MD Electronically signed by [name] MD Signature Date/Time: 07/01/2021/10:06:46 AM    Final     Scheduled Meds:  apixaban  5 mg Oral BID   atorvastatin  20 mg Oral Daily   doxycycline  100 mg Oral Q12H   furosemide  40 mg Oral Daily   levalbuterol  1.25 mg Nebulization BID   metFORMIN  500 mg Oral BID WC   metoprolol tartrate  50 mg Oral BID   Continuous Infusions:     LOS: 3 days    Time spent: 35 mins    Citlalic Norlander, MD [facility]   If 7PM-7AM, please contact night-coverage

## 2021-07-01 NOTE — Anesthesia Preprocedure Evaluation (Addendum)
Anesthesia Evaluation  Patient identified by MRN, date of birth, ID band Patient awake    Reviewed: Allergy & Precautions, NPO status , Patient's Chart, lab work & pertinent test results  Airway Mallampati: II  TM Distance: >3 FB Neck ROM: Full    Dental  (+) Teeth Intact   Pulmonary sleep apnea ,    breath sounds clear to auscultation       Cardiovascular hypertension, + Valvular Problems/Murmurs MR  Rhythm:Irregular Rate:Tachycardia  Echo: 1. Left ventricular ejection fraction, by estimation, is 35 to 40%. The  left ventricle has moderately decreased function. The left ventricle  demonstrates global hypokinesis. There is mild asymmetric left ventricular  hypertrophy of the posterior wall  segment. Left ventricular diastolic parameters are indeterminate.  2. Right ventricular systolic function is normal. The right ventricular  size is normal. Tricuspid regurgitation signal is inadequate for assessing  PA pressure.  3. Left atrial size was severely dilated.  4. The mitral valve is normal in structure. Moderate mitral valve  regurgitation. No evidence of mitral stenosis.  5. The aortic valve is normal in structure. Aortic valve regurgitation is  not visualized. No aortic stenosis is present.  6. There is mild dilatation of the aortic root, measuring 37 mm. There is  mild dilatation of the ascending aorta, measuring 37 mm.  7. The inferior vena cava is dilated in size with <50% respiratory  variability, suggesting right atrial pressure of 15 mmHg.    Neuro/Psych negative neurological ROS  negative psych ROS   GI/Hepatic negative GI ROS, Neg liver ROS,   Endo/Other  negative endocrine ROS  Renal/GU negative Renal ROS     Musculoskeletal  (+) Arthritis ,   Abdominal Normal abdominal exam  (+)   Peds  Hematology negative hematology ROS (+)   Anesthesia Other Findings   Reproductive/Obstetrics                             Anesthesia Physical Anesthesia Plan  ASA: 3  Anesthesia Plan: General   Post-op Pain Management:    Induction:   PONV Risk Score and Plan: 0 and Propofol infusion  Airway Management Planned: Natural Airway and Mask  Additional Equipment: None  Intra-op Plan:   Post-operative Plan:   Informed Consent: I have reviewed the patients History and Physical, chart, labs and discussed the procedure including the risks, benefits and alternatives for the proposed anesthesia with the patient or authorized representative who has indicated his/her understanding and acceptance.       Plan Discussed with: CRNA  Anesthesia Plan Comments:        Anesthesia Quick Evaluation

## 2021-07-01 NOTE — Progress Notes (Signed)
°  Echocardiogram Echocardiogram Transesophageal has been performed.  Jeffrey Andrews 07/01/2021, 9:50 AM

## 2021-07-01 NOTE — Interval H&P Note (Signed)
History and Physical Interval Note:  07/01/2021 8:33 AM  Jeffrey Andrews  has presented today for surgery, with the diagnosis of AFIB.  The various methods of treatment have been discussed with the patient and family. After consideration of risks, benefits and other options for treatment, the patient has consented to  Procedure(s): TRANSESOPHAGEAL ECHOCARDIOGRAM (TEE) (N/A) CARDIOVERSION (N/A) as a surgical intervention.  The patient's history has been reviewed, patient examined, no change in status, stable for surgery.  I have reviewed the patient's chart and labs.  Questions were answered to the patient's satisfaction.     [name], MD

## 2021-07-01 NOTE — Transfer of Care (Signed)
Immediate Anesthesia Transfer of Care Note  Patient: Jeffrey Andrews  Procedure(s) Performed: TRANSESOPHAGEAL ECHOCARDIOGRAM (TEE) CARDIOVERSION  Patient Location: Endoscopy Unit  Anesthesia Type:MAC  Level of Consciousness: awake and alert   Airway & Oxygen Therapy: Patient Spontanous Breathing and Patient connected to nasal cannula oxygen  Post-op Assessment: Report given to RN and Post -op Vital signs reviewed and stable  Post vital signs: Reviewed and stable  Last Vitals:  Vitals Value Taken Time  BP 107/64 07/01/21 0943  Temp    Pulse 67 07/01/21 0945  Resp 18 07/01/21 0945  SpO2 96 % 07/01/21 0945  Vitals shown include unvalidated device data.  Last Pain:  Vitals:   07/01/21 0831  TempSrc: Temporal  PainSc: 0-No pain      Patients Stated Pain Goal: 3 (48/40/39 [id number])  Complications: No notable events documented.

## 2021-07-01 NOTE — CV Procedure (Signed)
Brief TEE Note  LVEF 20-25% Global hypokinesis Mild MR with multiple regurgitant jets Trivial AR.  Trivial PR. No LA/LAA thrombus or masses.  For additional details see full report.   Electrical Cardioversion Procedure Note Jeffrey Andrews [id number] 03-Oct-1945  Procedure: Electrical Cardioversion Indications:  Atrial Fibrillation  Procedure Details Consent: Risks of procedure as well as the alternatives and risks of each were explained to the (patient/caregiver).  Consent for procedure obtained. Time Out: Verified patient identification, verified procedure, site/side was marked, verified correct patient position, special equipment/implants available, medications/allergies/relevent history reviewed, required imaging and test results available.  Performed  Patient placed on cardiac monitor, pulse oximetry, supplemental oxygen as necessary.  Sedation given:  propofol Pacer pads placed anterior and posterior chest.  Cardioverted 1 time(s).  Cardioverted at 150J.  Successful.   Evaluation Findings: Post procedure EKG shows:  Sinus rhythm with PVCs and PACs Complications: None Patient did tolerate procedure well.   Jeffrey Latch, MD 07/01/2021, 9:33 AM

## 2021-07-01 NOTE — Progress Notes (Signed)
Progress Note  Patient Name: Jeffrey Andrews Date of Encounter: 07/01/2021  [facility] Cardiologist: Donato Heinz, MD   Subjective   Net -2.4 L yesterday.  BP 127/88 this morning.  Weight is down 14 pounds from admission.  Creatinine stable at 0.86.  Reports dyspnea improved  Inpatient Medications    Scheduled Meds:  [MAR Hold] apixaban  5 mg Oral BID   [MAR Hold] atorvastatin  20 mg Oral Daily   [MAR Hold] doxycycline  100 mg Oral Q12H   [MAR Hold] furosemide  40 mg Intravenous BID   [MAR Hold] levalbuterol  1.25 mg Nebulization BID   [MAR Hold] metFORMIN  500 mg Oral BID WC   [MAR Hold] metoprolol tartrate  50 mg Oral BID   Continuous Infusions:  sodium chloride 20 mL/hr at 07/01/21 0647   [MAR Hold] cefTRIAXone (ROCEPHIN)  IV 2 g (06/30/21 0856)   PRN Meds: [MAR Hold] acetaminophen **OR** [MAR Hold] acetaminophen, [MAR Hold] levalbuterol, [MAR Hold] loratadine, [MAR Hold] metoprolol tartrate   Vital Signs    Vitals:   06/30/21 2105 07/01/21 0656 07/01/21 0700 07/01/21 0831  BP:  (!) 149/90  127/88  Pulse: (!) 109 (!) 52  (!) 111  Resp:  18  18  Temp:  98 F (36.7 C)  (!) 97.3 F (36.3 C)  TempSrc:  Oral  Temporal  SpO2:  95%  96%  Weight:   97.5 kg   Height:        Intake/Output Summary (Last 24 hours) at 07/01/2021 0851 Last data filed at 07/01/2021 0700 Gross per 24 hour  Intake 460.97 ml  Output 2650 ml  Net -2189.03 ml    Last 3 Weights 07/01/2021 06/30/2021 06/27/2021  Weight (lbs) 215 lb 218 lb 7.6 oz 229 lb  Weight (kg) 97.523 kg 99.1 kg 103.874 kg      Telemetry    Afib - Personally Reviewed  ECG    No new tracings - Personally Reviewed  Physical Exam   GEN: No acute distress.   Neck: No JVD  Cardiac: Irregular R&R, no murmurs, rubs, or gallops.  Respiratory: CTAB GI: Soft, nontender, non-distended  MS: No edema in BLE, No deformity. Neuro:  Nonfocal  Psych: Normal affect   Labs    High Sensitivity Troponin:   Recent Labs   Lab 06/27/21 1035 06/27/21 1425  TROPONINIHS 10 10      Chemistry Recent Labs  Lab 06/27/21 1035 06/28/21 0347 06/29/21 0759 06/30/21 0351 07/01/21 0353  NA 134* 137 135 136 135   135  K 3.6 3.9 3.8 3.2* 3.5   3.4*  CL 101 103 96* 99 99   99  CO2 23 23 29 26 27   28   GLUCOSE 122* 149* 131* 135* 133*   137*  BUN 12 13 21 22  26*   27*  CREATININE 0.79 0.69 0.85 0.73 0.86   0.82  CALCIUM 8.7* 8.8* 9.2 9.0 9.1   9.0  MG 2.0  --   --  2.0  --   PROT 7.6 7.5  --   --   --   ALBUMIN 4.1 4.0 4.2 3.7 3.9  AST 23 23  --   --   --   ALT 14 12  --   --   --   ALKPHOS 40 39  --   --   --   BILITOT 0.7 1.0  --   --   --   GFRNONAA >60 >60 >60 >60 >  60   >60  ANIONGAP 10 11 10 11 9   8      Lipids No results for input(s): CHOL, TRIG, HDL, LABVLDL, LDLCALC, CHOLHDL in the last 168 hours.  Hematology Recent Labs  Lab 06/27/21 1035 06/28/21 0347  WBC 8.5 6.2  RBC 4.62 4.52  HGB 15.2 14.9  HCT 43.8 43.0  MCV 94.8 95.1  MCH 32.9 33.0  MCHC 34.7 34.7  RDW 13.3 13.3  PLT 232 249    Thyroid  Recent Labs  Lab 06/27/21 1035  TSH 2.119     BNP Recent Labs  Lab 06/27/21 1035  BNP 232.0*     DDimer No results for input(s): DDIMER in the last 168 hours.   Radiology    No results found.  Cardiac Studies   Echo 06/28/2021  1. Left ventricular ejection fraction, by estimation, is 35 to 40%. The  left ventricle has moderately decreased function. The left ventricle  demonstrates global hypokinesis. There is mild asymmetric left ventricular  hypertrophy of the posterior wall  segment. Left ventricular diastolic parameters are indeterminate.   2. Right ventricular systolic function is normal. The right ventricular  size is normal. Tricuspid regurgitation signal is inadequate for assessing  PA pressure.   3. Left atrial size was severely dilated.   4. The mitral valve is normal in structure. Moderate mitral valve  regurgitation. No evidence of mitral stenosis.   5. The  aortic valve is normal in structure. Aortic valve regurgitation is  not visualized. No aortic stenosis is present.   6. There is mild dilatation of the aortic root, measuring 37 mm. There is  mild dilatation of the ascending aorta, measuring 37 mm.   7. The inferior vena cava is dilated in size with <50% respiratory  variability, suggesting right atrial pressure of 15 mmHg.   Patient Profile     76 y.o. male with a hx of HTN, OSA on CPAP, OA, skin CA, SEM, obesity, glucose intol, Ao atherosclerosis, who is being seen 06/28/2021 for the evaluation of atrial fib, RVR at the request of Dr Dwyane Dee.  Assessment & Plan    Atrial fib, RVR - New diagnosis, unknown duration. 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 BP is tolerating medication adjustments well   ?Bronchitis/bilateral lower pneumonia -Antibiotics management per IM      For questions or updates, please contact [place] Please consult [email or web address] for contact info under        Signed, Donato Heinz, MD  07/01/2021, 8:51 AM

## 2021-07-02 LAB — BASIC METABOLIC PANEL
Anion gap: 12 (ref 5–15)
BUN: 28 mg/dL — ABNORMAL HIGH (ref 8–23)
CO2: 25 mmol/L (ref 22–32)
Calcium: 8.9 mg/dL (ref 8.9–10.3)
Chloride: 99 mmol/L (ref 98–111)
Creatinine, Ser: 0.67 mg/dL (ref 0.61–1.24)
GFR, Estimated: >60 mL/min (ref >60)
Glucose, Bld: 184 mg/dL — ABNORMAL HIGH (ref 70–99)
Potassium: 3.5 mmol/L (ref 3.5–5.1)
Sodium: 136 mmol/L (ref 135–145)

## 2021-07-02 LAB — GLUCOSE, CAPILLARY
Glucose-Capillary: 162 mg/dL — ABNORMAL HIGH (ref 70–99)
Glucose-Capillary: 119 mg/dL — ABNORMAL HIGH (ref 70–99)
Glucose-Capillary: 117 mg/dL — ABNORMAL HIGH (ref 70–99)
Glucose-Capillary: 139 mg/dL — ABNORMAL HIGH (ref 70–99)

## 2021-07-02 LAB — MAGNESIUM: Magnesium: 1.9 mg/dL (ref 1.7–2.4)

## 2021-07-02 MED ORDER — APIXABAN 5 MG PO TABS: 5.0000 mg | ORAL_TABLET | Freq: Two times a day (BID) | ORAL | 6 refills | Status: DC

## 2021-07-02 MED ORDER — METOPROLOL SUCCINATE ER 50 MG PO TB24: 50.0000 mg | ORAL_TABLET | Freq: Every day | ORAL | 1 refills | Status: DC

## 2021-07-02 MED ORDER — POTASSIUM CHLORIDE CRYS ER 20 MEQ PO TBCR
40.0000 meq | EXTENDED_RELEASE_TABLET | Freq: Once | ORAL | Status: AC
  Administered 2021-07-02: 40 meq via ORAL
  Filled 2021-07-02: qty 2

## 2021-07-02 MED ORDER — AMIODARONE LOAD VIA INFUSION
150.0000 mg | Freq: Once | INTRAVENOUS | Status: AC
  Administered 2021-07-02: 150 mg via INTRAVENOUS
  Filled 2021-07-02 (×2): qty 83.34

## 2021-07-02 MED ORDER — AMIODARONE HCL IN DEXTROSE 360-4.14 MG/200ML-% IV SOLN
60.0000 mg/h | INTRAVENOUS | Status: DC
  Administered 2021-07-02 (×2): 60 mg/h via INTRAVENOUS
  Filled 2021-07-02 (×2): qty 200

## 2021-07-02 MED ORDER — FUROSEMIDE 40 MG PO TABS: 40.0000 mg | ORAL_TABLET | Freq: Every day | ORAL | 1 refills | Status: DC

## 2021-07-02 MED ORDER — SACUBITRIL-VALSARTAN 24-26 MG PO TABS
1.0000 | ORAL_TABLET | Freq: Two times a day (BID) | ORAL | Status: DC
  Administered 2021-07-02 – 2021-07-04 (×5): 1 via ORAL
  Filled 2021-07-02 (×5): qty 1

## 2021-07-02 MED ORDER — SACUBITRIL-VALSARTAN 24-26 MG PO TABS: 1.0000 | ORAL_TABLET | Freq: Two times a day (BID) | ORAL | 2 refills | Status: AC

## 2021-07-02 MED ORDER — AMIODARONE HCL IN DEXTROSE 360-4.14 MG/200ML-% IV SOLN
30.0000 mg/h | INTRAVENOUS | Status: DC
  Administered 2021-07-02 – 2021-07-04 (×4): 30 mg/h via INTRAVENOUS
  Filled 2021-07-02 (×4): qty 200

## 2021-07-02 MED ORDER — METOPROLOL SUCCINATE ER 50 MG PO TB24
50.0000 mg | ORAL_TABLET | Freq: Every day | ORAL | Status: DC
  Administered 2021-07-02 – 2021-07-04 (×3): 50 mg via ORAL
  Filled 2021-07-02 (×3): qty 1

## 2021-07-02 NOTE — Progress Notes (Signed)
Pt is on cpap at home but he doesn't want to wear it here at the hospital, he is refusing. He said it doesn't feel good to him. He did request [place] 3L what he does at home.

## 2021-07-02 NOTE — Progress Notes (Signed)
°  Amiodarone Drug - Drug Interaction Consult Note  Recommendations: Monitor for bradycardia and/or heart block Monitor for new myalgias while on atorvastatin  Amiodarone is metabolized by the cytochrome P450 system and therefore has the potential to cause many drug interactions. Amiodarone has an average plasma half-life of 50 days (range 20 to 100 days).   There is potential for drug interactions to occur several weeks or months after stopping treatment and the onset of drug interactions may be slow after initiating amiodarone.   [x]  Statins: Increased risk of myopathy. Simvastatin- restrict dose to 20mg  daily. Other statins: counsel patients to report any muscle pain or weakness immediately.  []  Anticoagulants: Amiodarone can increase anticoagulant effect. Consider warfarin dose reduction. Patients should be monitored closely and the dose of anticoagulant altered accordingly, remembering that amiodarone levels take several weeks to stabilize.  []  Antiepileptics: Amiodarone can increase plasma concentration of phenytoin, the dose should be reduced. Note that small changes in phenytoin dose can result in large changes in levels. Monitor patient and counsel on signs of toxicity.  [x]  Beta blockers: increased risk of bradycardia, AV block and myocardial depression. Sotalol - avoid concomitant use.  []   Calcium channel blockers (diltiazem and verapamil): increased risk of bradycardia, AV block and myocardial depression.  []   Cyclosporine: Amiodarone increases levels of cyclosporine. Reduced dose of cyclosporine is recommended.  []  Digoxin dose should be halved when amiodarone is started.  []  Diuretics: increased risk of cardiotoxicity if hypokalemia occurs.  []  Oral hypoglycemic agents (glyburide, glipizide, glimepiride): increased risk of hypoglycemia. Patient's glucose levels should be monitored closely when initiating amiodarone therapy.   []  Drugs that prolong the QT interval:  Torsades  de pointes risk may be increased with concurrent use - avoid if possible.  Monitor QTc, also keep magnesium/potassium WNL if concurrent therapy can't be avoided.  Antibiotics: e.g. fluoroquinolones, erythromycin.  Antiarrhythmics: e.g. quinidine, procainamide, disopyramide, sotalol.  Antipsychotics: e.g. phenothiazines, haloperidol.   Lithium, tricyclic antidepressants, and methadone. Thank You,  Thiago Ragsdale A  07/02/2021 2:21 PM

## 2021-07-02 NOTE — Discharge Summary (Signed)
Physician Discharge Summary  Jeffrey Andrews JSE:[id number] DOB: [birth year] DOA: 06/27/2021  PCP: Shirline Frees, MD  Admit date: 06/27/2021  Discharge date: 07/04/2021  Admitted From: Home.  Disposition:  Home.  Recommendations for Outpatient Follow-up:  Follow up with PCP in 1-2 weeks. Please obtain BMP/CBC in one week. Advised to follow-up with Cardiology Dr. Su Grand as scheduled. Advised to take Lasix 40 mg daily, metoprolol 50 mg daily, Entresto 1 tablet twice daily, Eliquis 5 mg twice daily. Advised to take amiodarone 200 mg twice daily for 1 week and then 200 mg daily  Home Health:None Equipment/Devices:None  Discharge Condition: Stable CODE STATUS: Full code Diet recommendation: Heart Healthy  Brief [facility] course: This 76 years old male with PMH significant for osteoarthritis, skin cancer of the left arm, glucose intolerance, history of functional heart murmur, hypertension, sleep apnea on CPAP, class I obesity, with BMI 34.8, presented in the ED with c/o: Progressive shortness of breath associated with postnasal drip, dry cough which is occasionally productive,  wheezing and fatigue.  Patient also reports chest pressure that bothers him more with inspiration.  He was found to have atrial fibrillation on EKG with RVR.  Patient was given Cardizem x 2, metoprolol. Chest x-ray shows findings concerning for developing bronchopneumonia.   Patient was admitted for community-acquired pneumonia and atrial fibrillation with RVR.  Cardiology consulted, started on antibiotics.  TTE shows LVEF 35 to 40%.  Patient underwent successful TEE/DCCV, converted to NSR with PVCs PACs.  Given low EF Patient was started on Entresto , Patient completed antibiotics for 5 days.  Patient was supposed to be discharged but then he went into A-fib with RVR.  Patient was started on amiodarone gtt. after 24 hours, Patient converted into normal sinus rhythm.  Patient was cleared from cardiology to be  discharged on amiodarone 200 mg twice daily for 1 week followed by 200 mg daily.  Patient feels better and want to be discharged.  Patient will follow-up with cardiology 1 to 2 weeks.   He was managed for below problems.  Discharge Diagnoses:  Principal Problem:   Atrial fibrillation with RVR (HCC) Active Problems:   Sleep apnea syndrome   Bronchial pneumonia   Reactive airway disease   Hypocalcemia   Aortic atherosclerosis (HCC)   Hyperlipidemia   Glucose intolerance   Prolonged QT interval   Acute combined systolic and diastolic heart failure (HCC)   Essential hypertension  New onset atrial fibrillation with RVR: Patient presented with chest pain, shortness of breath on exertion. EKG showed new onset A. fib with RVR, High-sensitivity troponins negative. Continue Eliquis 5 mg p.o. daily. Continue metoprolol 50 mg IV daily Heart rate is reasonably controlled with intermittent going up to 120s. TTE showed LVEF 35 to 40%. Patient underwent successful TEE/DCCV on 07/01/21.  Converted to NSR with PACs and PVCs. Patient back into A-fib, started on amiodarone gtt. Now patient converted to sinus rhythm, cleared from cardiology. Patient being discharged home on amiodarone 200 mg twice daily for 1 week followed by 200 mg daily.   Acute combined systolic and diastolic heart failure: Repeat echo shows LVEF 35 to 40%. Patient has been adequately diuresed with IV Lasix, Appears euvolemic.  Changed to p.o. Lasix today. Continue Toprol-XL and Entresto.   Prolonged QT interval: Avoid QT prolonging agents. Repeat EKG showed normal QTC.   Bronchitis / CAP: Completed ceftriaxone and doxycycline x 5 days.   Reactive airway disease: Continue Xopenex nebs 3 times a day. She has received Solu-Medrol 40  mg IV once.   Sleep apnea syndrome: Continue CPAP at bedtime   Hypocalcemia: Improved.   Aortic atherosclerosis: Continue atorvastatin 20 mg daily   Glucose intolerance: Carb  modified diet, hemoglobin A1c 5.8  Discharge Instructions  Discharge Instructions     Amb referral to [facility]   Complete by: As directed    Call MD for:  difficulty breathing, headache or visual disturbances   Complete by: As directed    Call MD for:  persistant dizziness or light-headedness   Complete by: As directed    Call MD for:  persistant dizziness or light-headedness   Complete by: As directed    Call MD for:  persistant nausea and vomiting   Complete by: As directed    Call MD for:  persistant nausea and vomiting   Complete by: As directed    Call MD for:  severe uncontrolled pain   Complete by: As directed    Diet - low sodium heart healthy   Complete by: As directed    Diet - low sodium heart healthy   Complete by: As directed    Diet Carb Modified   Complete by: As directed    Diet Carb Modified   Complete by: As directed    Discharge instructions   Complete by: As directed    Advised to follow-up with primary care physician in 1 week. Advised to follow-up with cardiology Dr. Nechama Guard as scheduled. Advised to take Lasix 40 mg daily, metoprolol 50 mg daily, Entresto 1 tablet twice daily, Eliquis 5 mg twice daily.   Discharge instructions   Complete by: As directed    Advised to follow-up with cardiology in 2 weeks. Advised to take amiodarone 200 mg twice daily for 1 week and then 200 mg daily. Advised to take Eliquis 5 mg twice daily Advised to take Toprol XL daily.   Increase activity slowly   Complete by: As directed    Increase activity slowly   Complete by: As directed       Allergies as of 07/04/2021   No Known Allergies      Medication List     STOP taking these medications    hydrochlorothiazide 25 MG tablet Commonly known as: HYDRODIURIL   methocarbamol 500 MG tablet Commonly known as: ROBAXIN   oxyCODONE 5 MG immediate release tablet Commonly known as: Oxy IR/ROXICODONE   traMADol 50 MG tablet Commonly known as: ULTRAM        TAKE these medications    acetaminophen 500 MG tablet Commonly known as: TYLENOL Take 500 mg by mouth every 6 (six) hours as needed (For pain.).   amiodarone 200 MG tablet Commonly known as: PACERONE Advised to take amiodarone 200 mg twice daily for 1 week and then 200 mg daily until follow-up with cardiologist.   apixaban 5 MG Tabs tablet Commonly known as: ELIQUIS Take 1 tablet (5 mg total) by mouth 2 (two) times daily.   atorvastatin 20 MG tablet Commonly known as: LIPITOR Take 20 mg by mouth daily.   Fish Oil 1000 MG Caps Take 1 capsule by mouth 2 (two) times daily.   furosemide 40 MG tablet Commonly known as: LASIX Take 1 tablet (40 mg total) by mouth daily.   ipratropium 0.03 % nasal spray Commonly known as: ATROVENT Place 1 spray into both nostrils daily as needed for rhinitis.   loratadine 10 MG tablet Commonly known as: CLARITIN Take 10 mg by mouth daily as needed for allergies or rhinitis.  metFORMIN 500 MG tablet Commonly known as: GLUCOPHAGE Take 500 mg by mouth 2 (two) times daily with a meal.   metoprolol succinate 50 MG 24 hr tablet Commonly known as: TOPROL-XL Take 1 tablet (50 mg total) by mouth daily. Take with or immediately following a meal.   Multi-Vitamin tablet Take 1 tablet by mouth daily.   sacubitril-valsartan 24-26 MG Commonly known as: ENTRESTO Take 1 tablet by mouth 2 (two) times daily.        Follow-up Information     Shirline Frees, MD Follow up in 1 week(s).   Specialty: Family Medicine Contact information: [place] [address] [phone]         Donato Heinz, MD .   Specialties: Cardiology, Radiology Contact information: [address] [place] [address] [phone]                No Known Allergies  Consultations: Cardiology   Procedures/Studies: DG Chest 2 View  Result Date: 06/27/2021 CLINICAL DATA:  76 year old male with  history of shortness of breath for the past 3-4 days. EXAM: CHEST - 2 VIEW COMPARISON:  Chest x-ray 05/05/2012. FINDINGS: Lung volumes are normal. Widespread interstitial prominence, diffuse peribronchial cuffing, and patchy ill-defined opacities in the lung bases bilaterally. No pleural effusions. No pneumothorax. No pulmonary nodule or mass noted. Pulmonary vasculature and the cardiomediastinal silhouette are within normal limits. Atherosclerotic calcifications in the thoracic aorta. IMPRESSION: 1. The appearance of the chest is compatible with severe bronchitis, potentially with developing bilateral lower lobe bronchopneumonia. 2. Aortic atherosclerosis. Electronically Signed   By: Vinnie Langton M.D.   On: 06/27/2021 11:16   ECHOCARDIOGRAM COMPLETE  Result Date: 06/28/2021    ECHOCARDIOGRAM REPORT   Patient Name:   Jeffrey Andrews Date of Exam: 06/28/2021 Medical Rec #:  [id number]     Height:       68.0 in Accession #:    [id number]    Weight:       229.0 lb Date of Birth:  Dec 22, 1945     BSA:          2.165 m Patient Age:    44 years      BP:           132/82 mmHg Patient Gender: M             HR:           63 bpm. Exam Location:  Inpatient Procedure: 2D Echo, Color Doppler and Cardiac Doppler Indications:    I48.91* Unspecified atrial fibrillation  History:        Patient has no prior history of Echocardiogram examinations.                 Arrythmias:Atrial Fibrillation; Risk Factors:Hypertension,                 Dyslipidemia and Sleep Apnea.  Sonographer:    Raquel Sarna Senior RDCS Referring Phys: [id number] [place]  1. Left ventricular ejection fraction, by estimation, is 35 to 40%. The left ventricle has moderately decreased function. The left ventricle demonstrates global hypokinesis. There is mild asymmetric left ventricular hypertrophy of the posterior wall segment. Left ventricular diastolic parameters are indeterminate.  2. Right ventricular systolic function is normal. The right  ventricular size is normal. Tricuspid regurgitation signal is inadequate for assessing PA pressure.  3. Left atrial size was severely dilated.  4. The mitral valve is normal in structure. Moderate mitral  valve regurgitation. No evidence of mitral stenosis.  5. The aortic valve is normal in structure. Aortic valve regurgitation is not visualized. No aortic stenosis is present.  6. There is mild dilatation of the aortic root, measuring 37 mm. There is mild dilatation of the ascending aorta, measuring 37 mm.  7. The inferior vena cava is dilated in size with <50% respiratory variability, suggesting right atrial pressure of 15 mmHg. Comparison(s): No prior Echocardiogram. FINDINGS  Left Ventricle: Left ventricular ejection fraction, by estimation, is 35 to 40%. The left ventricle has moderately decreased function. The left ventricle demonstrates global hypokinesis. The left ventricular internal cavity size was normal in size. There is mild asymmetric left ventricular hypertrophy of the posterior wall segment. Left ventricular diastolic parameters are indeterminate. Right Ventricle: The right ventricular size is normal. No increase in right ventricular wall thickness. Right ventricular systolic function is normal. Tricuspid regurgitation signal is inadequate for assessing PA pressure. Left Atrium: Left atrial size was severely dilated. Right Atrium: Right atrial size was normal in size. Pericardium: There is no evidence of pericardial effusion. Mitral Valve: The mitral valve is normal in structure. Moderate mitral valve regurgitation. No evidence of mitral valve stenosis. Tricuspid Valve: The tricuspid valve is normal in structure. Tricuspid valve regurgitation is not demonstrated. No evidence of tricuspid stenosis. Aortic Valve: The aortic valve is normal in structure. Aortic valve regurgitation is not visualized. No aortic stenosis is present. Pulmonic Valve: The pulmonic valve was not well visualized. Pulmonic valve  regurgitation is not visualized. No evidence of pulmonic stenosis. Aorta: The aortic root is normal in size and structure. There is mild dilatation of the aortic root, measuring 37 mm. There is mild dilatation of the ascending aorta, measuring 37 mm. Venous: The inferior vena cava is dilated in size with less than 50% respiratory variability, suggesting right atrial pressure of 15 mmHg. IAS/Shunts: No atrial level shunt detected by color flow Doppler.  LEFT VENTRICLE PLAX 2D LVIDd:         5.40 cm LVIDs:         4.50 cm LV PW:         1.10 cm LV IVS:        0.80 cm LVOT diam:     2.30 cm LV SV:         51 LV SV Index:   24 LVOT Area:     4.15 cm  LV Volumes (MOD) LV vol d, MOD A2C: 135.0 ml LV vol d, MOD A4C: 135.0 ml LV vol s, MOD A2C: 84.0 ml LV vol s, MOD A4C: 92.6 ml LV SV MOD A2C:     51.0 ml LV SV MOD A4C:     135.0 ml LV SV MOD BP:      49.0 ml RIGHT VENTRICLE RV S prime:     9.68 cm/s TAPSE (M-mode): 1.5 cm LEFT ATRIUM              Index        RIGHT ATRIUM           Index LA diam:        5.00 cm  2.31 cm/m   RA Area:     13.50 cm LA Vol (A2C):   107.0 ml 49.43 ml/m  RA Volume:   32.10 ml  14.83 ml/m LA Vol (A4C):   99.2 ml  45.82 ml/m LA Biplane Vol: 111.0 ml 51.27 ml/m  AORTIC VALVE LVOT Vmax:   76.05 cm/s LVOT Vmean:  54.750  cm/s LVOT VTI:    0.123 m  AORTA Ao Root diam: 3.70 cm Ao Asc diam:  3.70 cm  SHUNTS Systemic VTI:  0.12 m Systemic Diam: 2.30 cm Kardie Tobb DO Electronically signed by Berniece Salines DO Signature Date/Time: 06/28/2021/1:50:13 PM    Final    ECHO TEE  Result Date: 07/01/2021    TRANSESOPHOGEAL ECHO REPORT   Patient Name:   Jeffrey Andrews Date of Exam: 07/01/2021 Medical Rec #:  [id number]     Height:       68.0 in Accession #:    [id number]    Weight:       215.0 lb Date of Birth:  [birth year]     BSA:          2.108 m Patient Age:    40 years      BP:           107/64 mmHg Patient Gender: M             HR:           92 bpm. Exam Location:  Inpatient Procedure: Transesophageal Echo,  Color Doppler and Cardiac Doppler Indications:     atrial fibrillation  History:         Patient has prior history of Echocardiogram examinations, most                  recent 06/28/2021. Risk Factors:Hypertension, Dyslipidemia and                  Sleep Apnea.  Sonographer:     Johny Chess RDCS Referring Phys:  [id number] RHONDA G BARRETT Diagnosing Phys: [name] MD PROCEDURE: After discussion of the risks and benefits of a TEE, an informed consent was obtained from the patient. The transesophogeal probe was passed without difficulty through the esophogus of the patient. Sedation performed by different physician. The patient was monitored while under deep sedation. Anesthestetic sedation was provided intravenously by Anesthesiology: 40mg  of Propofol, 204mg  of Lidocaine. Image quality was good. The patient's vital signs; including heart rate, blood pressure, and oxygen saturation; remained stable throughout the procedure. The patient developed no complications during the procedure. A successful direct current cardioversion was performed at 150 joules with 1 attempt. IMPRESSIONS  1. Left ventricular ejection fraction, by estimation, is 20 to 25%. The left ventricle has severely decreased function. The left ventricle demonstrates global hypokinesis.  2. Right ventricular systolic function is moderately reduced. The right ventricular size is normal.  3. No left atrial/left atrial appendage thrombus was detected. The LAA emptying velocity was 57 cm/s.  4. Multiple trivial to mild regurgitant jets. The mitral valve is normal in structure. Mild mitral valve regurgitation. No evidence of mitral stenosis.  5. The aortic valve is tricuspid. Aortic valve regurgitation is trivial. No aortic stenosis is present.  6. Aortic dilatation noted. There is mild dilatation of the ascending aorta, measuring 38 mm.  7. The inferior vena cava is normal in size with greater than 50% respiratory variability, suggesting right atrial  pressure of 3 mmHg. Conclusion(s)/Recommendation(s): Normal biventricular function without evidence of hemodynamically significant valvular heart disease. FINDINGS  Left Ventricle: Left ventricular ejection fraction, by estimation, is 20 to 25%. The left ventricle has severely decreased function. The left ventricle demonstrates global hypokinesis. The left ventricular internal cavity size was normal in size. There is no left ventricular hypertrophy. Right Ventricle: The right ventricular size is normal. No increase in right ventricular wall thickness. Right ventricular systolic  function is moderately reduced. Left Atrium: Left atrial size was normal in size. No left atrial/left atrial appendage thrombus was detected. The LAA emptying velocity was 57 cm/s. Right Atrium: Right atrial size was normal in size. Pericardium: There is no evidence of pericardial effusion. Mitral Valve: Multiple trivial to mild regurgitant jets. The mitral valve is normal in structure. Mild mitral valve regurgitation. No evidence of mitral valve stenosis. Tricuspid Valve: The tricuspid valve is normal in structure. Tricuspid valve regurgitation is not demonstrated. No evidence of tricuspid stenosis. Aortic Valve: The aortic valve is tricuspid. Aortic valve regurgitation is trivial. No aortic stenosis is present. Pulmonic Valve: The pulmonic valve was normal in structure. Pulmonic valve regurgitation is trivial. No evidence of pulmonic stenosis. Aorta: Aortic dilatation noted. There is mild dilatation of the ascending aorta, measuring 38 mm. Venous: The inferior vena cava is normal in size with greater than 50% respiratory variability, suggesting right atrial pressure of 3 mmHg. IAS/Shunts: No atrial level shunt detected by color flow Doppler.  MR Peak grad:    66.8 mmHg MR Mean grad:    42.3 mmHg MR Vmax:         408.67 cm/s MR Vmean:        294.7 cm/s MR PISA:         0.77 cm MR PISA Eff ROA: 8 mm MR PISA Radius:  0.35 cm [name]  MD Electronically signed by [name] MD Signature Date/Time: 07/01/2021/10:06:46 AM    Final     TEE/DCCV   Subjective: Patient was seen and examined at bedside.  Overnight events noted.   Patient reports feeling much improved.  Patient went back to normal sinus rhythm,  cleared from cardiology. He wants to be discharged.   Discharge Exam: Vitals:   07/04/21 0521 07/04/21 0829  BP: 110/70   Pulse: 62   Resp: 16   Temp: 97.7 F (36.5 C)   SpO2: 95% 95%   Vitals:   07/03/21 1957 07/04/21 0500 07/04/21 0521 07/04/21 0829  BP:   110/70   Pulse:   62   Resp:   16   Temp:   97.7 F (36.5 C)   TempSrc:      SpO2: 97%  95% 95%  Weight:  100.2 kg    Height:        General: Pt is alert, awake, not in acute distress Cardiovascular: RRR, S1/S2 +, no rubs, no gallops Respiratory: CTA bilaterally, no wheezing, no rhonchi Abdominal: Soft, NT, ND, bowel sounds + Extremities: no edema, no cyanosis    The results of significant diagnostics from this hospitalization (including imaging, microbiology, ancillary and laboratory) are listed below for reference.     Microbiology: Recent Results (from the past 240 hour(s))  Resp Panel by RT-PCR (Flu A&B, Covid) Nasopharyngeal Swab     Status: None   Collection Time: 06/30/21  4:42 PM   Specimen: Nasopharyngeal Swab; Nasopharyngeal(NP) swabs in vial transport medium  Result Value Ref Range Status   SARS Coronavirus 2 by RT PCR NEGATIVE NEGATIVE Final    Comment: (NOTE) SARS-CoV-2 target nucleic acids are NOT DETECTED.  The SARS-CoV-2 RNA is generally detectable in upper respiratory specimens during the acute phase of infection. The lowest concentration of SARS-CoV-2 viral copies this assay can detect is 138 copies/mL. A negative result does not preclude SARS-Cov-2 infection and should not be used as the sole basis for treatment or other patient management decisions. A negative result may occur with  improper specimen  collection/handling,  submission of specimen other than nasopharyngeal swab, presence of viral mutation(s) within the areas targeted by this assay, and inadequate number of viral copies(<138 copies/mL). A negative result must be combined with clinical observations, patient history, and epidemiological information. The expected result is Negative.  Fact Sheet for Patients:  EntrepreneurPulse.com.au  Fact Sheet for Healthcare Providers:  IncredibleEmployment.be  This test is no t yet approved or cleared by the [place] FDA and  has been authorized for detection and/or diagnosis of SARS-CoV-2 by FDA under an Emergency Use Authorization (EUA). This EUA will remain  in effect (meaning this test can be used) for the duration of the COVID-19 declaration under Section 564(b)(1) of the Act, 21 U.S.C.section 360bbb-3(b)(1), unless the authorization is terminated  or revoked sooner.       Influenza A by PCR NEGATIVE NEGATIVE Final   Influenza B by PCR NEGATIVE NEGATIVE Final    Comment: (NOTE) The Xpert Xpress SARS-CoV-2/FLU/RSV plus assay is intended as an aid in the diagnosis of influenza from Nasopharyngeal swab specimens and should not be used as a sole basis for treatment. Nasal washings and aspirates are unacceptable for Xpert Xpress SARS-CoV-2/FLU/RSV testing.  Fact Sheet for Patients: EntrepreneurPulse.com.au  Fact Sheet for Healthcare Providers: IncredibleEmployment.be  This test is not yet approved or cleared by the [place] FDA and has been authorized for detection and/or diagnosis of SARS-CoV-2 by FDA under an Emergency Use Authorization (EUA). This EUA will remain in effect (meaning this test can be used) for the duration of the COVID-19 declaration under Section 564(b)(1) of the Act, 21 U.S.C. section 360bbb-3(b)(1), unless the authorization is terminated or revoked.  Performed at [facility], [place] [address] [place], [place] [address]      Labs: BNP (last 3 results) Recent Labs    06/27/21 1035  BNP 546.5*   Basic Metabolic Panel: Recent Labs  Lab 06/29/21 0759 06/30/21 0351 07/01/21 0353 07/02/21 0835 07/04/21 0407  NA 135 136 135   135 136 135  K 3.8 3.2* 3.5   3.4* 3.5 3.3*  CL 96* 99 99   99 99 99  CO2 29 26 27   28 25 24   GLUCOSE 131* 135* 133*   137* 184* 124*  BUN 21 22 26*   27* 28* 23  CREATININE 0.85 0.73 0.86   0.82 0.67 0.84  CALCIUM 9.2 9.0 9.1   9.0 8.9 8.7*  MG  --  2.0  --  1.9 2.0  PHOS 3.8 4.5 4.3  --   --    Liver Function Tests: Recent Labs  Lab 06/28/21 0347 06/29/21 0759 06/30/21 0351 07/01/21 0353  AST 23  --   --   --   ALT 12  --   --   --   ALKPHOS 39  --   --   --   BILITOT 1.0  --   --   --   PROT 7.5  --   --   --   ALBUMIN 4.0 4.2 3.7 3.9   No results for input(s): LIPASE, AMYLASE in the last 168 hours. No results for input(s): AMMONIA in the last 168 hours. CBC: Recent Labs  Lab 06/28/21 0347  WBC 6.2  HGB 14.9  HCT 43.0  MCV 95.1  PLT 249   Cardiac Enzymes: No results for input(s): CKTOTAL, CKMB, CKMBINDEX, TROPONINI in the last 168 hours. BNP: Invalid input(s): POCBNP CBG: Recent Labs  Lab 07/03/21 1137 07/03/21 1710 07/03/21 2106 07/04/21 0734 07/04/21  1150  GLUCAP 139* 118* 136* 134* 135*   D-Dimer No results for input(s): DDIMER in the last 72 hours. Hgb A1c No results for input(s): HGBA1C in the last 72 hours. Lipid Profile No results for input(s): CHOL, HDL, LDLCALC, TRIG, CHOLHDL, LDLDIRECT in the last 72 hours. Thyroid function studies No results for input(s): TSH, T4TOTAL, T3FREE, THYROIDAB in the last 72 hours.  Invalid input(s): FREET3 Anemia work up No results for input(s): VITAMINB12, FOLATE, FERRITIN, TIBC, IRON, RETICCTPCT in the last 72 hours. Urinalysis    Component Value Date/Time   COLORURINE YELLOW 06/02/2015 0840   APPEARANCEUR CLEAR  06/02/2015 0840   LABSPEC 1.014 06/02/2015 0840   PHURINE 7.5 06/02/2015 0840   GLUCOSEU NEGATIVE 06/02/2015 0840   HGBUR NEGATIVE 06/02/2015 0840   BILIRUBINUR NEGATIVE 06/02/2015 0840   KETONESUR NEGATIVE 06/02/2015 0840   PROTEINUR NEGATIVE 06/02/2015 0840   UROBILINOGEN 1.0 11/11/2007 1602   NITRITE NEGATIVE 06/02/2015 0840   LEUKOCYTESUR NEGATIVE 06/02/2015 0840   Sepsis Labs Invalid input(s): PROCALCITONIN,  WBC,  LACTICIDVEN Microbiology Recent Results (from the past 240 hour(s))  Resp Panel by RT-PCR (Flu A&B, Covid) Nasopharyngeal Swab     Status: None   Collection Time: 06/30/21  4:42 PM   Specimen: Nasopharyngeal Swab; Nasopharyngeal(NP) swabs in vial transport medium  Result Value Ref Range Status   SARS Coronavirus 2 by RT PCR NEGATIVE NEGATIVE Final    Comment: (NOTE) SARS-CoV-2 target nucleic acids are NOT DETECTED.  The SARS-CoV-2 RNA is generally detectable in upper respiratory specimens during the acute phase of infection. The lowest concentration of SARS-CoV-2 viral copies this assay can detect is 138 copies/mL. A negative result does not preclude SARS-Cov-2 infection and should not be used as the sole basis for treatment or other patient management decisions. A negative result may occur with  improper specimen collection/handling, submission of specimen other than nasopharyngeal swab, presence of viral mutation(s) within the areas targeted by this assay, and inadequate number of viral copies(<138 copies/mL). A negative result must be combined with clinical observations, patient history, and epidemiological information. The expected result is Negative.  Fact Sheet for Patients:  EntrepreneurPulse.com.au  Fact Sheet for Healthcare Providers:  IncredibleEmployment.be  This test is no t yet approved or cleared by the [place] FDA and  has been authorized for detection and/or diagnosis of SARS-CoV-2 by FDA under an  Emergency Use Authorization (EUA). This EUA will remain  in effect (meaning this test can be used) for the duration of the COVID-19 declaration under Section 564(b)(1) of the Act, 21 U.S.C.section 360bbb-3(b)(1), unless the authorization is terminated  or revoked sooner.       Influenza A by PCR NEGATIVE NEGATIVE Final   Influenza B by PCR NEGATIVE NEGATIVE Final    Comment: (NOTE) The Xpert Xpress SARS-CoV-2/FLU/RSV plus assay is intended as an aid in the diagnosis of influenza from Nasopharyngeal swab specimens and should not be used as a sole basis for treatment. Nasal washings and aspirates are unacceptable for Xpert Xpress SARS-CoV-2/FLU/RSV testing.  Fact Sheet for Patients: EntrepreneurPulse.com.au  Fact Sheet for Healthcare Providers: IncredibleEmployment.be  This test is not yet approved or cleared by the [place] FDA and has been authorized for detection and/or diagnosis of SARS-CoV-2 by FDA under an Emergency Use Authorization (EUA). This EUA will remain in effect (meaning this test can be used) for the duration of the COVID-19 declaration under Section 564(b)(1) of the Act, 21 U.S.C. section 360bbb-3(b)(1), unless the authorization is terminated or revoked.  Performed  at [facility], [place] [address], [place], [place] [address]      Time coordinating discharge: Over 30 minutes  SIGNED:   Shawna Clamp, MD  [facility] 07/04/2021, 2:11 PM Pager   If 7PM-7AM, please contact night-coverage

## 2021-07-02 NOTE — Progress Notes (Deleted)
Provided and discussed discharge instructions. Addressed all questions and concerns. IV removed intact.  ?Zadia Uhde N Willies Laviolette ? ?

## 2021-07-02 NOTE — Progress Notes (Signed)
Pt's discharge discontinued by MD.  Jerene Pitch

## 2021-07-02 NOTE — Progress Notes (Addendum)
Progress Note  Patient Name: Jeffrey Andrews Date of Encounter: 07/02/2021  [facility] Cardiologist: Donato Heinz, MD     Subjective   Patient reports that he is feeling significantly better and feels ready to go home. Denies chest pain, SOB, palpitations. Patient usually sees a doctor at the [place], would like to follow up with [place] as his wife is an established patient.   Inpatient Medications    Scheduled Meds:  apixaban  5 mg Oral BID   atorvastatin  20 mg Oral Daily   furosemide  40 mg Oral Daily   levalbuterol  1.25 mg Nebulization BID   metFORMIN  500 mg Oral BID WC   metoprolol succinate  50 mg Oral Daily   sacubitril-valsartan  1 tablet Oral BID   Continuous Infusions:  PRN Meds: acetaminophen **OR** acetaminophen, levalbuterol, loratadine, metoprolol tartrate   Vital Signs    Vitals:   07/01/21 1518 07/01/21 1942 07/02/21 0500 07/02/21 0748  BP: 131/74 134/83 135/88   Pulse: 76 (!) 48 69   Resp: 20 18    Temp: 98.1 F (36.7 C) 98.4 F (36.9 C) 98.1 F (36.7 C)   TempSrc: Oral Oral Oral   SpO2: 96% 96% 97% 96%  Weight:   97.7 kg   Height:        Intake/Output Summary (Last 24 hours) at 07/02/2021 0855 Last data filed at 07/02/2021 0600 Gross per 24 hour  Intake 630 ml  Output 850 ml  Net -220 ml   Last 3 Weights 07/02/2021 07/01/2021 06/30/2021  Weight (lbs) 215 lb 4.8 oz 215 lb 218 lb 7.6 oz  Weight (kg) 97.659 kg 97.523 kg 99.1 kg      Telemetry    Sinus rhythm, very frequent PACs and PVCs, short atrial runs - Personally Reviewed  ECG    No new tracings - Personally Reviewed  Physical Exam   GEN: No acute distress.   Neck: No JVD Cardiac: Tachycardic, regular rhythm. no murmurs, rubs, or gallops.  Respiratory: Clear to auscultation bilaterally. GI: Soft, nontender, non-distended  MS: No edema; No deformity. Radial pulses 2+ bilaterally  Neuro:  Nonfocal  Psych: Normal affect   Labs    High Sensitivity Troponin:   Recent Labs  Lab  06/27/21 1035 06/27/21 1425  TROPONINIHS 10 10     Chemistry Recent Labs  Lab 06/27/21 1035 06/28/21 0347 06/29/21 0759 06/30/21 0351 07/01/21 0353  NA 134* 137 135 136 135   135  K 3.6 3.9 3.8 3.2* 3.5   3.4*  CL 101 103 96* 99 99   99  CO2 23 23 29 26 27   28   GLUCOSE 122* 149* 131* 135* 133*   137*  BUN 12 13 21 22  26*   27*  CREATININE 0.79 0.69 0.85 0.73 0.86   0.82  CALCIUM 8.7* 8.8* 9.2 9.0 9.1   9.0  MG 2.0  --   --  2.0  --   PROT 7.6 7.5  --   --   --   ALBUMIN 4.1 4.0 4.2 3.7 3.9  AST 23 23  --   --   --   ALT 14 12  --   --   --   ALKPHOS 40 39  --   --   --   BILITOT 0.7 1.0  --   --   --   GFRNONAA >60 >60 >60 >60 >60   >60  ANIONGAP 10 11 10 11  9  8    Lipids No results for input(s): CHOL, TRIG, HDL, LABVLDL, LDLCALC, CHOLHDL in the last 168 hours.  Hematology Recent Labs  Lab 06/27/21 1035 06/28/21 0347  WBC 8.5 6.2  RBC 4.62 4.52  HGB 15.2 14.9  HCT 43.8 43.0  MCV 94.8 95.1  MCH 32.9 33.0  MCHC 34.7 34.7  RDW 13.3 13.3  PLT 232 249   Thyroid  Recent Labs  Lab 06/27/21 1035  TSH 2.119    BNP Recent Labs  Lab 06/27/21 1035  BNP 232.0*    DDimer No results for input(s): DDIMER in the last 168 hours.   Radiology    ECHO TEE  Result Date: 07/01/2021    TRANSESOPHOGEAL ECHO REPORT   Patient Name:   [name] Date of Exam: 07/01/2021 Medical Rec #:  [id number]     Height:       68.0 in Accession #:    [id number]    Weight:       215.0 lb Date of Birth:  19-Jul-1945     BSA:          2.108 m Patient Age:    76 years      BP:           107/64 mmHg Patient Gender: M             HR:           92 bpm. Exam Location:  Inpatient Procedure: Transesophageal Echo, Color Doppler and Cardiac Doppler Indications:     atrial fibrillation  History:         Patient has prior history of Echocardiogram examinations, most                  recent 06/28/2021. Risk Factors:Hypertension, Dyslipidemia and                  Sleep Apnea.  Sonographer:     Johny Chess RDCS Referring Phys:  [id number] RHONDA G BARRETT Diagnosing Phys: [name] MD PROCEDURE: After discussion of the risks and benefits of a TEE, an informed consent was obtained from the patient. The transesophogeal probe was passed without difficulty through the esophogus of the patient. Sedation performed by different physician. The patient was monitored while under deep sedation. Anesthestetic sedation was provided intravenously by Anesthesiology: 40mg  of Propofol, 204mg  of Lidocaine. Image quality was good. The patient's vital signs; including heart rate, blood pressure, and oxygen saturation; remained stable throughout the procedure. The patient developed no complications during the procedure. A successful direct current cardioversion was performed at 150 joules with 1 attempt. IMPRESSIONS  1. Left ventricular ejection fraction, by estimation, is 20 to 25%. The left ventricle has severely decreased function. The left ventricle demonstrates global hypokinesis.  2. Right ventricular systolic function is moderately reduced. The right ventricular size is normal.  3. No left atrial/left atrial appendage thrombus was detected. The LAA emptying velocity was 57 cm/s.  4. Multiple trivial to mild regurgitant jets. The mitral valve is normal in structure. Mild mitral valve regurgitation. No evidence of mitral stenosis.  5. The aortic valve is tricuspid. Aortic valve regurgitation is trivial. No aortic stenosis is present.  6. Aortic dilatation noted. There is mild dilatation of the ascending aorta, measuring 38 mm.  7. The inferior vena cava is normal in size with greater than 50% respiratory variability, suggesting right atrial pressure of 3 mmHg. Conclusion(s)/Recommendation(s): Normal biventricular function without evidence of hemodynamically significant valvular heart disease. FINDINGS  Left Ventricle: Left ventricular ejection fraction, by estimation, is 20 to 25%. The left ventricle has severely  decreased function. The left ventricle demonstrates global hypokinesis. The left ventricular internal cavity size was normal in size. There is no left ventricular hypertrophy. Right Ventricle: The right ventricular size is normal. No increase in right ventricular wall thickness. Right ventricular systolic function is moderately reduced. Left Atrium: Left atrial size was normal in size. No left atrial/left atrial appendage thrombus was detected. The LAA emptying velocity was 57 cm/s. Right Atrium: Right atrial size was normal in size. Pericardium: There is no evidence of pericardial effusion. Mitral Valve: Multiple trivial to mild regurgitant jets. The mitral valve is normal in structure. Mild mitral valve regurgitation. No evidence of mitral valve stenosis. Tricuspid Valve: The tricuspid valve is normal in structure. Tricuspid valve regurgitation is not demonstrated. No evidence of tricuspid stenosis. Aortic Valve: The aortic valve is tricuspid. Aortic valve regurgitation is trivial. No aortic stenosis is present. Pulmonic Valve: The pulmonic valve was normal in structure. Pulmonic valve regurgitation is trivial. No evidence of pulmonic stenosis. Aorta: Aortic dilatation noted. There is mild dilatation of the ascending aorta, measuring 38 mm. Venous: The inferior vena cava is normal in size with greater than 50% respiratory variability, suggesting right atrial pressure of 3 mmHg. IAS/Shunts: No atrial level shunt detected by color flow Doppler.  MR Peak grad:    66.8 mmHg MR Mean grad:    42.3 mmHg MR Vmax:         408.67 cm/s MR Vmean:        294.7 cm/s MR PISA:         0.77 cm MR PISA Eff ROA: 8 mm MR PISA Radius:  0.35 cm [name] MD Electronically signed by [name] MD Signature Date/Time: 07/01/2021/10:06:46 AM    Final     Cardiac Studies   Echo 06/28/2021  1. Left ventricular ejection fraction, by estimation, is 35 to 40%. The  left ventricle has moderately decreased function. The left  ventricle  demonstrates global hypokinesis. There is mild asymmetric left ventricular  hypertrophy of the posterior wall  segment. Left ventricular diastolic parameters are indeterminate.   2. Right ventricular systolic function is normal. The right ventricular  size is normal. Tricuspid regurgitation signal is inadequate for assessing  PA pressure.   3. Left atrial size was severely dilated.   4. The mitral valve is normal in structure. Moderate mitral valve  regurgitation. No evidence of mitral stenosis.   5. The aortic valve is normal in structure. Aortic valve regurgitation is  not visualized. No aortic stenosis is present.   6. There is mild dilatation of the aortic root, measuring 37 mm. There is  mild dilatation of the ascending aorta, measuring 37 mm.   7. The inferior vena cava is dilated in size with <50% respiratory  variability, suggesting right atrial pressure of 15 mmHg.   TEE 07/01/2021   1. Left ventricular ejection fraction, by estimation, is 20 to 25%. The  left ventricle has severely decreased function. The left ventricle  demonstrates global hypokinesis.   2. Right ventricular systolic function is moderately reduced. The right  ventricular size is normal.   3. No left atrial/left atrial appendage thrombus was detected. The LAA  emptying velocity was 57 cm/s.   4. Multiple trivial to mild regurgitant jets. The mitral valve is normal  in structure. Mild mitral valve regurgitation. No evidence of mitral  stenosis.   5. The aortic valve is  tricuspid. Aortic valve regurgitation is trivial.  No aortic stenosis is present.   6. Aortic dilatation noted. There is mild dilatation of the ascending  aorta, measuring 38 mm.   7. The inferior vena cava is normal in size with greater than 50%  respiratory variability, suggesting right atrial pressure of 3 mmHg.   Conclusion(s)/Recommendation(s): Normal biventricular function without  evidence of hemodynamically significant  valvular heart disease.   Patient Profile     76 y.o. 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 Currently net -3.6L fluid, weight down to 215 lbs (down from 218). Euvolemic on exam, kidney function stable   - On toprol-xl 50mg .  Added entresto this AM  -Starting on amiodarone as above to try maintain sinus rhythm - Patient will need echo as outpatient in 3 months once sinus rhythm restored.  If still decreased LV function, will need ischemic eval   HTN  - Was on hydrochlorothiazide prior to admission, held now as patient on lasix  - Will not continue HCTZ on discharge as patient will be on BB, entresto  Bronchitis/bilateral lower PNA  - antibiotics managed per IM       For questions or updates, please contact [place] Please consult [email or web address] for contact info under        Signed, Margie Billet, PA-C  07/02/2021, 8:55 AM    Patient seen and examined.  Agree with above documentation.  On exam, patient is alert and oriented, irregular, tachycardic, no murmurs, lungs CTAB, no lower extremity edema or JVD.  Review of telemetry showed that he went back in A-fib with RVR this afternoon, rates up to 130s.  Would recommend loading with amiodarone.  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   Will load with amiodarone and if does not convert to sinus with amiodarone, would plan cardioversion next week to restore sinus rhythm.  His volume status appears significantly improved.  Euvolemic on exam.  Converted to p.o. Lasix.  Donato Heinz, MD

## 2021-07-02 NOTE — Plan of Care (Signed)
  Problem: Nutrition: Goal: Adequate nutrition will be maintained Outcome: Progressing   Problem: Coping: Goal: Level of anxiety will decrease Outcome: Progressing   Problem: Elimination: Goal: Will not experience complications related to bowel motility Outcome: Progressing Goal: Will not experience complications related to urinary retention Outcome: Progressing   

## 2021-07-02 NOTE — Progress Notes (Signed)
PROGRESS NOTE    Jeffrey Andrews  WNI:[id number] DOB: [birth year] DOA: 06/27/2021 PCP: Shirline Frees, MD    Brief Narrative:  This 76 years old male with PMH significant for osteoarthritis, skin cancer of the left arm, glucose intolerance, history of functional heart murmur, hypertension, sleep apnea on CPAP, class I obesity, with BMI 34.8, presented in the ED with c/o: Progressively shortness of breath associated with postnasal drip, dry cough which is occasionally productive,  wheezing and fatigue.  Patient also reports chest pressure that bothers him more with inspiration.  He was found to have atrial fibrillation on EKG with RVR.  Patient was given Cardizem x 2, metoprolol.  Chest x-ray shows findings concerning for developing bronchopneumonia.   Patient was admitted for community-acquired pneumonia and atrial fibrillation with RVR.  Cardiology consulted, started on antibiotics.  TTE shows LVEF 35 to 40%.  Patient underwent successful TEE/DCCV, converted to NSR with PVCs PACs.  Given low EF Patient was started on Entresto before discharge.  Patient again went into A-fib started on amiodarone.  Assessment & Plan:   Principal Problem:   Atrial fibrillation with RVR (HCC) Active Problems:   Sleep apnea syndrome   Bronchial pneumonia   Reactive airway disease   Hypocalcemia   Aortic atherosclerosis (HCC)   Hyperlipidemia   Glucose intolerance   Prolonged QT interval   Acute combined systolic and diastolic heart failure (HCC)   Essential hypertension  New onset atrial fibrillation with RVR: Patient presented with chest pain, shortness of breath on exertion. EKG showed new onset A. fib with RVR, High-sensitivity troponins negative. Continue Eliquis 5 mg p.o. daily. Continue metoprolol 50 mg IV twice daily TTE showed LVEF 35 to 40%. Patient underwent successful TEE/DCCV on 07/01/21.  Converted to NSR with PACs and PVCs. Patient again went into A-fib requiring amiodarone.  Acute  combined systolic and diastolic heart failure: Repeat echo shows LVEF 35 to 40%. Patient has been adequately diuresed with IV Lasix, Appears euvolemic.  Changed to p.o. Lasix 07/01/21. Medication adjusted, started on Entresto.  Prolonged QT interval: Optimize electrolyte. Avoid QT prolonging agents. Repeat EKG showed normal QTC.  Bronchitis / CAP: Continue ceftriaxone and doxycycline. Completed antibiotics for 5 days.  Reactive airway disease: Continue Xopenex nebs 3 times a day. He has received Solu-Medrol 40 mg IV once.  Sleep apnea syndrome: Continue CPAP at bedtime  Hypocalcemia: Improved.  Aortic atherosclerosis: Continue atorvastatin 20 mg daily  Glucose intolerance: Carb modified diet, hemoglobin A1c 5.8  DVT prophylaxis: Eliquis Code Status: Full code. Family Communication: No family at bed side. Disposition Plan:   Status is: Inpatient  Remains inpatient appropriate because:   Admitted for bronchopneumonia requiring IV antibiotics and A. fib with RVR cardiology consulted.  He underwent successful TEE/DCCV for atrial fibrillation, converted to NSR.  Again went into A-fib.  Plan is started on amiodarone if he converts by tomorrow if not then plan for cardioversion on Monday  Consultants:  Cardiology  Procedures:  Echo Antimicrobials:  Anti-infectives (From admission, onward)    Start     Dose/Rate Route Frequency Ordered Stop   06/28/21 2200  doxycycline (VIBRA-TABS) tablet 100 mg        100 mg Oral Every 12 hours 06/28/21 1114 07/01/21 2113   06/27/21 1400  cefTRIAXone (ROCEPHIN) 2 g in sodium chloride 0.9 % 100 mL IVPB        2 g 200 mL/hr over 30 Minutes Intravenous Daily 06/27/21 1335 07/02/21 0926   06/27/21 1400  azithromycin (ZITHROMAX)  tablet 500 mg  Status:  Discontinued        500 mg Oral Daily 06/27/21 1335 06/27/21 1341   06/27/21 1400  doxycycline (VIBRAMYCIN) 100 mg in sodium chloride 0.9 % 250 mL IVPB  Status:  Discontinued        100  mg 125 mL/hr over 120 Minutes Intravenous Every 12 hours 06/27/21 1341 06/28/21 1114        Subjective: Patient was seen and examined at bedside.  Overnight events noted.   Patient reports feeling better.  He underwent TEE/DCCV, converted to normal sinus rhythm. Today morning he again went into A-fib with RVR requiring amiodarone.  Objective: Vitals:   07/02/21 0500 07/02/21 0748 07/02/21 0924 07/02/21 1351  BP: 135/88  (!) 150/74 (!) 148/75  Pulse: 69  97 60  Resp:    18  Temp: 98.1 F (36.7 C)   98.2 F (36.8 C)  TempSrc: Oral   Oral  SpO2: 97% 96%  93%  Weight: 97.7 kg     Height:        Intake/Output Summary (Last 24 hours) at 07/02/2021 1454 Last data filed at 07/02/2021 0600 Gross per 24 hour  Intake 480 ml  Output 300 ml  Net 180 ml   Filed Weights   06/30/21 0500 07/01/21 0700 07/02/21 0500  Weight: 99.1 kg 97.5 kg 97.7 kg    Examination:  General exam: Appears comfortable, not in any acute distress.  Deconditioned. Respiratory system: Clear to auscultation bilaterally,  respiratory effort normal, RR 14. Cardiovascular system: S1 & S2 heard, Irregular rhythm, no murmur Gastrointestinal system: Abdomen is soft, nontender, nondistended, BS+ Central nervous system: Alert and oriented x 3. No focal neurological deficits. Extremities: No edema, no cyanosis, no clubbing. Skin: No rashes, lesions or ulcers Psychiatry: Judgement and insight appear normal. Mood & affect appropriate.     Data Reviewed: I have personally reviewed following labs and imaging studies  CBC: Recent Labs  Lab 06/27/21 1035 06/28/21 0347  WBC 8.5 6.2  NEUTROABS 5.9  --   HGB 15.2 14.9  HCT 43.8 43.0  MCV 94.8 95.1  PLT 232 161   Basic Metabolic Panel: Recent Labs  Lab 06/27/21 1035 06/28/21 0347 06/29/21 0759 06/30/21 0351 07/01/21 0353 07/02/21 0835  NA 134* 137 135 136 135   135 136  K 3.6 3.9 3.8 3.2* 3.5   3.4* 3.5  CL 101 103 96* 99 99   99 99  CO2 23 23 29 26 27    28 25   GLUCOSE 122* 149* 131* 135* 133*   137* 184*  BUN 12 13 21 22  26*   27* 28*  CREATININE 0.79 0.69 0.85 0.73 0.86   0.82 0.67  CALCIUM 8.7* 8.8* 9.2 9.0 9.1   9.0 8.9  MG 2.0  --   --  2.0  --  1.9  PHOS  --   --  3.8 4.5 4.3  --    GFR: Estimated Creatinine Clearance: 89 mL/min (by C-G formula based on SCr of 0.67 mg/dL). Liver Function Tests: Recent Labs  Lab 06/27/21 1035 06/28/21 0347 06/29/21 0759 06/30/21 0351 07/01/21 0353  AST 23 23  --   --   --   ALT 14 12  --   --   --   ALKPHOS 40 39  --   --   --   BILITOT 0.7 1.0  --   --   --   PROT 7.6 7.5  --   --   --  ALBUMIN 4.1 4.0 4.2 3.7 3.9   No results for input(s): LIPASE, AMYLASE in the last 168 hours. No results for input(s): AMMONIA in the last 168 hours. Coagulation Profile: No results for input(s): INR, PROTIME in the last 168 hours. Cardiac Enzymes: No results for input(s): CKTOTAL, CKMB, CKMBINDEX, TROPONINI in the last 168 hours. BNP (last 3 results) No results for input(s): PROBNP in the last 8760 hours. HbA1C: No results for input(s): HGBA1C in the last 72 hours.  CBG: Recent Labs  Lab 07/01/21 1250 07/01/21 1701 07/01/21 1940 07/02/21 0721 07/02/21 1140  GLUCAP 141* 110* 168* 162* 119*   Lipid Profile: No results for input(s): CHOL, HDL, LDLCALC, TRIG, CHOLHDL, LDLDIRECT in the last 72 hours. Thyroid Function Tests: No results for input(s): TSH, T4TOTAL, FREET4, T3FREE, THYROIDAB in the last 72 hours.  Anemia Panel: No results for input(s): VITAMINB12, FOLATE, FERRITIN, TIBC, IRON, RETICCTPCT in the last 72 hours. Sepsis Labs: No results for input(s): PROCALCITON, LATICACIDVEN in the last 168 hours.  Recent Results (from the past 240 hour(s))  Resp Panel by RT-PCR (Flu A&B, Covid) Nasopharyngeal Swab     Status: None   Collection Time: 06/30/21  4:42 PM   Specimen: Nasopharyngeal Swab; Nasopharyngeal(NP) swabs in vial transport medium  Result Value Ref Range Status   SARS  Coronavirus 2 by RT PCR NEGATIVE NEGATIVE Final    Comment: (NOTE) SARS-CoV-2 target nucleic acids are NOT DETECTED.  The SARS-CoV-2 RNA is generally detectable in upper respiratory specimens during the acute phase of infection. The lowest concentration of SARS-CoV-2 viral copies this assay can detect is 138 copies/mL. A negative result does not preclude SARS-Cov-2 infection and should not be used as the sole basis for treatment or other patient management decisions. A negative result may occur with  improper specimen collection/handling, submission of specimen other than nasopharyngeal swab, presence of viral mutation(s) within the areas targeted by this assay, and inadequate number of viral copies(<138 copies/mL). A negative result must be combined with clinical observations, patient history, and epidemiological information. The expected result is Negative.  Fact Sheet for Patients:  EntrepreneurPulse.com.au  Fact Sheet for Healthcare Providers:  IncredibleEmployment.be  This test is no t yet approved or cleared by the [place] FDA and  has been authorized for detection and/or diagnosis of SARS-CoV-2 by FDA under an Emergency Use Authorization (EUA). This EUA will remain  in effect (meaning this test can be used) for the duration of the COVID-19 declaration under Section 564(b)(1) of the Act, 21 U.S.C.section 360bbb-3(b)(1), unless the authorization is terminated  or revoked sooner.       Influenza A by PCR NEGATIVE NEGATIVE Final   Influenza B by PCR NEGATIVE NEGATIVE Final    Comment: (NOTE) The Xpert Xpress SARS-CoV-2/FLU/RSV plus assay is intended as an aid in the diagnosis of influenza from Nasopharyngeal swab specimens and should not be used as a sole basis for treatment. Nasal washings and aspirates are unacceptable for Xpert Xpress SARS-CoV-2/FLU/RSV testing.  Fact Sheet for  Patients: EntrepreneurPulse.com.au  Fact Sheet for Healthcare Providers: IncredibleEmployment.be  This test is not yet approved or cleared by the [place] FDA and has been authorized for detection and/or diagnosis of SARS-CoV-2 by FDA under an Emergency Use Authorization (EUA). This EUA will remain in effect (meaning this test can be used) for the duration of the COVID-19 declaration under Section 564(b)(1) of the Act, 21 U.S.C. section 360bbb-3(b)(1), unless the authorization is terminated or revoked.  Performed at [facility], [address] [place], [place] [address]      Radiology Studies: ECHO TEE  Result Date: 07/01/2021    TRANSESOPHOGEAL ECHO REPORT   Patient Name:   Jeffrey Andrews Date of Exam: 07/01/2021 Medical Rec #:  [id number]     Height:       68.0 in Accession #:    [id number]    Weight:       215.0 lb Date of Birth:  Feb 10, 1946     BSA:          2.108 m Patient Age:    57 years      BP:           107/64 mmHg Patient Gender: M             HR:           92 bpm. Exam Location:  Inpatient Procedure: Transesophageal Echo, Color Doppler and Cardiac Doppler Indications:     atrial fibrillation  History:         Patient has prior history of Echocardiogram examinations, most                  recent 06/28/2021. Risk Factors:Hypertension, Dyslipidemia and                  Sleep Apnea.  Sonographer:     Johny Chess RDCS Referring Phys:  [id number] RHONDA G BARRETT Diagnosing Phys: [name] MD PROCEDURE: After discussion of the risks and benefits of a TEE, an informed consent was obtained from the patient. The transesophogeal probe was passed without difficulty through the esophogus of the patient. Sedation performed by different physician. The patient was monitored while under deep sedation. Anesthestetic sedation was provided intravenously by Anesthesiology: 40mg  of Propofol, 204mg  of Lidocaine. Image quality was good. The  patient's vital signs; including heart rate, blood pressure, and oxygen saturation; remained stable throughout the procedure. The patient developed no complications during the procedure. A successful direct current cardioversion was performed at 150 joules with 1 attempt. IMPRESSIONS  1. Left ventricular ejection fraction, by estimation, is 20 to 25%. The left ventricle has severely decreased function. The left ventricle demonstrates global hypokinesis.  2. Right ventricular systolic function is moderately reduced. The right ventricular size is normal.  3. No left atrial/left atrial appendage thrombus was detected. The LAA emptying velocity was 57 cm/s.  4. Multiple trivial to mild regurgitant jets. The mitral valve is normal in structure. Mild mitral valve regurgitation. No evidence of mitral stenosis.  5. The aortic valve is tricuspid. Aortic valve regurgitation is trivial. No aortic stenosis is present.  6. Aortic dilatation noted. There is mild dilatation of the ascending aorta, measuring 38 mm.  7. The inferior vena cava is normal in size with greater than 50% respiratory variability, suggesting right atrial pressure of 3 mmHg. Conclusion(s)/Recommendation(s): Normal biventricular function without evidence of hemodynamically significant valvular heart disease. FINDINGS  Left Ventricle: Left ventricular ejection fraction, by estimation, is 20 to 25%. The left ventricle has severely decreased function. The left ventricle demonstrates global hypokinesis. The left ventricular internal cavity size was normal in size. There is no left ventricular hypertrophy. Right Ventricle: The right ventricular size is normal. No increase in right ventricular wall thickness. Right ventricular systolic function is moderately reduced. Left Atrium: Left atrial size was normal in size. No left atrial/left atrial appendage thrombus was detected. The LAA emptying velocity was 57 cm/s. Right Atrium: Right atrial size was normal in size.  Pericardium: There  is no evidence of pericardial effusion. Mitral Valve: Multiple trivial to mild regurgitant jets. The mitral valve is normal in structure. Mild mitral valve regurgitation. No evidence of mitral valve stenosis. Tricuspid Valve: The tricuspid valve is normal in structure. Tricuspid valve regurgitation is not demonstrated. No evidence of tricuspid stenosis. Aortic Valve: The aortic valve is tricuspid. Aortic valve regurgitation is trivial. No aortic stenosis is present. Pulmonic Valve: The pulmonic valve was normal in structure. Pulmonic valve regurgitation is trivial. No evidence of pulmonic stenosis. Aorta: Aortic dilatation noted. There is mild dilatation of the ascending aorta, measuring 38 mm. Venous: The inferior vena cava is normal in size with greater than 50% respiratory variability, suggesting right atrial pressure of 3 mmHg. IAS/Shunts: No atrial level shunt detected by color flow Doppler.  MR Peak grad:    66.8 mmHg MR Mean grad:    42.3 mmHg MR Vmax:         408.67 cm/s MR Vmean:        294.7 cm/s MR PISA:         0.77 cm MR PISA Eff ROA: 8 mm MR PISA Radius:  0.35 cm [name] MD Electronically signed by [name] MD Signature Date/Time: 07/01/2021/10:06:46 AM    Final     Scheduled Meds:  amiodarone  150 mg Intravenous Once   apixaban  5 mg Oral BID   atorvastatin  20 mg Oral Daily   furosemide  40 mg Oral Daily   levalbuterol  1.25 mg Nebulization BID   metFORMIN  500 mg Oral BID WC   metoprolol succinate  50 mg Oral Daily   sacubitril-valsartan  1 tablet Oral BID   Continuous Infusions:  amiodarone     Followed by   amiodarone        LOS: 4 days    Time spent: 35 mins    Aixa Corsello, MD [facility]   If 7PM-7AM, please contact night-coverage

## 2021-07-03 LAB — GLUCOSE, CAPILLARY
Glucose-Capillary: 141 mg/dL — ABNORMAL HIGH (ref 70–99)
Glucose-Capillary: 139 mg/dL — ABNORMAL HIGH (ref 70–99)
Glucose-Capillary: 118 mg/dL — ABNORMAL HIGH (ref 70–99)
Glucose-Capillary: 136 mg/dL — ABNORMAL HIGH (ref 70–99)

## 2021-07-03 MED ORDER — POTASSIUM CHLORIDE CRYS ER 20 MEQ PO TBCR
40.0000 meq | EXTENDED_RELEASE_TABLET | Freq: Once | ORAL | Status: AC
  Administered 2021-07-03: 40 meq via ORAL
  Filled 2021-07-03: qty 2

## 2021-07-03 NOTE — Plan of Care (Signed)

## 2021-07-03 NOTE — Progress Notes (Signed)
PROGRESS NOTE    Jeffrey Andrews  LYY:[id number] DOB: 04/21/1946 DOA: 06/27/2021 PCP: Shirline Frees, MD    Brief Narrative:  This 76 years old male with PMH significant for osteoarthritis, skin cancer of the left arm, glucose intolerance, history of functional heart murmur, hypertension, sleep apnea on CPAP, class I obesity, with BMI 34.8, presented in the ED with c/o: Progressively shortness of breath associated with postnasal drip, dry cough which is occasionally productive,  wheezing and fatigue.  Patient also reports chest pressure that bothers him more with inspiration.  He was found to have atrial fibrillation on EKG with RVR.  Patient was given Cardizem x 2, metoprolol.  Chest x-ray shows findings concerning for developing bronchopneumonia.   Patient was admitted for community-acquired pneumonia and atrial fibrillation with RVR.  Cardiology consulted, started on antibiotics.  TTE shows LVEF 35 to 40%.  Patient underwent successful TEE/DCCV, converted to NSR with PVCs PACs.  Given low EF Patient was started on Entresto before discharge.  Patient again went into A-fib started on amiodarone.  Plan is to reattempt DCCV on Monday.  Assessment & Plan:   Principal Problem:   Atrial fibrillation with RVR (HCC) Active Problems:   Sleep apnea syndrome   Bronchial pneumonia   Reactive airway disease   Hypocalcemia   Aortic atherosclerosis (HCC)   Hyperlipidemia   Glucose intolerance   Prolonged QT interval   Acute combined systolic and diastolic heart failure (HCC)   Essential hypertension  New onset atrial fibrillation with RVR: Patient presented with chest pain, shortness of breath on exertion. EKG showed new onset A. fib with RVR, High-sensitivity troponins negative. Continue Eliquis 5 mg p.o. daily. Continue Toprol XL 50 mg  daily TTE showed LVEF 35 to 40%. Patient underwent successful TEE/DCCV on 07/01/21.  Converted to NSR with PACs and PVCs. Patient again went into A-fib  requiring amiodarone gtt. Plan to reattempt DCCV on Monday if he does not convert over the weekend.  Acute combined systolic and diastolic heart failure: Repeat echo shows LVEF 35 to 40%. Patient has been adequately diuresed with IV Lasix, Appears euvolemic.   Changed to p.o. Lasix 07/01/21. Medication adjusted, started on Entresto.  Prolonged QT interval: Optimize electrolyte. Avoid QT prolonging agents. Repeat EKG showed normal QTC.  Bronchitis / CAP: Continue ceftriaxone and doxycycline. Completed antibiotics for 5 days.  Reactive airway disease: Continue Xopenex nebs 3 times a day. He has received Solu-Medrol 40 mg IV once.  Sleep apnea syndrome: Continue CPAP at bedtime  Hypocalcemia: Improved.  Aortic atherosclerosis: Continue atorvastatin 20 mg daily  Glucose intolerance: Carb modified diet, hemoglobin A1c 5.8  DVT prophylaxis: Eliquis Code Status: Full code. Family Communication: No family at bed side. Disposition Plan:   Status is: Inpatient  Remains inpatient appropriate because:   Admitted for bronchopneumonia requiring IV antibiotics and A. fib with RVR cardiology consulted.  He underwent successful TEE/DCCV for atrial fibrillation, converted to NSR.  Again went into A-fib.  Plan is started on amiodarone if he converts over weekend if not then plan for cardioversion on Monday  Consultants:  Cardiology  Procedures:  Echo Antimicrobials:  Anti-infectives (From admission, onward)    Start     Dose/Rate Route Frequency Ordered Stop   06/28/21 2200  doxycycline (VIBRA-TABS) tablet 100 mg        100 mg Oral Every 12 hours 06/28/21 1114 07/01/21 2113   06/27/21 1400  cefTRIAXone (ROCEPHIN) 2 g in sodium chloride 0.9 % 100 mL IVPB  2 g 200 mL/hr over 30 Minutes Intravenous Daily 06/27/21 1335 07/02/21 0926   06/27/21 1400  azithromycin (ZITHROMAX) tablet 500 mg  Status:  Discontinued        500 mg Oral Daily 06/27/21 1335 06/27/21 1341   06/27/21  1400  doxycycline (VIBRAMYCIN) 100 mg in sodium chloride 0.9 % 250 mL IVPB  Status:  Discontinued        100 mg 125 mL/hr over 120 Minutes Intravenous Every 12 hours 06/27/21 1341 06/28/21 1114        Subjective: Patient was seen and examined at bedside.  Overnight events noted.   Patient reports feeling better.  He still remains in atrial fibrillation following DCCV. If he does not convert over the weekend on amiodarone then plan is to do DCCV on Monday.  Objective: Vitals:   07/03/21 0235 07/03/21 0415 07/03/21 0500 07/03/21 0805  BP:  125/80  (!) 145/88  Pulse:  71  63  Resp: 18 16  18   Temp:  98 F (36.7 C)  97.6 F (36.4 C)  TempSrc:  Oral  Oral  SpO2:  97%  97%  Weight:   99.4 kg   Height:        Intake/Output Summary (Last 24 hours) at 07/03/2021 1345 Last data filed at 07/03/2021 1024 Gross per 24 hour  Intake 849.67 ml  Output 550 ml  Net 299.67 ml   Filed Weights   07/01/21 0700 07/02/21 0500 07/03/21 0500  Weight: 97.5 kg 97.7 kg 99.4 kg    Examination:  General exam: Appears comfortable, not in any acute distress. Respiratory system: Clear to auscultation bilaterally,  respiratory effort normal, RR 12. Cardiovascular system: S1 & S2 heard, Irregular rhythm, no murmur Gastrointestinal system: Abdomen is soft, nontender, nondistended, BS+ Central nervous system: Alert and oriented x 3. No focal neurological deficits. Extremities: No edema, no cyanosis, no clubbing. Skin: No rashes, lesions or ulcers Psychiatry: Judgement and insight appear normal. Mood & affect appropriate.     Data Reviewed: I have personally reviewed following labs and imaging studies  CBC: Recent Labs  Lab 06/27/21 1035 06/28/21 0347  WBC 8.5 6.2  NEUTROABS 5.9  --   HGB 15.2 14.9  HCT 43.8 43.0  MCV 94.8 95.1  PLT 232 174   Basic Metabolic Panel: Recent Labs  Lab 06/27/21 1035 06/28/21 0347 06/29/21 0759 06/30/21 0351 07/01/21 0353 07/02/21 0835  NA 134* 137 135 136  135   135 136  K 3.6 3.9 3.8 3.2* 3.5   3.4* 3.5  CL 101 103 96* 99 99   99 99  CO2 23 23 29 26 27   28 25   GLUCOSE 122* 149* 131* 135* 133*   137* 184*  BUN 12 13 21 22  26*   27* 28*  CREATININE 0.79 0.69 0.85 0.73 0.86   0.82 0.67  CALCIUM 8.7* 8.8* 9.2 9.0 9.1   9.0 8.9  MG 2.0  --   --  2.0  --  1.9  PHOS  --   --  3.8 4.5 4.3  --    GFR: Estimated Creatinine Clearance: 89.8 mL/min (by C-G formula based on SCr of 0.67 mg/dL). Liver Function Tests: Recent Labs  Lab 06/27/21 1035 06/28/21 0347 06/29/21 0759 06/30/21 0351 07/01/21 0353  AST 23 23  --   --   --   ALT 14 12  --   --   --   ALKPHOS 40 39  --   --   --  BILITOT 0.7 1.0  --   --   --   PROT 7.6 7.5  --   --   --   ALBUMIN 4.1 4.0 4.2 3.7 3.9   No results for input(s): LIPASE, AMYLASE in the last 168 hours. No results for input(s): AMMONIA in the last 168 hours. Coagulation Profile: No results for input(s): INR, PROTIME in the last 168 hours. Cardiac Enzymes: No results for input(s): CKTOTAL, CKMB, CKMBINDEX, TROPONINI in the last 168 hours. BNP (last 3 results) No results for input(s): PROBNP in the last 8760 hours. HbA1C: No results for input(s): HGBA1C in the last 72 hours.  CBG: Recent Labs  Lab 07/02/21 1140 07/02/21 1708 07/02/21 2055 07/03/21 0735 07/03/21 1137  GLUCAP 119* 117* 139* 141* 139*   Lipid Profile: No results for input(s): CHOL, HDL, LDLCALC, TRIG, CHOLHDL, LDLDIRECT in the last 72 hours. Thyroid Function Tests: No results for input(s): TSH, T4TOTAL, FREET4, T3FREE, THYROIDAB in the last 72 hours.  Anemia Panel: No results for input(s): VITAMINB12, FOLATE, FERRITIN, TIBC, IRON, RETICCTPCT in the last 72 hours. Sepsis Labs: No results for input(s): PROCALCITON, LATICACIDVEN in the last 168 hours.  Recent Results (from the past 240 hour(s))  Resp Panel by RT-PCR (Flu A&B, Covid) Nasopharyngeal Swab     Status: None   Collection Time: 06/30/21  4:42 PM   Specimen:  Nasopharyngeal Swab; Nasopharyngeal(NP) swabs in vial transport medium  Result Value Ref Range Status   SARS Coronavirus 2 by RT PCR NEGATIVE NEGATIVE Final    Comment: (NOTE) SARS-CoV-2 target nucleic acids are NOT DETECTED.  The SARS-CoV-2 RNA is generally detectable in upper respiratory specimens during the acute phase of infection. The lowest concentration of SARS-CoV-2 viral copies this assay can detect is 138 copies/mL. A negative result does not preclude SARS-Cov-2 infection and should not be used as the sole basis for treatment or other patient management decisions. A negative result may occur with  improper specimen collection/handling, submission of specimen other than nasopharyngeal swab, presence of viral mutation(s) within the areas targeted by this assay, and inadequate number of viral copies(<138 copies/mL). A negative result must be combined with clinical observations, patient history, and epidemiological information. The expected result is Negative.  Fact Sheet for Patients:  EntrepreneurPulse.com.au  Fact Sheet for Healthcare Providers:  IncredibleEmployment.be  This test is no t yet approved or cleared by the [place] FDA and  has been authorized for detection and/or diagnosis of SARS-CoV-2 by FDA under an Emergency Use Authorization (EUA). This EUA will remain  in effect (meaning this test can be used) for the duration of the COVID-19 declaration under Section 564(b)(1) of the Act, 21 U.S.C.section 360bbb-3(b)(1), unless the authorization is terminated  or revoked sooner.       Influenza A by PCR NEGATIVE NEGATIVE Final   Influenza B by PCR NEGATIVE NEGATIVE Final    Comment: (NOTE) The Xpert Xpress SARS-CoV-2/FLU/RSV plus assay is intended as an aid in the diagnosis of influenza from Nasopharyngeal swab specimens and should not be used as a sole basis for treatment. Nasal washings and aspirates are unacceptable for  Xpert Xpress SARS-CoV-2/FLU/RSV testing.  Fact Sheet for Patients: EntrepreneurPulse.com.au  Fact Sheet for Healthcare Providers: IncredibleEmployment.be  This test is not yet approved or cleared by the [place] FDA and has been authorized for detection and/or diagnosis of SARS-CoV-2 by FDA under an Emergency Use Authorization (EUA). This EUA will remain in effect (meaning this test can be used) for the duration of the COVID-19  declaration under Section 564(b)(1) of the Act, 21 U.S.C. section 360bbb-3(b)(1), unless the authorization is terminated or revoked.  Performed at [facility], [place] [address], [place], [place] [address]      Radiology Studies: No results found.  Scheduled Meds:  apixaban  5 mg Oral BID   atorvastatin  20 mg Oral Daily   furosemide  40 mg Oral Daily   levalbuterol  1.25 mg Nebulization BID   metFORMIN  500 mg Oral BID WC   metoprolol succinate  50 mg Oral Daily   sacubitril-valsartan  1 tablet Oral BID   Continuous Infusions:  amiodarone 30 mg/hr (07/03/21 0503)      LOS: 5 days    Time spent: 35 mins    Joanthan Hlavacek, MD [facility]   If 7PM-7AM, please contact night-coverage

## 2021-07-03 NOTE — Progress Notes (Signed)
Progress Note  Patient Name: Jeffrey Andrews Date of Encounter: 07/03/2021  [facility] Cardiologist: Donato Heinz, MD     Subjective   Still in afib on iv amiodarone ERAF after TEE/DCC 07/01/21  Inpatient Medications    Scheduled Meds:  apixaban  5 mg Oral BID   atorvastatin  20 mg Oral Daily   furosemide  40 mg Oral Daily   levalbuterol  1.25 mg Nebulization BID   metFORMIN  500 mg Oral BID WC   metoprolol succinate  50 mg Oral Daily   sacubitril-valsartan  1 tablet Oral BID   Continuous Infusions:  amiodarone 30 mg/hr (07/03/21 0503)   PRN Meds: acetaminophen **OR** acetaminophen, levalbuterol, loratadine, metoprolol tartrate   Vital Signs    Vitals:   07/03/21 0235 07/03/21 0415 07/03/21 0500 07/03/21 0805  BP:  125/80  (!) 145/88  Pulse:  71  63  Resp: 18 16  18   Temp:  98 F (36.7 C)  97.6 F (36.4 C)  TempSrc:  Oral  Oral  SpO2:  97%  97%  Weight:   99.4 kg   Height:        Intake/Output Summary (Last 24 hours) at 07/03/2021 0913 Last data filed at 07/03/2021 2376 Gross per 24 hour  Intake 849.67 ml  Output 1050 ml  Net -200.33 ml   Last 3 Weights 07/03/2021 07/02/2021 07/01/2021  Weight (lbs) 219 lb 2.2 oz 215 lb 4.8 oz 215 lb  Weight (kg) 99.4 kg 97.659 kg 97.523 kg      Telemetry    Afib 07/03/2021   ECG    No new tracings - Personally Reviewed  Physical Exam   GEN: No acute distress.   Neck: No JVD Cardiac: Tachycardic, regular rhythm. no murmurs, rubs, or gallops.  Respiratory: Clear to auscultation bilaterally. GI: Soft, nontender, non-distended  MS: No edema; No deformity. Radial pulses 2+ bilaterally  Neuro:  Nonfocal  Psych: Normal affect   Labs    High Sensitivity Troponin:   Recent Labs  Lab 06/27/21 1035 06/27/21 1425  TROPONINIHS 10 10     Chemistry Recent Labs  Lab 06/27/21 1035 06/28/21 0347 06/29/21 0759 06/30/21 0351 07/01/21 0353 07/02/21 0835  NA 134* 137 135 136 135   135 136  K 3.6 3.9 3.8 3.2*  3.5   3.4* 3.5  CL 101 103 96* 99 99   99 99  CO2 23 23 29 26 27   28 25   GLUCOSE 122* 149* 131* 135* 133*   137* 184*  BUN 12 13 21 22  26*   27* 28*  CREATININE 0.79 0.69 0.85 0.73 0.86   0.82 0.67  CALCIUM 8.7* 8.8* 9.2 9.0 9.1   9.0 8.9  MG 2.0  --   --  2.0  --  1.9  PROT 7.6 7.5  --   --   --   --   ALBUMIN 4.1 4.0 4.2 3.7 3.9  --   AST 23 23  --   --   --   --   ALT 14 12  --   --   --   --   ALKPHOS 40 39  --   --   --   --   BILITOT 0.7 1.0  --   --   --   --   GFRNONAA >60 >60 >60 >60 >60   >60 >60  ANIONGAP 10 11 10 11 9   8 12     Lipids No results  for input(s): CHOL, TRIG, HDL, LABVLDL, LDLCALC, CHOLHDL in the last 168 hours.  Hematology Recent Labs  Lab 06/27/21 1035 06/28/21 0347  WBC 8.5 6.2  RBC 4.62 4.52  HGB 15.2 14.9  HCT 43.8 43.0  MCV 94.8 95.1  MCH 32.9 33.0  MCHC 34.7 34.7  RDW 13.3 13.3  PLT 232 249   Thyroid  Recent Labs  Lab 06/27/21 1035  TSH 2.119    BNP Recent Labs  Lab 06/27/21 1035  BNP 232.0*    DDimer No results for input(s): DDIMER in the last 168 hours.   Radiology    ECHO TEE  Result Date: 07/01/2021    TRANSESOPHOGEAL ECHO REPORT   Patient Name:   AMARIYON MAYNES Date of Exam: 07/01/2021 Medical Rec #:  [id number]     Height:       68.0 in Accession #:    [id number]    Weight:       215.0 lb Date of Birth:  December 31, 1945     BSA:          2.108 m Patient Age:    68 years      BP:           107/64 mmHg Patient Gender: M             HR:           92 bpm. Exam Location:  Inpatient Procedure: Transesophageal Echo, Color Doppler and Cardiac Doppler Indications:     atrial fibrillation  History:         Patient has prior history of Echocardiogram examinations, most                  recent 06/28/2021. Risk Factors:Hypertension, Dyslipidemia and                  Sleep Apnea.  Sonographer:     Johny Chess RDCS Referring Phys:  [id number] RHONDA G BARRETT Diagnosing Phys: [name] MD PROCEDURE: After discussion of the risks and benefits of a  TEE, an informed consent was obtained from the patient. The transesophogeal probe was passed without difficulty through the esophogus of the patient. Sedation performed by different physician. The patient was monitored while under deep sedation. Anesthestetic sedation was provided intravenously by Anesthesiology: 40mg  of Propofol, 204mg  of Lidocaine. Image quality was good. The patient's vital signs; including heart rate, blood pressure, and oxygen saturation; remained stable throughout the procedure. The patient developed no complications during the procedure. A successful direct current cardioversion was performed at 150 joules with 1 attempt. IMPRESSIONS  1. Left ventricular ejection fraction, by estimation, is 20 to 25%. The left ventricle has severely decreased function. The left ventricle demonstrates global hypokinesis.  2. Right ventricular systolic function is moderately reduced. The right ventricular size is normal.  3. No left atrial/left atrial appendage thrombus was detected. The LAA emptying velocity was 57 cm/s.  4. Multiple trivial to mild regurgitant jets. The mitral valve is normal in structure. Mild mitral valve regurgitation. No evidence of mitral stenosis.  5. The aortic valve is tricuspid. Aortic valve regurgitation is trivial. No aortic stenosis is present.  6. Aortic dilatation noted. There is mild dilatation of the ascending aorta, measuring 38 mm.  7. The inferior vena cava is normal in size with greater than 50% respiratory variability, suggesting right atrial pressure of 3 mmHg. Conclusion(s)/Recommendation(s): Normal biventricular function without evidence of hemodynamically significant valvular heart disease. FINDINGS  Left Ventricle: Left ventricular ejection fraction, by  estimation, is 20 to 25%. The left ventricle has severely decreased function. The left ventricle demonstrates global hypokinesis. The left ventricular internal cavity size was normal in size. There is no left  ventricular hypertrophy. Right Ventricle: The right ventricular size is normal. No increase in right ventricular wall thickness. Right ventricular systolic function is moderately reduced. Left Atrium: Left atrial size was normal in size. No left atrial/left atrial appendage thrombus was detected. The LAA emptying velocity was 57 cm/s. Right Atrium: Right atrial size was normal in size. Pericardium: There is no evidence of pericardial effusion. Mitral Valve: Multiple trivial to mild regurgitant jets. The mitral valve is normal in structure. Mild mitral valve regurgitation. No evidence of mitral valve stenosis. Tricuspid Valve: The tricuspid valve is normal in structure. Tricuspid valve regurgitation is not demonstrated. No evidence of tricuspid stenosis. Aortic Valve: The aortic valve is tricuspid. Aortic valve regurgitation is trivial. No aortic stenosis is present. Pulmonic Valve: The pulmonic valve was normal in structure. Pulmonic valve regurgitation is trivial. No evidence of pulmonic stenosis. Aorta: Aortic dilatation noted. There is mild dilatation of the ascending aorta, measuring 38 mm. Venous: The inferior vena cava is normal in size with greater than 50% respiratory variability, suggesting right atrial pressure of 3 mmHg. IAS/Shunts: No atrial level shunt detected by color flow Doppler.  MR Peak grad:    66.8 mmHg MR Mean grad:    42.3 mmHg MR Vmax:         408.67 cm/s MR Vmean:        294.7 cm/s MR PISA:         0.77 cm MR PISA Eff ROA: 8 mm MR PISA Radius:  0.35 cm [name] MD Electronically signed by [name] MD Signature Date/Time: 07/01/2021/10:06:46 AM    Final     Cardiac Studies   Echo 06/28/2021  1. Left ventricular ejection fraction, by estimation, is 35 to 40%. The  left ventricle has moderately decreased function. The left ventricle  demonstrates global hypokinesis. There is mild asymmetric left ventricular  hypertrophy of the posterior wall  segment. Left ventricular  diastolic parameters are indeterminate.   2. Right ventricular systolic function is normal. The right ventricular  size is normal. Tricuspid regurgitation signal is inadequate for assessing  PA pressure.   3. Left atrial size was severely dilated.   4. The mitral valve is normal in structure. Moderate mitral valve  regurgitation. No evidence of mitral stenosis.   5. The aortic valve is normal in structure. Aortic valve regurgitation is  not visualized. No aortic stenosis is present.   6. There is mild dilatation of the aortic root, measuring 37 mm. There is  mild dilatation of the ascending aorta, measuring 37 mm.   7. The inferior vena cava is dilated in size with <50% respiratory  variability, suggesting right atrial pressure of 15 mmHg.   TEE 07/01/2021   1. Left ventricular ejection fraction, by estimation, is 20 to 25%. The  left ventricle has severely decreased function. The left ventricle  demonstrates global hypokinesis.   2. Right ventricular systolic function is moderately reduced. The right  ventricular size is normal.   3. No left atrial/left atrial appendage thrombus was detected. The LAA  emptying velocity was 57 cm/s.   4. Multiple trivial to mild regurgitant jets. The mitral valve is normal  in structure. Mild mitral valve regurgitation. No evidence of mitral  stenosis.   5. The aortic valve is tricuspid. Aortic valve regurgitation is trivial.  No aortic stenosis is present.   6. Aortic dilatation noted. There is mild dilatation of the ascending  aorta, measuring 38 mm.   7. The inferior vena cava is normal in size with greater than 50%  respiratory variability, suggesting right atrial pressure of 3 mmHg.   Conclusion(s)/Recommendation(s): Normal biventricular function without  evidence of hemodynamically significant valvular heart disease.   Patient Profile     76 y.o. male with a hx of HTN, OSA on CPAP, OA, skin CA, SEM, obesity, glucose intol, Ao atherosclerosis,  who is being seen 06/28/2021 for the evaluation of atrial fib, RVR at the request of Dr Dwyane Dee  Assessment & Plan    Atrial Fib, RVR: S/p TEE/DCCV 07/01/21 ERAF  - Continue eliquis 5mg  BID  -Toprol  -Continue iv amiodarone -Repeat DCC ? 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 prior to d/c   Acute combined systolic and diastolic heart failure  - Echo this admission showed EF 35-40%, TEE estimated EF 20-25% - On lasix, lopressor and entresto  - K 3.5 supplement - Cr 0.67   HTN  - Well controlled.  Continue current medications and low sodium Dash type diet.     Bronchitis/bilateral lower PNA  - antibiotics managed per IM Rocephin d/c       For questions or updates, please contact [place] Please consult [email or web address] for contact info under        Signed, Jenkins Rouge, MD  07/03/2021, 9:13 AM     Jenkins Rouge, MD

## 2021-07-04 LAB — BASIC METABOLIC PANEL
Anion gap: 12 (ref 5–15)
BUN: 23 mg/dL (ref 8–23)
CO2: 24 mmol/L (ref 22–32)
Calcium: 8.7 mg/dL — ABNORMAL LOW (ref 8.9–10.3)
Chloride: 99 mmol/L (ref 98–111)
Creatinine, Ser: 0.84 mg/dL (ref 0.61–1.24)
GFR, Estimated: >60 mL/min (ref >60)
Glucose, Bld: 124 mg/dL — ABNORMAL HIGH (ref 70–99)
Potassium: 3.3 mmol/L — ABNORMAL LOW (ref 3.5–5.1)
Sodium: 135 mmol/L (ref 135–145)

## 2021-07-04 LAB — GLUCOSE, CAPILLARY
Glucose-Capillary: 134 mg/dL — ABNORMAL HIGH (ref 70–99)
Glucose-Capillary: 135 mg/dL — ABNORMAL HIGH (ref 70–99)

## 2021-07-04 LAB — MAGNESIUM: Magnesium: 2 mg/dL (ref 1.7–2.4)

## 2021-07-04 MED ORDER — AMIODARONE HCL 200 MG PO TABS: 200.0000 mg | ORAL_TABLET | Freq: Every day | ORAL | 0 refills | Status: DC

## 2021-07-04 MED ORDER — AMIODARONE HCL 200 MG PO TABS: ORAL_TABLET | ORAL | 0 refills | Status: DC

## 2021-07-04 MED ORDER — AMIODARONE HCL 200 MG PO TABS
200.0000 mg | ORAL_TABLET | Freq: Every day | ORAL | Status: DC
  Administered 2021-07-04: 200 mg via ORAL
  Filled 2021-07-04: qty 1

## 2021-07-04 MED ORDER — POTASSIUM CHLORIDE CRYS ER 10 MEQ PO TBCR
40.0000 meq | EXTENDED_RELEASE_TABLET | Freq: Once | ORAL | Status: AC
  Administered 2021-07-04: 40 meq via ORAL
  Filled 2021-07-04: qty 4

## 2021-07-04 MED ORDER — POTASSIUM CHLORIDE CRYS ER 20 MEQ PO TBCR
20.0000 meq | EXTENDED_RELEASE_TABLET | Freq: Every day | ORAL | Status: DC
  Administered 2021-07-04: 20 meq via ORAL
  Filled 2021-07-04: qty 1

## 2021-07-04 NOTE — Progress Notes (Signed)
Progress Note  Patient Name: Jeffrey Andrews Date of Encounter: 07/04/2021  [facility] Cardiologist: Donato Heinz, MD     Subjective   Converted to NSR this am   Inpatient Medications    Scheduled Meds:  apixaban  5 mg Oral BID   atorvastatin  20 mg Oral Daily   furosemide  40 mg Oral Daily   levalbuterol  1.25 mg Nebulization BID   metFORMIN  500 mg Oral BID WC   metoprolol succinate  50 mg Oral Daily   sacubitril-valsartan  1 tablet Oral BID   Continuous Infusions:  amiodarone 30 mg/hr (07/04/21 0535)   PRN Meds: acetaminophen **OR** acetaminophen, levalbuterol, loratadine, metoprolol tartrate   Vital Signs    Vitals:   07/03/21 1957 07/04/21 0500 07/04/21 0521 07/04/21 0829  BP:   110/70   Pulse:   62   Resp:   16   Temp:   97.7 F (36.5 C)   TempSrc:      SpO2: 97%  95% 95%  Weight:  100.2 kg    Height:        Intake/Output Summary (Last 24 hours) at 07/04/2021 1023 Last data filed at 07/04/2021 0535 Gross per 24 hour  Intake 801.72 ml  Output 1600 ml  Net -798.28 ml   Last 3 Weights 07/04/2021 07/03/2021 07/02/2021  Weight (lbs) 221 lb 219 lb 2.2 oz 215 lb 4.8 oz  Weight (kg) 100.245 kg 99.4 kg 97.659 kg      Telemetry    Afib 07/04/2021   ECG    No new tracings - Personally Reviewed  Physical Exam   GEN: No acute distress.   Neck: No JVD Cardiac: Tachycardic, regular rhythm. no murmurs, rubs, or gallops.  Respiratory: Clear to auscultation bilaterally. GI: Soft, nontender, non-distended  MS: No edema; No deformity. Radial pulses 2+ bilaterally  Neuro:  Nonfocal  Psych: Normal affect   Labs    High Sensitivity Troponin:   Recent Labs  Lab 06/27/21 1035 06/27/21 1425  TROPONINIHS 10 10     Chemistry Recent Labs  Lab 06/27/21 1035 06/28/21 0347 06/29/21 0759 06/30/21 0351 07/01/21 0353 07/02/21 0835 07/04/21 0407  NA 134* 137 135 136 135   135 136 135  K 3.6 3.9 3.8 3.2* 3.5   3.4* 3.5 3.3*  CL 101 103 96* 99 99   99  99 99  CO2 23 23 29 26 27   28 25 24   GLUCOSE 122* 149* 131* 135* 133*   137* 184* 124*  BUN 12 13 21 22  26*   27* 28* 23  CREATININE 0.79 0.69 0.85 0.73 0.86   0.82 0.67 0.84  CALCIUM 8.7* 8.8* 9.2 9.0 9.1   9.0 8.9 8.7*  MG 2.0  --   --  2.0  --  1.9 2.0  PROT 7.6 7.5  --   --   --   --   --   ALBUMIN 4.1 4.0 4.2 3.7 3.9  --   --   AST 23 23  --   --   --   --   --   ALT 14 12  --   --   --   --   --   ALKPHOS 40 39  --   --   --   --   --   BILITOT 0.7 1.0  --   --   --   --   --   GFRNONAA >60 >60 >60 >60 >60   >  60 >60 >60  ANIONGAP 10 11 10 11 9   8 12 12     Lipids No results for input(s): CHOL, TRIG, HDL, LABVLDL, LDLCALC, CHOLHDL in the last 168 hours.  Hematology Recent Labs  Lab 06/27/21 1035 06/28/21 0347  WBC 8.5 6.2  RBC 4.62 4.52  HGB 15.2 14.9  HCT 43.8 43.0  MCV 94.8 95.1  MCH 32.9 33.0  MCHC 34.7 34.7  RDW 13.3 13.3  PLT 232 249   Thyroid  Recent Labs  Lab 06/27/21 1035  TSH 2.119    BNP Recent Labs  Lab 06/27/21 1035  BNP 232.0*    DDimer No results for input(s): DDIMER in the last 168 hours.   Radiology    No results found.  Cardiac Studies   Echo 06/28/2021  1. Left ventricular ejection fraction, by estimation, is 35 to 40%. The  left ventricle has moderately decreased function. The left ventricle  demonstrates global hypokinesis. There is mild asymmetric left ventricular  hypertrophy of the posterior wall  segment. Left ventricular diastolic parameters are indeterminate.   2. Right ventricular systolic function is normal. The right ventricular  size is normal. Tricuspid regurgitation signal is inadequate for assessing  PA pressure.   3. Left atrial size was severely dilated.   4. The mitral valve is normal in structure. Moderate mitral valve  regurgitation. No evidence of mitral stenosis.   5. The aortic valve is normal in structure. Aortic valve regurgitation is  not visualized. No aortic stenosis is present.   6. There is mild  dilatation of the aortic root, measuring 37 mm. There is  mild dilatation of the ascending aorta, measuring 37 mm.   7. The inferior vena cava is dilated in size with <50% respiratory  variability, suggesting right atrial pressure of 15 mmHg.   TEE 07/01/2021   1. Left ventricular ejection fraction, by estimation, is 20 to 25%. The  left ventricle has severely decreased function. The left ventricle  demonstrates global hypokinesis.   2. Right ventricular systolic function is moderately reduced. The right  ventricular size is normal.   3. No left atrial/left atrial appendage thrombus was detected. The LAA  emptying velocity was 57 cm/s.   4. Multiple trivial to mild regurgitant jets. The mitral valve is normal  in structure. Mild mitral valve regurgitation. No evidence of mitral  stenosis.   5. The aortic valve is tricuspid. Aortic valve regurgitation is trivial.  No aortic stenosis is present.   6. Aortic dilatation noted. There is mild dilatation of the ascending  aorta, measuring 38 mm.   7. The inferior vena cava is normal in size with greater than 50%  respiratory variability, suggesting right atrial pressure of 3 mmHg.   Conclusion(s)/Recommendation(s): Normal biventricular function without  evidence of hemodynamically significant valvular heart disease.   Patient Profile     76 y.o. male with a hx of HTN, OSA on CPAP, OA, skin CA, SEM, obesity, glucose intol, Ao atherosclerosis, who is being seen 06/28/2021 for the evaluation of atrial fib, RVR at the request of Dr Dwyane Dee  Assessment & Plan    Atrial Fib, RVR: S/p TEE/DCCV 07/01/21 ERAF  - Continue eliquis 5mg  BID  -Toprol  -converted with iv amiodarone Change to 200 mg bid for a week then 200 mg daily will arrange 30 day monitor and f/u with CHF/Afib clinic and Dr Gardiner Rhyme   Acute combined systolic and diastolic heart failure  - Echo this admission showed EF 35-40%, TEE estimated EF  20-25% - On lasix, lopressor and entresto   - K 3.3  supplement - Cr 0.84   HTN  - Well controlled.  Continue current medications and low sodium Dash type diet.     Bronchitis/bilateral lower PNA  - antibiotics managed per IM Rocephin d/c     Ok to d/c home today   For questions or updates, please contact [place] Please consult [email or web address] for contact info under        Signed, Jenkins Rouge, MD  07/04/2021, 10:23 AM     Jenkins Rouge, MD

## 2021-07-05 ENCOUNTER — Encounter (HOSPITAL_COMMUNITY): Payer: Self-pay | Admitting: Cardiovascular Disease

## 2021-07-13 ENCOUNTER — Ambulatory Visit (HOSPITAL_COMMUNITY)
Admit: 2021-07-13 | Discharge: 2021-07-13 | Disposition: A | Discharge status: Home / Self Care | Payer: Medicare Other | Attending: Nurse Practitioner | Admitting: Nurse Practitioner

## 2021-07-13 ENCOUNTER — Encounter (HOSPITAL_COMMUNITY): Payer: Self-pay | Admitting: Nurse Practitioner

## 2021-07-13 ENCOUNTER — Other Ambulatory Visit: Payer: Self-pay

## 2021-07-13 VITALS — BP 126/68 | HR 99 | Ht 68.0 in | Wt 223.6 lb

## 2021-07-13 DIAGNOSIS — E7439 Other disorders of intestinal carbohydrate absorption: Secondary | ICD-10-CM | POA: Insufficient documentation

## 2021-07-13 DIAGNOSIS — I4891 Unspecified atrial fibrillation: Secondary | ICD-10-CM | POA: Diagnosis not present

## 2021-07-13 DIAGNOSIS — M1990 Unspecified osteoarthritis, unspecified site: Secondary | ICD-10-CM | POA: Diagnosis not present

## 2021-07-13 DIAGNOSIS — I110 Hypertensive heart disease with heart failure: Secondary | ICD-10-CM | POA: Diagnosis not present

## 2021-07-13 DIAGNOSIS — Z7901 Long term (current) use of anticoagulants: Secondary | ICD-10-CM | POA: Insufficient documentation

## 2021-07-13 DIAGNOSIS — E669 Obesity, unspecified: Secondary | ICD-10-CM | POA: Insufficient documentation

## 2021-07-13 DIAGNOSIS — I5041 Acute combined systolic (congestive) and diastolic (congestive) heart failure: Secondary | ICD-10-CM | POA: Insufficient documentation

## 2021-07-13 DIAGNOSIS — Z85828 Personal history of other malignant neoplasm of skin: Secondary | ICD-10-CM | POA: Diagnosis not present

## 2021-07-13 DIAGNOSIS — D6869 Other thrombophilia: Secondary | ICD-10-CM | POA: Diagnosis not present

## 2021-07-13 DIAGNOSIS — Z6834 Body mass index (BMI) 34.0-34.9, adult: Secondary | ICD-10-CM | POA: Diagnosis not present

## 2021-07-13 DIAGNOSIS — G4730 Sleep apnea, unspecified: Secondary | ICD-10-CM | POA: Insufficient documentation

## 2021-07-13 DIAGNOSIS — Z79899 Other long term (current) drug therapy: Secondary | ICD-10-CM | POA: Diagnosis not present

## 2021-07-13 DIAGNOSIS — I4819 Other persistent atrial fibrillation: Secondary | ICD-10-CM | POA: Diagnosis not present

## 2021-07-13 MED ORDER — AMIODARONE HCL 200 MG PO TABS: ORAL_TABLET | ORAL | 0 refills | Status: DC

## 2021-07-13 NOTE — Progress Notes (Addendum)
Primary Care Physician: Pcp, No Referring Physician: [facility] f/u    Jeffrey Andrews is a 76 y.o. male with a h/o  osteoarthritis, skin cancer of the left arm, glucose intolerance, history of functional heart murmur, hypertension, sleep apnea on CPAP, class I obesity, with BMI 34.8, presented in the ED with c/o: progressive shortness of breath associated with postnasal drip, dry cough which is occasionally productive,  wheezing and fatigue.  Patient also reports chest pressure that bothers him more with inspiration.  He was found to have atrial fibrillation on EKG with RVR.  Patient was given Cardizem x 2, metoprolol. Chest x-ray shows findings concerning for developing bronchopneumonia.    Patient was admitted for community-acquired pneumonia and atrial fibrillation with RVR.  Cardiology consulted, started on antibiotics.  TTE shows LVEF 35 to 40%.  Patient underwent successful TEE/DCCV, converted to NSR with PVCs PACs.  Given low EF, patient was started on Entresto. Patient completed antibiotics for 5 days.  Patient was supposed to be discharged but then he went into A-fib with RVR.  Patient was started on amiodarone gtt. after 24 hours. Patient was cleared from cardiology to be discharged on amiodarone 200 mg twice daily for 1 week followed by 200 mg daily.   Pt now in afib clinic. 07/13/21  and unfortunately  has returned to afib, rate controlled. We discussed increasing amiodarone for several more weeks and then pursing another cardioversion and he is in agreement. Important to restore SR with [facility]. He is unaware he is in afib. His weight is stable. He feels his pneumonia is now resolved. No missed anticoagulation.   Today, he denies symptoms of palpitations, chest pain, shortness of breath, orthopnea, PND, lower extremity edema, dizziness, presyncope, syncope, or neurologic sequela. The patient is tolerating medications without difficulties and is otherwise without complaint today.   Past Medical  History:  Diagnosis Date   Arthritis    Cancer ([place]) 2014   skin cancer left arm   Excessive sweating    Glucose intolerance 06/27/2021   Heart murmur    "functional heart murmur"   History of kidney stones    History of skin cancer    Hypertension    Sleep apnea    uses c-pap   Past Surgical History:  Procedure Laterality Date   BACK SURGERY     x3 last back surgery 1984   CARDIOVERSION N/A 07/01/2021   Procedure: CARDIOVERSION;  Surgeon: [name], MD;  Location: [place];  Service: Cardiovascular;  Laterality: N/A;   EYE SURGERY     cataracts with lens implants   STERIOD INJECTION Right 06/08/2015   Procedure: STEROID INJECTION;  Surgeon: Gaynelle Arabian, MD;  Location: [facility];  Service: Orthopedics;  Laterality: Right;   TEE WITHOUT CARDIOVERSION N/A 07/01/2021   Procedure: TRANSESOPHAGEAL ECHOCARDIOGRAM (TEE);  Surgeon: [name], MD;  Location: [place];  Service: Cardiovascular;  Laterality: N/A;   TENDON REPAIR Left 2000   knee   TONSILLECTOMY     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     amiodarone (PACERONE) 200 MG tablet Advised to take amiodarone 200 mg twice daily for 1 week and then 200 mg daily until follow-up with cardiologist. (Patient taking differently: Take 200 mg by mouth daily.) 45 tablet 0   apixaban (ELIQUIS) 5 MG TABS tablet Take 1 tablet (5 mg total) by mouth 2 (two) times daily. 60 tablet 6   atorvastatin (LIPITOR) 20 MG tablet Take 20 mg by mouth daily.      furosemide (LASIX) 40 MG tablet Take 1 tablet (40 mg total) by mouth daily. 30 tablet 1   ipratropium (ATROVENT) 0.03 % nasal spray Place 1 spray into both nostrils daily as needed for rhinitis.     loratadine (CLARITIN) 10 MG tablet Take 10 mg by mouth daily as needed for allergies or rhinitis.     metFORMIN (GLUCOPHAGE) 500 MG tablet Take 500 mg by mouth 2 (two) times daily with a meal.     metoprolol succinate (TOPROL-XL) 50 MG 24 hr tablet Take 1 tablet (50 mg total) by mouth daily. Take with or immediately following a meal. 30 tablet 1   Multiple Vitamin (MULTI-VITAMIN) tablet Take 1 tablet by mouth daily.     Omega-3 Fatty Acids (FISH OIL) 1000 MG CAPS Take 1 capsule by mouth 2 (two) times daily.     sacubitril-valsartan (ENTRESTO) 24-26 MG Take 1 tablet by mouth 2 (two) times daily. 60 tablet 2   No current facility-administered medications for this encounter.    No Known Allergies  Social History   Socioeconomic History   Marital status: Married    Spouse name: Not on file   Number of children: Not on file   Years of education: Not on file   Highest education level: Not on file  Occupational History   Not on file  Tobacco Use   Smoking status: Never   Smokeless tobacco: Former    Quit date: 10/29/1992  Substance and Sexual Activity   Alcohol use: No   Drug use: No   Sexual activity: Not on file  Other Topics Concern   Not on file  Social History Narrative   Not on file   Social Determinants of Health   Financial Resource Strain: Not on file  Food Insecurity: Not on file  Transportation Needs: Not on file  Physical Activity: Not on file  Stress: Not on file  Social Connections: Not on file  Intimate Partner Violence: Not on file    Family History  Problem Relation Age of Onset   Hypertension Mother    Hypertension Father    Diabetes type II Sister    Kidney disease Other    Brain cancer Paternal Uncle    Heart attack Maternal Uncle     ROS- All  systems are reviewed and negative except as per the HPI above  Physical Exam: Vitals:   07/13/21 0850  Pulse: 99  Weight: 101.4 kg  Height: 5\' 8"  (1.727 m)   Wt Readings from Last 3 Encounters:  07/13/21 101.4 kg  07/04/21 100.2 kg  11/09/15 106.6 kg    Labs: Lab Results  Component Value Date   NA 135 07/04/2021   K 3.3 (L) 07/04/2021   CL 99 07/04/2021   CO2 24 07/04/2021   GLUCOSE 124 (H) 07/04/2021   BUN 23 07/04/2021   CREATININE 0.84 07/04/2021   CALCIUM 8.7 (L) 07/04/2021   PHOS 4.3 07/01/2021   MG 2.0 07/04/2021   Lab Results  Component Value  Date   INR 1.03 06/02/2015   No results found for: CHOL, HDL, LDLCALC, TRIG   GEN- The patient is well appearing, alert and oriented x 3 today.   Head- normocephalic, atraumatic Eyes-  Sclera clear, conjunctiva pink Ears- hearing intact Oropharynx- clear Neck- supple, no JVP Lymph- no cervical lymphadenopathy Lungs- Clear to ausculation bilaterally, normal work of breathing Heart- irregular rate and rhythm, no murmurs, rubs or gallops, PMI not laterally displaced GI- soft, NT, ND, + BS Extremities- no clubbing, cyanosis, or edema MS- no significant deformity or atrophy Skin- no rash or lesion Psych- euthymic mood, full affect Neuro- strength and sensation are intact  EKG-afib at 90 bpm, qrs int 98 ms, qtc 487 ms   Echo- 1. Left ventricular ejection fraction, by estimation, is 35 to 40%. The  left ventricle has moderately decreased function. The left ventricle  demonstrates global hypokinesis. There is mild asymmetric left ventricular  hypertrophy of the posterior wall  segment. Left ventricular diastolic parameters are indeterminate.   2. Right ventricular systolic function is normal. The right ventricular  size is normal. Tricuspid regurgitation signal is inadequate for assessing  PA pressure.   3. Left atrial size was severely dilated.   4. The mitral valve is normal in structure. Moderate mitral valve   regurgitation. No evidence of mitral stenosis.   5. The aortic valve is normal in structure. Aortic valve regurgitation is  not visualized. No aortic stenosis is present.   6. There is mild dilatation of the aortic root, measuring 37 mm. There is  mild dilatation of the ascending aorta, measuring 37 mm.   7. The inferior vena cava is dilated in size with <50% respiratory  variability, suggesting right atrial pressure of 15 mmHg.   Assessment and Plan:  1. Persistent afib General education re afib management  Found in the setting of pneumonia, now resolved   Increase amiodarone to 200 mg bid and will attempt repeat cardioversion in 3 weeks, then will reduce amio to 200 mg daily   2. Acute systolic/diastolic HF Continue entresto/BB, lasix  at current doses  Weight daily Avoid salt Inform office of sudden weight gain   3. HTN Stable   4. CHA2DS2VASc  score of  at least 4 Continue eliquis 5 mg bid  Reminded not to miss any doses for the upcoming cardioversion 3/6  I will see back in one week for EKG, lab week for upcoming cardioversion Has f/u with Jory Sims, NP,  3/17, for HF and as well can f/u cardioversion  Afib clinic as needed   [place]. Arti Trang, [facility] [address] [place],  [address] [phone]

## 2021-07-13 NOTE — Patient Instructions (Signed)
Increase Amiodarone 200mg  twice a day until day of cardioversion then reduce back to once a day  Cardioversion scheduled for Monday, March 6th  - Arrive at the [facility] and go to admitting at 730AM  - Do not eat or drink anything after midnight the night prior to your procedure.  - Take all your morning medication (except diabetic medications) with a sip of water prior to arrival.  - You will not be able to drive home after your procedure.  - Do NOT miss any doses of your blood thinner - if you should miss a dose please notify our office immediately.  - If you feel as if you go back into normal rhythm prior to scheduled cardioversion, please notify our office immediately. If your procedure is canceled in the cardioversion suite you will be charged a cancellation fee.

## 2021-07-20 ENCOUNTER — Other Ambulatory Visit: Payer: Self-pay

## 2021-07-20 ENCOUNTER — Ambulatory Visit (HOSPITAL_COMMUNITY)
Admission: RE | Admit: 2021-07-20 | Discharge: 2021-07-20 | Disposition: A | Discharge status: Home / Self Care | Payer: Medicare Other | Source: Ambulatory Visit | Attending: Nurse Practitioner | Admitting: Nurse Practitioner

## 2021-07-20 DIAGNOSIS — D6869 Other thrombophilia: Secondary | ICD-10-CM

## 2021-07-20 DIAGNOSIS — R9431 Abnormal electrocardiogram [ECG] [EKG]: Secondary | ICD-10-CM | POA: Diagnosis not present

## 2021-07-20 DIAGNOSIS — Z79899 Other long term (current) drug therapy: Secondary | ICD-10-CM | POA: Insufficient documentation

## 2021-07-20 DIAGNOSIS — I4891 Unspecified atrial fibrillation: Secondary | ICD-10-CM | POA: Diagnosis not present

## 2021-07-20 LAB — CBC
HCT: 42.3 % (ref 39.0–52.0)
Hemoglobin: 15 g/dL (ref 13.0–17.0)
MCH: 33.3 pg (ref 26.0–34.0)
MCHC: 35.5 g/dL (ref 30.0–36.0)
MCV: 93.8 fL (ref 80.0–100.0)
Platelets: 235 10*3/uL (ref 150–400)
RBC: 4.51 MIL/uL (ref 4.22–5.81)
RDW: 12.9 % (ref 11.5–15.5)
WBC: 7.9 10*3/uL (ref 4.0–10.5)
nRBC: 0 % (ref 0.0–0.2)

## 2021-07-20 LAB — BASIC METABOLIC PANEL
Anion gap: 12 (ref 5–15)
BUN: 15 mg/dL (ref 8–23)
CO2: 22 mmol/L (ref 22–32)
Calcium: 8.5 mg/dL — ABNORMAL LOW (ref 8.9–10.3)
Chloride: 102 mmol/L (ref 98–111)
Creatinine, Ser: 0.87 mg/dL (ref 0.61–1.24)
GFR, Estimated: >60 mL/min (ref >60)
Glucose, Bld: 108 mg/dL — ABNORMAL HIGH (ref 70–99)
Potassium: 5.6 mmol/L — ABNORMAL HIGH (ref 3.5–5.1)
Sodium: 136 mmol/L (ref 135–145)

## 2021-07-20 NOTE — Patient Instructions (Signed)
Continue Amiodarone 200mg  twice a day until day of cardioversion then reduce back to once a day   Cardioversion scheduled for Monday, March 6th             - Arrive at the [facility] and go to admitting at 730AM             - Do not eat or drink anything after midnight the night prior to your procedure.             - Take all your morning medication (except diabetic medications) with a sip of water prior to arrival.             - You will not be able to drive home after your procedure.             - Do NOT miss any doses of your blood thinner - if you should miss a dose please notify our office immediately.             - If you feel as if you go back into normal rhythm prior to scheduled cardioversion, please notify our office immediately. If your procedure is canceled in the cardioversion suite you will be charged a cancellation fee.

## 2021-07-20 NOTE — Progress Notes (Signed)
Pt in for pending cardioversion 3/6. In today as he has been reloading  on amiodarone 200 mg bid since 2/14 and then will reduce to 200 mg am of cardioversion.He remains in afib rate controlled. Labs drawn today as well for pending cardioversion    Vent. rate 88 BPM PR interval * ms QRS duration 96 ms QT/QTcB 408/493 ms P-R-T axes * 52 65 Atrial fibrillation Prolonged QT Abnormal ECG When compared with ECG of 13-Jul-2021 09:19, PREVIOUS ECG IS PRESENT

## 2021-07-21 ENCOUNTER — Other Ambulatory Visit (HOSPITAL_COMMUNITY): Payer: Self-pay | Admitting: *Deleted

## 2021-07-21 DIAGNOSIS — I4819 Other persistent atrial fibrillation: Secondary | ICD-10-CM

## 2021-07-23 ENCOUNTER — Encounter (HOSPITAL_COMMUNITY): Payer: Self-pay | Admitting: Cardiology

## 2021-07-23 NOTE — Progress Notes (Signed)
Attempted to obtain medical history via telephone, unable to reach at this time. I left a voicemail to return pre surgical testing department's phone call.  

## 2021-08-01 ENCOUNTER — Encounter (HOSPITAL_COMMUNITY): Payer: Self-pay | Admitting: Anesthesiology

## 2021-08-01 NOTE — Anesthesia Preprocedure Evaluation (Deleted)
Anesthesia Evaluation  ? ? ?Airway ? ? ? ? ? ? ? Dental ?  ?Pulmonary ?sleep apnea and Continuous Positive Airway Pressure Ventilation ,  ?  ? ? ? ? ? ? ? Cardiovascular ?hypertension, Pt. on medications and Pt. on home beta blockers ?+ dysrhythmias Atrial Fibrillation  ? ? ?  ?Neuro/Psych ?negative neurological ROS ? negative psych ROS  ? GI/Hepatic ?negative GI ROS, Neg liver ROS,   ?Endo/Other  ?diabetes, Type 2, Oral Hypoglycemic Agents ? Renal/GU ?negative Renal ROS  ?negative genitourinary ?  ?Musculoskeletal ? ?(+) Arthritis , Osteoarthritis,   ? Abdominal ?  ?Peds ? Hematology ?negative hematology ROS ?(+)   ?Anesthesia Other Findings ? ? Reproductive/Obstetrics ? ?  ? ? ? ? ? ? ? ? ? ? ? ? ? ?  ?  ? ? ? ? ? ? ? ? ?Anesthesia Physical ?Anesthesia Plan ? ?ASA: 4 ? ?Anesthesia Plan: General  ? ?Post-op Pain Management:   ? ?Induction: Intravenous ? ?PONV Risk Score and Plan: 2 and Propofol infusion and Treatment may vary due to age or medical condition ? ?Airway Management Planned: Mask ? ?Additional Equipment: None ? ?Intra-op Plan:  ? ?Post-operative Plan:  ? ?Informed Consent:  ? ?Plan Discussed with:  ? ?Anesthesia Plan Comments: (ECHO 02/23: ??1. Left ventricular ejection fraction, by estimation, is 20 to 25%. The  ?left ventricle has severely decreased function. The left ventricle  ?demonstrates global hypokinesis.  ??2. Right ventricular systolic function is moderately reduced. The right  ?ventricular size is normal.  ??3. No left atrial/left atrial appendage thrombus was detected. The LAA  ?emptying velocity was 57 cm/s.  ??4. Multiple trivial to mild regurgitant jets. The mitral valve is normal  ?in structure. Mild mitral valve regurgitation. No evidence of mitral  ?stenosis.  ??5. The aortic valve is tricuspid. Aortic valve regurgitation is trivial.  ?No aortic stenosis is present.  ??6. Aortic dilatation noted. There is mild dilatation of the ascending  ?aorta,  measuring 38 mm.  ??7. The inferior vena cava is normal in size with greater than 50%  ?respiratory variability, suggesting right atrial pressure of 3 mmHg.  ?Lab Results ?     Component                Value               Date                 ?     WBC                      7.9                 07/20/2021           ?     HGB                      15.0                07/20/2021           ?     HCT                      42.3                07/20/2021           ?     MCV  93.8                07/20/2021           ?     PLT                      235                 07/20/2021           ?Lab Results ?     Component                Value               Date                 ?     NA                       136                 07/20/2021           ?     K                        5.6 (H)             07/20/2021           ?     CO2                      22                  07/20/2021           ?     GLUCOSE                  108 (H)             07/20/2021           ?     BUN                      15                  07/20/2021           ?     CREATININE               0.87                07/20/2021           ?     CALCIUM                  8.5 (L)             07/20/2021           ?     GFRNONAA                 >60                 07/20/2021          )  ? ? ? ? ?Anesthesia Quick Evaluation ? ?

## 2021-08-02 ENCOUNTER — Other Ambulatory Visit: Payer: Self-pay

## 2021-08-02 ENCOUNTER — Encounter (HOSPITAL_COMMUNITY)
Admission: RE | Disposition: A | Discharge status: Home / Self Care | Payer: Self-pay | Source: Home / Self Care | Attending: Cardiology

## 2021-08-02 ENCOUNTER — Ambulatory Visit (HOSPITAL_COMMUNITY)
Admission: RE | Admit: 2021-08-02 | Discharge: 2021-08-02 | Disposition: A | Discharge status: Home / Self Care | Payer: Medicare Other | Attending: Cardiology | Admitting: Cardiology

## 2021-08-02 DIAGNOSIS — Z7901 Long term (current) use of anticoagulants: Secondary | ICD-10-CM | POA: Diagnosis not present

## 2021-08-02 DIAGNOSIS — I4891 Unspecified atrial fibrillation: Secondary | ICD-10-CM | POA: Diagnosis not present

## 2021-08-02 DIAGNOSIS — I493 Ventricular premature depolarization: Secondary | ICD-10-CM | POA: Diagnosis not present

## 2021-08-02 DIAGNOSIS — I4819 Other persistent atrial fibrillation: Secondary | ICD-10-CM

## 2021-08-02 LAB — POCT I-STAT, CHEM 8
BUN: 18 mg/dL (ref 8–23)
Calcium, Ion: 0.98 mmol/L — ABNORMAL LOW (ref 1.15–1.40)
Chloride: 104 mmol/L (ref 98–111)
Creatinine, Ser: 0.9 mg/dL (ref 0.61–1.24)
Glucose, Bld: 135 mg/dL — ABNORMAL HIGH (ref 70–99)
HCT: 42 % (ref 39.0–52.0)
Hemoglobin: 14.3 g/dL (ref 13.0–17.0)
Potassium: 3.5 mmol/L (ref 3.5–5.1)
Sodium: 141 mmol/L (ref 135–145)
TCO2: 31 mmol/L (ref 22–32)

## 2021-08-02 SURGERY — CANCELLED PROCEDURE

## 2021-08-02 MED ORDER — SODIUM CHLORIDE 0.9 % IV SOLN: INTRAVENOUS | Status: DC

## 2021-08-02 NOTE — Progress Notes (Signed)
Pt presented today for DCCV in NSR, confirmed by EKG; MD Tobb aware  ?

## 2021-08-02 NOTE — H&P (Signed)
The patient presented for cardioversion, however at the time of the procedure the patient was in sinus rhythm.  Therefore procedure was not performed. ? ?I explained to the patient he was in sinus rhythm.  There is no need for cardioversion.  He was continue his current medication for his atrial fibrillation amiodarone 200 mg daily as well as Eliquis 5 mg daily.  He will follow-up with his primary cardiologist as scheduled. ? ?

## 2021-08-10 ENCOUNTER — Telehealth: Payer: Self-pay

## 2021-08-10 NOTE — Telephone Encounter (Signed)
Called patient to advise appointment for 3/17 with K. Purcell Nails will need to be rescheduled. Left message for patient to call office. ?

## 2021-08-11 ENCOUNTER — Other Ambulatory Visit (HOSPITAL_COMMUNITY): Payer: Self-pay | Admitting: Nurse Practitioner

## 2021-08-13 ENCOUNTER — Ambulatory Visit: Payer: Medicare Other | Admitting: Adult Health

## 2021-09-03 ENCOUNTER — Telehealth: Payer: Self-pay | Admitting: Cardiology

## 2021-09-03 MED ORDER — FUROSEMIDE 40 MG PO TABS: 40.0000 mg | ORAL_TABLET | Freq: Every day | ORAL | 0 refills | Status: DC

## 2021-09-03 MED ORDER — METOPROLOL SUCCINATE ER 50 MG PO TB24: 50.0000 mg | ORAL_TABLET | Freq: Every day | ORAL | 0 refills | Status: DC

## 2021-09-03 NOTE — Telephone Encounter (Signed)
?*  STAT* If patient is at the pharmacy, call can be transferred to refill team. ? ? ?1. Which medications need to be refilled? (please list name of each medication and dose if known)  ?metoprolol succinate (TOPROL-XL) 50 MG 24 hr tablet ?furosemide (LASIX) 40 MG tablet ? ?2. Which pharmacy/location (including street and city if local pharmacy) is medication to be sent to? [id number] [place] [place] [address]? ?3. Do they need a 30 day or 90 day supply? 30 day ? ?Patient is out of medication.   ?

## 2021-09-03 NOTE — Telephone Encounter (Signed)
Prescriptions sent to pharmacy-appt with Almyra Deforest PA 4/11 ? ?Appt 3/17 got rescheduled due to provider schedule change.  ? ?Left message for patient to make aware (ok per DPR).  ?

## 2021-09-03 NOTE — Telephone Encounter (Signed)
Okay to refill but he needs to be seen, looks like he has appointment on 4/11 with Isaac Laud ?

## 2021-09-07 ENCOUNTER — Ambulatory Visit: Payer: Medicare Other | Admitting: Physician Assistant

## 2021-09-07 ENCOUNTER — Encounter: Payer: Self-pay | Admitting: Physician Assistant

## 2021-09-07 VITALS — BP 116/68 | HR 63 | Ht 68.0 in | Wt 228.8 lb

## 2021-09-07 DIAGNOSIS — I4819 Other persistent atrial fibrillation: Secondary | ICD-10-CM

## 2021-09-07 DIAGNOSIS — I5022 Chronic systolic (congestive) heart failure: Secondary | ICD-10-CM

## 2021-09-07 NOTE — Patient Instructions (Addendum)
Medication Instructions:  ?Your physician recommends that you continue on your current medications as directed. Please refer to the Current Medication list given to you today. ? ?*If you need a refill on your cardiac medications before your next appointment, please call your pharmacy* ? ?Lab Work: ?NONE ordered at this time of appointment  ? ?If you have labs (blood work) drawn today and your tests are completely normal, you will receive your results only by: ?MyChart Message (if you have MyChart) OR ?A paper copy in the mail ?If you have any lab test that is abnormal or we need to change your treatment, we will call you to review the results. ? ?Testing/Procedures: ?Your physician has requested that you have an echocardiogram. Echocardiography is a painless test that uses sound waves to create images of your heart. It provides your doctor with information about the size and shape of your heart and how well your heart?s chambers and valves are working. This procedure takes approximately one hour. There are no restrictions for this procedure. ? ?Please schedule for Early June 2023 ? ?Follow-Up: ?At [facility], you and your health needs are our priority.  As part of our continuing mission to provide you with exceptional heart care, we have created designated Provider Care Teams.  These Care Teams include your primary Cardiologist (physician) and Advanced Practice Providers (APPs -  Physician Assistants and Nurse Practitioners) who all work together to provide you with the care you need, when you need it. ? ?We recommend signing up for the patient portal called "MyChart".  Sign up information is provided on this After Visit Summary.  MyChart is used to connect with patients for Virtual Visits (Telemedicine).  Patients are able to view lab/test results, encounter notes, upcoming appointments, etc.  Non-urgent messages can be sent to your provider as well.   ?To learn more about what you can do with MyChart, go to  [email or web address].   ? ?Your next appointment:   ?2 month(s) after Echo in June 2023 ? ?The format for your next appointment:   ?In Person ? ?Provider:   ?Donato Heinz, MD   ? ?Other Instructions ? ? ? ?Important Information About Sugar ? ? ? ? ? ? ?

## 2021-09-07 NOTE — Progress Notes (Signed)
?Cardiology Office Note:   ? ?Date:  09/09/2021  ? ?ID:  Jeffrey Andrews, DOB Apr 25, 1946, Jeffrey Andrews ? ?PCP:  Pcp, No ?  ?[facility] Providers ?Cardiologist:  Donato Heinz, MD    ? ?Referring MD: Shirline Frees, MD  ? ?Chief Complaint  ?Patient presents with  ? Follow-up  ?  Seen for Dr. Gardiner Rhyme  ? ? ?History of Present Illness:   ? ?Jeffrey Andrews is a 76 y.o. male with a hx of OSA on CPAP, hypertension, functional heart murmur, glucose intolerance and history of osteoarthritis.  Patient was previously seen in the hospital in January 2023 when he presented with progressive shortness of breath associated with postnasal drip and found to be in atrial fibrillation with RVR.  He was treated with Cardizem x2, metoprolol.  Chest x-ray concerning for developing bronchopneumonia.  He was treated with antibiotic for community-acquired pneumonia.  Cardiology service was consulted.  TTE showed EF 35 to 40%.  Patient ultimately underwent successful TEE/DCCV with conversion to sinus rhythm.  Given low EF, patient was started on Entresto.  Unfortunately prior to discharge, he had recurrence of atrial fibrillation with RVR and was started on amiodarone therapy.  He was seen in the A-fib clinic for posthospital follow-up on 07/13/2021, he was in atrial fibrillation at the time however was rate controlled.  By the time he arrived in the hospital on 08/02/2021 for the scheduled repeat cardioversion, he was already back in sinus rhythm, therefore procedure was canceled. ? ?Patient presents today for follow-up.  He has made still maintaining sinus rhythm based on today's EKG.  He denies any chest discomfort or worsening dyspnea.  He should have a repeat limited echocardiogram in early June to reassess his ejection fraction.  He can follow-up afterward with Dr. Gardiner Rhyme. ? ? ?Past Medical History:  ?Diagnosis Date  ? Arthritis   ? Cancer [facility]) 2014  ? skin cancer left arm  ? Excessive sweating   ? Glucose intolerance  06/27/2021  ? Heart murmur   ? "functional heart murmur"  ? History of kidney stones   ? History of skin cancer   ? Hypertension   ? Sleep apnea   ? uses c-pap  ? ? ?Past Surgical History:  ?Procedure Laterality Date  ? BACK SURGERY    ? x3 last back surgery 1984  ? CARDIOVERSION N/A 07/01/2021  ? Procedure: CARDIOVERSION;  Surgeon: [name], MD;  Location: [place];  Service: Cardiovascular;  Laterality: N/A;  ? EYE SURGERY    ? cataracts with lens implants  ? STERIOD INJECTION Right 06/08/2015  ? Procedure: STEROID INJECTION;  Surgeon: Gaynelle Arabian, MD;  [facility]: [facility];  Service: Orthopedics;  Laterality: Right;  ? TEE WITHOUT CARDIOVERSION N/A 07/01/2021  ? Procedure: TRANSESOPHAGEAL ECHOCARDIOGRAM (TEE);  Surgeon: [name], MD;  Location: [place];  Service: Cardiovascular;  Laterality: N/A;  ? TENDON REPAIR Left 2000  ? knee  ? TONSILLECTOMY    ? TOTAL KNEE ARTHROPLASTY Left 06/08/2015  ? Procedure: TOTAL KNEE ARTHROPLASTY;  Surgeon: Gaynelle Arabian, MD;  [facility];  Service: Orthopedics;  Laterality: Left;  ? TOTAL KNEE ARTHROPLASTY Right 11/09/2015  ? Procedure: RIGHT TOTAL KNEE ARTHROPLASTY;  Surgeon: Gaynelle Arabian, MD;  [facility];  Service: Orthopedics;  Laterality: Right;  ? WRIST SURGERY  2008  ? left  ? ? ?Current Medications: ?Current Meds  ?Medication Sig  ? acetaminophen (TYLENOL) 500 MG tablet Take 500 mg by mouth every 8 (eight) hours as needed  for moderate pain (For pain.).  ? amiodarone (PACERONE) 200 MG tablet Take 1 tablet (200 mg total) by mouth daily.  ? apixaban (ELIQUIS) 5 MG TABS tablet Take 1 tablet (5 mg total) by mouth 2 (two) times daily.  ? atorvastatin (LIPITOR) 40 MG tablet Take 40 mg by mouth daily. Take 1 tablet by mouth daily.  ? empagliflozin (JARDIANCE) 25 MG TABS tablet Take 25 mg by mouth daily. Take 1/2 tablet by mouth daily.  ? furosemide (LASIX) 40 MG tablet Take 1 tablet (40 mg total) by mouth daily.  ? loratadine (CLARITIN) 10 MG tablet Take  10 mg by mouth daily as needed for allergies or rhinitis.  ? metoprolol succinate (TOPROL-XL) 50 MG 24 hr tablet Take 1 tablet (50 mg total) by mouth daily. Take with or immediately following a meal.  ? Multiple Vitamin (MULTI-VITAMIN) tablet Take 1 tablet by mouth daily.  ? Omega-3 Fatty Acids (FISH OIL) 1000 MG CAPS Take 1,000 mg by mouth 2 (two) times daily.  ? sacubitril-valsartan (ENTRESTO) 24-26 MG Take 1 tablet by mouth 2 (two) times daily.  ?  ? ?Allergies:   Patient has no known allergies.  ? ?Social History  ? ?Socioeconomic History  ? Marital status: Married  ?  Spouse name: Not on file  ? Number of children: Not on file  ? Years of education: Not on file  ? Highest education level: Not on file  ?Occupational History  ? Not on file  ?Tobacco Use  ? Smoking status: Never  ? Smokeless tobacco: Former  ?  Quit date: 10/29/1992  ?Substance and Sexual Activity  ? Alcohol use: No  ? Drug use: No  ? Sexual activity: Not on file  ?Other Topics Concern  ? Not on file  ?Social History Narrative  ? Not on file  ? ?Social Determinants of Health  ? ?Financial Resource Strain: Not on file  ?Food Insecurity: Not on file  ?Transportation Needs: Not on file  ?Physical Activity: Not on file  ?Stress: Not on file  ?Social Connections: Not on file  ?  ? ?Family History: ?The patient's family history includes Brain cancer in his paternal uncle; Diabetes type II in his sister; Heart attack in his maternal uncle; Hypertension in his father and mother; Kidney disease in an other family member. ? ?ROS:   ?Please see the history of present illness.    ? All other systems reviewed and are negative. ? ?EKGs/Labs/Other Studies Reviewed:   ? ?The following studies were reviewed today: ? ?Echo 06/28/2021 ? 1. Left ventricular ejection fraction, by estimation, is 35 to 40%. The  ?left ventricle has moderately decreased function. The left ventricle  ?demonstrates global hypokinesis. There is mild asymmetric left ventricular  ?hypertrophy of  the posterior wall  ?segment. Left ventricular diastolic parameters are indeterminate.  ? 2. Right ventricular systolic function is normal. The right ventricular  ?size is normal. Tricuspid regurgitation signal is inadequate for assessing  ?PA pressure.  ? 3. Left atrial size was severely dilated.  ? 4. The mitral valve is normal in structure. Moderate mitral valve  ?regurgitation. No evidence of mitral stenosis.  ? 5. The aortic valve is normal in structure. Aortic valve regurgitation is  ?not visualized. No aortic stenosis is present.  ? 6. There is mild dilatation of the aortic root, measuring 37 mm. There is  ?mild dilatation of the ascending aorta, measuring 37 mm.  ? 7. The inferior vena cava is dilated in size with <50% respiratory  ?  variability, suggesting right atrial pressure of 15 mmHg.  ? ?Comparison(s): No prior Echocardiogram.  ? ?EKG:  EKG is ordered today.  The ekg ordered today demonstrates normal sinus rhythm, no significant ST-T wave changes. ? ?Recent Labs: ?06/27/2021: B Natriuretic Peptide 232.0; TSH 2.119 ?06/28/2021: ALT 12 ?07/04/2021: Magnesium 2.0 ?07/20/2021: Platelets 235 ?08/02/2021: BUN 18; Creatinine, Ser 0.90; Hemoglobin 14.3; Potassium 3.5; Sodium 141  ?Recent Lipid Panel ?No results found for: CHOL, TRIG, HDL, CHOLHDL, VLDL, LDLCALC, LDLDIRECT ? ? ?Risk Assessment/Calculations:   ? ?CHA2DS2-VASc Score = 5  ? This indicates a 7.2% annual risk of stroke. ?The patient's score is based upon: ?CHF History: 0 ?HTN History: 1 ?Diabetes History: 1 ?Stroke History: 0 ?Vascular Disease History: 1 ?Age Score: 2 ?Gender Score: 0 ?  ? ? ?    ? ?Physical Exam:   ? ?VS:  BP 116/68   Pulse 63   Ht '5\' 8"'$  (1.727 m)   Wt 228 lb 12.8 oz (103.8 kg)   SpO2 98%   BMI 34.79 kg/m?    ? ?Wt Readings from Last 3 Encounters:  ?09/07/21 228 lb 12.8 oz (103.8 kg)  ?08/02/21 226 lb (102.5 kg)  ?07/13/21 223 lb 9.6 oz (101.4 kg)  ?  ? ?GEN:  Well nourished, well developed in no acute distress ?HEENT: Normal ?NECK:  No JVD; No carotid bruits ?LYMPHATICS: No lymphadenopathy ?CARDIAC: RRR, no murmurs, rubs, gallops ?RESPIRATORY:  Clear to auscultation without rales, wheezing or rhonchi  ?ABDOMEN: Soft, non-tender, non-diste

## 2021-09-09 ENCOUNTER — Encounter: Payer: Self-pay | Admitting: Physician Assistant

## 2021-09-25 ENCOUNTER — Other Ambulatory Visit: Payer: Self-pay | Admitting: Cardiology

## 2021-09-27 MED ORDER — FUROSEMIDE 40 MG PO TABS: 40.0000 mg | ORAL_TABLET | Freq: Every day | ORAL | 1 refills | Status: DC

## 2021-09-27 MED ORDER — METOPROLOL SUCCINATE ER 50 MG PO TB24: 50.0000 mg | ORAL_TABLET | Freq: Every day | ORAL | 1 refills | Status: DC

## 2021-10-28 ENCOUNTER — Encounter (INDEPENDENT_AMBULATORY_CARE_PROVIDER_SITE_OTHER): Payer: Self-pay

## 2021-10-28 DIAGNOSIS — Z9889 Other specified postprocedural states: Secondary | ICD-10-CM | POA: Diagnosis not present

## 2021-10-28 DIAGNOSIS — E119 Type 2 diabetes mellitus without complications: Secondary | ICD-10-CM | POA: Diagnosis not present

## 2021-10-28 DIAGNOSIS — H43811 Vitreous degeneration, right eye: Secondary | ICD-10-CM | POA: Diagnosis not present

## 2021-10-28 DIAGNOSIS — Z961 Presence of intraocular lens: Secondary | ICD-10-CM | POA: Diagnosis not present

## 2021-10-28 DIAGNOSIS — H353132 Nonexudative age-related macular degeneration, bilateral, intermediate dry stage: Secondary | ICD-10-CM | POA: Diagnosis not present

## 2021-11-02 ENCOUNTER — Ambulatory Visit (HOSPITAL_COMMUNITY): Payer: Medicare Other | Attending: Internal Medicine

## 2021-11-02 DIAGNOSIS — I5022 Chronic systolic (congestive) heart failure: Secondary | ICD-10-CM | POA: Insufficient documentation

## 2021-11-02 LAB — ECHOCARDIOGRAM LIMITED
Area-P 1/2: 2.29 cm2
S' Lateral: 3.7 cm

## 2022-01-18 NOTE — Progress Notes (Unsigned)
Cardiology Office Note:    Date:  01/19/2022   ID:  Jeffrey Andrews, DOB Sep 05, 1945, MRN [id number]  PCP:  Pcp, No  Cardiologist:  Donato Heinz, MD  Electrophysiologist:  None   Referring MD: No ref. provider found   Chief Complaint  Patient presents with   Atrial Fibrillation    History of Present Illness:    Jeffrey Andrews is a 76 y.o. male with a hx of persistent atrial fibrillation, OSA, hypertension who presents for follow-up.  He was admitted to [facility] 05/2021 with shortness of breath, found to be in atrial fibrillation with RVR.  Echocardiogram 06/28/2021 showed EF 35 to 40%.  Underwent successful TEE/DCCV with conversion to sinus rhythm.  He had recurrence of A-fib with RVR during admission and was started on amiodarone.  Repeat cardioversion scheduled for 08/02/2021 was canceled, as he converted back to sinus rhythm on amiodarone.  Repeat echocardiogram 11/02/2021 showed EF 50 to 55%, moderate LVH, grade 1 diastolic dysfunction, normal RV function, mild MR, mild dilatation of aortic root measuring 40 mm.  Recent labs on 01/06/2022 at [facility] showed LDL 70, A1c 6.6, hemoglobin 15.6, creatinine 0.9, potassium 3.3, sodium 135.  CT chest on 11/12/2021 showed no acute findings but noted to have extensive coronary artery calcifications.  Brought home BP log, has been 120s to 140s over 50s to 70s.  Since last clinic visit, he reports he has been doing well.  Does report having dyspnea on exertion, particular when walking up stairs.  He denies any chest pain.  Denies any lightheadedness, syncope, lower extremity edema, palpitations.  He had 1 episode earlier this month where his heart rate was up to 118.  He is not exercising.  He takes Eliquis, denies any bleeding issues.    Past Medical History:  Diagnosis Date   Arthritis    Cancer ([place]) 2014   skin cancer left arm   Excessive sweating    Glucose intolerance 06/27/2021   Heart murmur    "functional heart murmur"   History of kidney stones     History of skin cancer    Hypertension    Sleep apnea    uses c-pap    Past Surgical History:  Procedure Laterality Date   BACK SURGERY     x3 last back surgery 1984   CARDIOVERSION N/A 07/01/2021   Procedure: CARDIOVERSION;  Surgeon: Jeffrey Latch, MD;  Location: [place];  Service: Cardiovascular;  Laterality: N/A;   EYE SURGERY     cataracts with lens implants   STERIOD INJECTION Right 06/08/2015   Procedure: STEROID INJECTION;  Surgeon: Jeffrey Arabian, MD;  [facility];  Service: Orthopedics;  Laterality: Right;   TEE WITHOUT CARDIOVERSION N/A 07/01/2021   Procedure: TRANSESOPHAGEAL ECHOCARDIOGRAM (TEE);  Surgeon: Jeffrey Latch, MD;  Location: [place];  Service: Cardiovascular;  Laterality: N/A;   TENDON REPAIR Left 2000   knee   TONSILLECTOMY     TOTAL KNEE ARTHROPLASTY Left 06/08/2015   Procedure: TOTAL KNEE ARTHROPLASTY;  Surgeon: Jeffrey Arabian, MD;  [facility];  Service: Orthopedics;  Laterality: Left;   TOTAL KNEE ARTHROPLASTY Right 11/09/2015   Procedure: RIGHT TOTAL KNEE ARTHROPLASTY;  Surgeon: Jeffrey Arabian, MD;  [facility];  Service: Orthopedics;  Laterality: Right;   WRIST SURGERY  2008   left    Current Medications: Current Meds  Medication Sig   acetaminophen (TYLENOL) 500 MG tablet Take 500 mg by mouth every 8 (eight) hours as needed for moderate pain (For pain.).  amiodarone (PACERONE) 200 MG tablet Take 1 tablet (200 mg total) by mouth daily.   apixaban (ELIQUIS) 5 MG TABS tablet Take 1 tablet (5 mg total) by mouth 2 (two) times daily.   atorvastatin (LIPITOR) 40 MG tablet Take 40 mg by mouth daily. Take 1 tablet by mouth daily.   empagliflozin (JARDIANCE) 25 MG TABS tablet Take 25 mg by mouth daily. Take 1/2 tablet by mouth daily.   loratadine (CLARITIN) 10 MG tablet Take 10 mg by mouth daily as needed for allergies or rhinitis.   metoprolol succinate (TOPROL-XL) 50 MG 24 hr tablet Take 1 tablet (50 mg total) by mouth daily. Take  with or immediately following a meal.   Multiple Vitamin (MULTI-VITAMIN) tablet Take 1 tablet by mouth daily.   Omega-3 Fatty Acids (FISH OIL) 1000 MG CAPS Take 1,000 mg by mouth 2 (two) times daily.   sacubitril-valsartan (ENTRESTO) 24-26 MG Take 1 tablet by mouth 2 (two) times daily.   [DISCONTINUED] furosemide (LASIX) 40 MG tablet Take 1 tablet (40 mg total) by mouth daily.     Allergies:   Patient has no known allergies.   Social History   Socioeconomic History   Marital status: Married    Spouse name: Not on file   Number of children: Not on file   Years of education: Not on file   Highest education level: Not on file  Occupational History   Not on file  Tobacco Use   Smoking status: Never   Smokeless tobacco: Former    Quit date: 10/29/1992  Substance and Sexual Activity   Alcohol use: No   Drug use: No   Sexual activity: Not on file  Other Topics Concern   Not on file  Social History Narrative   Not on file   Social Determinants of Health   Financial Resource Strain: Not on file  Food Insecurity: Not on file  Transportation Needs: Not on file  Physical Activity: Not on file  Stress: Not on file  Social Connections: Not on file     Family History: The patient's family history includes Brain cancer in his paternal uncle; Diabetes type II in his sister; Heart attack in his maternal uncle; Hypertension in his father and mother; Kidney disease in an other family member.  ROS:   Please see the history of present illness.     All other systems reviewed and are negative.  EKGs/Labs/Other Studies Reviewed:    The following studies were reviewed today:  EKG:   01/19/2022: Normal sinus rhythm, rate 73, no ST abnormality  Recent Labs: 06/27/2021: B Natriuretic Peptide 232.0; TSH 2.119 06/28/2021: ALT 12 07/04/2021: Magnesium 2.0 07/20/2021: Platelets 235 08/02/2021: BUN 18; Creatinine, Ser 0.90; Hemoglobin 14.3; Potassium 3.5; Sodium 141  Recent Lipid Panel No results  found for: "CHOL", "TRIG", "HDL", "CHOLHDL", "VLDL", "LDLCALC", "LDLDIRECT"  Physical Exam:    VS:  BP 138/70   Pulse 73   Ht '5\' 8"'$  (1.727 m)   Wt 231 lb 12.8 oz (105.1 kg)   SpO2 97%   BMI 35.25 kg/m     Wt Readings from Last 3 Encounters:  01/19/22 231 lb 12.8 oz (105.1 kg)  09/07/21 228 lb 12.8 oz (103.8 kg)  08/02/21 226 lb (102.5 kg)     GEN:  Well nourished, well developed in no acute distress HEENT: Normal NECK: No JVD; No carotid bruits LYMPHATICS: No lymphadenopathy CARDIAC: RRR, no murmurs, rubs, gallops RESPIRATORY:  Clear to auscultation without rales, wheezing or rhonchi  ABDOMEN:  Soft, non-tender, non-distended MUSCULOSKELETAL:  No edema; No deformity  SKIN: Warm and dry NEUROLOGIC:  Alert and oriented x 3 PSYCHIATRIC:  Normal affect   ASSESSMENT:    1. Persistent atrial fibrillation ([place])   2. Chronic combined systolic and diastolic heart failure ([place])   3. Shortness of breath   4. Essential hypertension   5. Hyperlipidemia, unspecified hyperlipidemia type    PLAN:    Persistent atrial fibrillation: admitted to [facility] 05/2021 with shortness of breath, found to be in atrial fibrillation with RVR.  Echocardiogram 06/28/2021 showed EF 35 to 40%.  Underwent successful TEE/DCCV with conversion to sinus rhythm.  He had recurrence of A-fib with RVR during admission and was started on amiodarone.  Repeat cardioversion scheduled for 08/02/2021 was canceled, as he converted back to sinus rhythm on amiodarone.  Repeat echocardiogram 11/02/2021 showed EF 50 to 55%, moderate LVH, grade 1 diastolic dysfunction, normal RV function, mild MR, mild dilatation of aortic root measuring 40 mm. -Continue Eliquis 5 mg twice daily -Continue Toprol-XL 50 mg daily -Continue amiodarone 200 mg daily.  Check TSH, LFTs.  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  Refer to EP to evaluate for A-fib ablation.  Will refer to Dr. Quentin Ore, as his wife also  follows with him  Chronic combined heart failure: Echocardiogram 06/28/2021 showed EF 35 to 40%.  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  With restoration of sinus rhythm, repeat echocardiogram 11/02/2021 showed improvement in EF to 50 to 55%. -Continue Entresto 24-26 mg twice daily -Continue Toprol-XL 25 mg daily -Continue Jardiance -Appears euvolemic.  Hypokalemia on recent labs.  Will switch Lasix to as needed if gains more than 3 pounds in 1 day or 5 pounds in 1 week  DOE: Significant coronary calcifications noted on recent CT chest at [place].  He denies any chest pain but reports dyspnea with walking up stairs.  Recommend Lexiscan Myoview to evaluate for ischemia  Hypertension: On Entresto, Toprol-XL  Hyperlipidemia: On atorvastatin 40 mg daily.  LDL 70 on 12/2021  RTC in 3 months  Shared Decision Making/Informed Consent The risks [chest pain, shortness of breath, cardiac arrhythmias, dizziness, blood pressure fluctuations, myocardial infarction, stroke/transient ischemic attack, nausea, vomiting, allergic reaction, radiation exposure, metallic taste sensation and life-threatening complications (estimated to be 1 in 10,000)], benefits (risk stratification, diagnosing coronary artery disease, treatment guidance) and alternatives of a nuclear stress test were discussed in detail with Mr. Tippy and he agrees to proceed.    Medication Adjustments/Labs and Tests Ordered: Current medicines are reviewed at length with the patient today.  Concerns regarding medicines are outlined above.  Orders Placed This Encounter  Procedures   Comprehensive metabolic panel   TSH   Ambulatory referral to Cardiac Electrophysiology   MYOCARDIAL PERFUSION IMAGING   EKG 12-Lead   Meds ordered this encounter  Medications   furosemide (LASIX) 40 MG tablet    Sig: Take 1 tablet (40 mg total) by mouth daily as needed (for weight increase of 3 lbs overnight or 5 lbs in 1 week).    Dispense:  30 tablet     Refill:  3    Patient Instructions  Medication Instructions:  Change furosemide (Lasix) to AS NEEDED for weight gain of 3 lbs overnight or 5 lbs in 1 week.  *If you need a refill on your cardiac medications before your next appointment, please call your pharmacy*   Lab Work: CMET, TSH today  If you have labs (blood work) drawn today and your tests are  completely normal, you will receive your results only by: MyChart Message (if you have MyChart) OR A paper copy in the mail If you have any lab test that is abnormal or we need to change your treatment, we will call you to review the results.   Testing/Procedures: Your physician has requested that you have a Lexicographer (at [employer]). For further information please visit [email or web address]. Please follow instruction sheet, as given.   How to prepare for your Myocardial Perfusion Test: Do not eat or drink 3 hours prior to your test, except you may have water. Do not consume products containing caffeine (regular or decaffeinated) 12 hours prior to your test. (ex: coffee, chocolate, sodas, tea). Do bring a list of your current medications with you.  If not listed below, you may take your medications as normal. Do wear comfortable clothes (no dresses or overalls) and walking shoes, tennis shoes preferred (No heels or open toe shoes are allowed). Do NOT wear cologne, perfume, aftershave, or lotions (deodorant is allowed). The test will take approximately 3 to 4 hours to complete If these instructions are not followed, your test will have to be rescheduled.  Follow-Up: At [facility], you and your health needs are our priority.  As part of our continuing mission to provide you with exceptional heart care, we have created designated Provider Care Teams.  These Care Teams include your primary Cardiologist (physician) and Advanced Practice Providers (APPs -  Physician Assistants and Nurse Practitioners) who all work together to  provide you with the care you need, when you need it.  We recommend signing up for the patient portal called "MyChart".  Sign up information is provided on this After Visit Summary.  MyChart is used to connect with patients for Virtual Visits (Telemedicine).  Patients are able to view lab/test results, encounter notes, upcoming appointments, etc.  Non-urgent messages can be sent to your provider as well.   To learn more about what you can do with MyChart, go to [email or web address].    Your next appointment:   3 month(s)  The format for your next appointment:   In Person  Provider:   Donato Heinz, MD {   Other Instructions You have been referred to: Dr. Quentin Ore, electrophysiologist           Signed, Donato Heinz, MD  01/19/2022 6:00 PM    [place]

## 2022-01-19 ENCOUNTER — Encounter: Payer: Self-pay | Admitting: Cardiology

## 2022-01-19 ENCOUNTER — Telehealth (HOSPITAL_COMMUNITY): Payer: Self-pay | Admitting: *Deleted

## 2022-01-19 ENCOUNTER — Ambulatory Visit: Payer: Medicare Other | Admitting: Cardiology

## 2022-01-19 VITALS — BP 138/70 | HR 73 | Ht 68.0 in | Wt 231.8 lb

## 2022-01-19 DIAGNOSIS — I5042 Chronic combined systolic (congestive) and diastolic (congestive) heart failure: Secondary | ICD-10-CM

## 2022-01-19 DIAGNOSIS — I10 Essential (primary) hypertension: Secondary | ICD-10-CM

## 2022-01-19 DIAGNOSIS — R0602 Shortness of breath: Secondary | ICD-10-CM | POA: Diagnosis not present

## 2022-01-19 DIAGNOSIS — I4819 Other persistent atrial fibrillation: Secondary | ICD-10-CM

## 2022-01-19 DIAGNOSIS — E785 Hyperlipidemia, unspecified: Secondary | ICD-10-CM | POA: Diagnosis not present

## 2022-01-19 MED ORDER — FUROSEMIDE 40 MG PO TABS: 40.0000 mg | ORAL_TABLET | Freq: Every day | ORAL | 3 refills | Status: DC | PRN

## 2022-01-19 NOTE — Patient Instructions (Signed)
Medication Instructions:  Change furosemide (Lasix) to AS NEEDED for weight gain of 3 lbs overnight or 5 lbs in 1 week.  *If you need a refill on your cardiac medications before your next appointment, please call your pharmacy*   Lab Work: CMET, TSH today  If you have labs (blood work) drawn today and your tests are completely normal, you will receive your results only by: [place] (if you have MyChart) OR A paper copy in the mail If you have any lab test that is abnormal or we need to change your treatment, we will call you to review the results.   Testing/Procedures: Your physician has requested that you have a Lexicographer (at [employer]). For further information please visit [email or web address]. Please follow instruction sheet, as given.   How to prepare for your Myocardial Perfusion Test: Do not eat or drink 3 hours prior to your test, except you may have water. Do not consume products containing caffeine (regular or decaffeinated) 12 hours prior to your test. (ex: coffee, chocolate, sodas, tea). Do bring a list of your current medications with you.  If not listed below, you may take your medications as normal. Do wear comfortable clothes (no dresses or overalls) and walking shoes, tennis shoes preferred (No heels or open toe shoes are allowed). Do NOT wear cologne, perfume, aftershave, or lotions (deodorant is allowed). The test will take approximately 3 to 4 hours to complete If these instructions are not followed, your test will have to be rescheduled.  Follow-Up: At [facility], you and your health needs are our priority.  As part of our continuing mission to provide you with exceptional heart care, we have created designated Provider Care Teams.  These Care Teams include your primary Cardiologist (physician) and Advanced Practice Providers (APPs -  Physician Assistants and Nurse Practitioners) who all work together to provide you with the care you need, when you  need it.  We recommend signing up for the patient portal called "MyChart".  Sign up information is provided on this After Visit Summary.  MyChart is used to connect with patients for Virtual Visits (Telemedicine).  Patients are able to view lab/test results, encounter notes, upcoming appointments, etc.  Non-urgent messages can be sent to your provider as well.   To learn more about what you can do with MyChart, go to [email or web address].    Your next appointment:   3 month(s)  The format for your next appointment:   In Person  Provider:   Donato Heinz, MD {   Other Instructions You have been referred to: Dr. Quentin Ore, electrophysiologist

## 2022-01-19 NOTE — Telephone Encounter (Signed)
Patient given detailed instructions per Myocardial Perfusion Study Information Sheet for the test on 01/21/2022 at 7:45. Patient notified to arrive 15 minutes early and that it is imperative to arrive on time for appointment to keep from having the test rescheduled.  If you need to cancel or reschedule your appointment, please call the office within 24 hours of your appointment. . Patient verbalized understanding.Jeffrey Andrews

## 2022-01-20 LAB — COMPREHENSIVE METABOLIC PANEL
ALT: 10 IU/L (ref 0–44)
AST: 22 IU/L (ref 0–40)
Albumin/Globulin Ratio: 1.3 (ref 1.2–2.2)
Albumin: 4.5 g/dL (ref 3.8–4.8)
Alkaline Phosphatase: 60 IU/L (ref 44–121)
BUN/Creatinine Ratio: 14 (ref 10–24)
BUN: 13 mg/dL (ref 8–27)
Bilirubin Total: 0.6 mg/dL (ref 0.0–1.2)
CO2: 24 mmol/L (ref 20–29)
Calcium: 9.3 mg/dL (ref 8.6–10.2)
Chloride: 99 mmol/L (ref 96–106)
Creatinine, Ser: 0.91 mg/dL (ref 0.76–1.27)
Globulin, Total: 3.4 g/dL (ref 1.5–4.5)
Glucose: 130 mg/dL — ABNORMAL HIGH (ref 70–99)
Potassium: 4.3 mmol/L (ref 3.5–5.2)
Sodium: 138 mmol/L (ref 134–144)
Total Protein: 7.9 g/dL (ref 6.0–8.5)
eGFR: 87 mL/min/{1.73_m2} (ref >59)

## 2022-01-20 LAB — TSH: TSH: 2.1 u[IU]/mL (ref 0.450–4.500)

## 2022-01-21 ENCOUNTER — Ambulatory Visit (HOSPITAL_COMMUNITY): Payer: Medicare Other | Attending: Cardiology

## 2022-01-21 DIAGNOSIS — R0602 Shortness of breath: Secondary | ICD-10-CM | POA: Insufficient documentation

## 2022-01-21 LAB — MYOCARDIAL PERFUSION IMAGING
LV dias vol: 141 mL (ref 62–150)
LV sys vol: 69 mL
Nuc Stress EF: 51 %
Peak HR: 88 {beats}/min
Rest HR: 55 {beats}/min
Rest Nuclear Isotope Dose: 10.2 mCi
SDS: 0
SRS: 0
SSS: 0
Stress Nuclear Isotope Dose: 32.4 mCi
TID: 1.03

## 2022-01-21 MED ORDER — TECHNETIUM TC 99M TETROFOSMIN IV KIT
10.2000 | PACK | Freq: Once | INTRAVENOUS | Status: AC | PRN
  Administered 2022-01-21: 10.2 via INTRAVENOUS

## 2022-01-21 MED ORDER — TECHNETIUM TC 99M TETROFOSMIN IV KIT
32.4000 | PACK | Freq: Once | INTRAVENOUS | Status: AC | PRN
  Administered 2022-01-21: 32.4 via INTRAVENOUS

## 2022-01-21 MED ORDER — REGADENOSON 0.4 MG/5ML IV SOLN
0.4000 mg | Freq: Once | INTRAVENOUS | Status: AC
  Administered 2022-01-21: 0.4 mg via INTRAVENOUS

## 2022-03-22 ENCOUNTER — Encounter (INDEPENDENT_AMBULATORY_CARE_PROVIDER_SITE_OTHER): Payer: Medicare Other | Admitting: Ophthalmology

## 2022-03-25 ENCOUNTER — Other Ambulatory Visit: Payer: Self-pay | Admitting: Cardiology

## 2022-03-31 ENCOUNTER — Institutional Professional Consult (permissible substitution): Payer: Medicare Other | Admitting: Cardiology

## 2022-04-05 NOTE — Progress Notes (Unsigned)
Cardiology Office Note:    Date:  04/06/2022   ID:  Jeffrey Andrews, DOB [birth year], MRN [id number]  PCP:  Pcp, No  Cardiologist:  Donato Heinz, MD  Electrophysiologist:  None   Referring MD: No ref. provider found   Chief Complaint  Patient presents with   Congestive Heart Failure    History of Present Illness:    Jeffrey Andrews is a 76 y.o. male with a hx of persistent atrial fibrillation, OSA, hypertension who presents for follow-up.  He was admitted to [facility] 05/2021 with shortness of breath, found to be in atrial fibrillation with RVR.  Echocardiogram 06/28/2021 showed EF 35 to 40%.  Underwent successful TEE/DCCV with conversion to sinus rhythm.  He had recurrence of A-fib with RVR during admission and was started on amiodarone.  Repeat cardioversion scheduled for 08/02/2021 was canceled, as he converted back to sinus rhythm on amiodarone.  Repeat echocardiogram 11/02/2021 showed EF 50 to 55%, moderate LVH, grade 1 diastolic dysfunction, normal RV function, mild MR, mild dilatation of aortic root measuring 40 mm.  Labs on 01/06/2022 at [facility] showed LDL 70, A1c 6.6, hemoglobin 15.6, creatinine 0.9, potassium 3.3, sodium 135.  CT chest on 11/12/2021 showed no acute findings but noted to have extensive coronary artery calcifications.    Lexiscan Myoview on 01/21/2022 showed normal perfusion, EF 51%  Since last clinic visit, he reports that he is doing well.  Taking Eliquis, denies any bleeding issues.  Denies any chest pain, dyspnea, headedness, syncope, lower extremity edema, or palpitations.  Reports has not taken any Lasix.    Past Medical History:  Diagnosis Date   Arthritis    Cancer ([place]) 2014   skin cancer left arm   Excessive sweating    Glucose intolerance 06/27/2021   Heart murmur    "functional heart murmur"   History of kidney stones    History of skin cancer    Hypertension    Sleep apnea    uses c-pap    Past Surgical History:  Procedure Laterality Date   BACK  SURGERY     x3 last back surgery 1984   CARDIOVERSION N/A 07/01/2021   Procedure: CARDIOVERSION;  Surgeon: [name], MD;  Location: [place];  Service: Cardiovascular;  Laterality: N/A;   EYE SURGERY     cataracts with lens implants   STERIOD INJECTION Right 06/08/2015   Procedure: STEROID INJECTION;  Surgeon: Gaynelle Arabian, MD;  [facility]: [facility];  Service: Orthopedics;  Laterality: Right;   TEE WITHOUT CARDIOVERSION N/A 07/01/2021   Procedure: TRANSESOPHAGEAL ECHOCARDIOGRAM (TEE);  Surgeon: [name], MD;  Location: [place];  Service: Cardiovascular;  Laterality: N/A;   TENDON REPAIR Left 2000   knee   TONSILLECTOMY     TOTAL KNEE ARTHROPLASTY Left 06/08/2015   Procedure: TOTAL KNEE ARTHROPLASTY;  Surgeon: Gaynelle Arabian, MD;  [facility];  Service: Orthopedics;  Laterality: Left;   TOTAL KNEE ARTHROPLASTY Right 11/09/2015   Procedure: RIGHT TOTAL KNEE ARTHROPLASTY;  Surgeon: Gaynelle Arabian, MD;  [facility];  Service: Orthopedics;  Laterality: Right;   WRIST SURGERY  2008   left    Current Medications: Current Meds  Medication Sig   acetaminophen (TYLENOL) 500 MG tablet Take 500 mg by mouth every 8 (eight) hours as needed for moderate pain (For pain.).   amiodarone (PACERONE) 200 MG tablet Take 1 tablet (200 mg total) by mouth daily.   apixaban (ELIQUIS) 5 MG TABS tablet Take 1 tablet (5 mg total) by  mouth 2 (two) times daily.   atorvastatin (LIPITOR) 40 MG tablet Take 40 mg by mouth daily. Take 1 tablet by mouth daily.   empagliflozin (JARDIANCE) 25 MG TABS tablet Take 25 mg by mouth daily. Take 1/2 tablet by mouth daily.   loratadine (CLARITIN) 10 MG tablet Take 10 mg by mouth daily as needed for allergies or rhinitis.   metoprolol succinate (TOPROL-XL) 50 MG 24 hr tablet TAKE 1 TABLET BY MOUTH DAILY. TAKE WITH OR IMMEDIATELY FOLLOWING A MEAL.   Multiple Vitamin (MULTI-VITAMIN) tablet Take 1 tablet by mouth daily.   Omega-3 Fatty Acids (FISH OIL) 1000 MG  CAPS Take 1,000 mg by mouth 2 (two) times daily.   sacubitril-valsartan (ENTRESTO) 24-26 MG Take 1 tablet by mouth 2 (two) times daily.   spironolactone (ALDACTONE) 25 MG tablet Take 0.5 tablets (12.5 mg total) by mouth daily.     Allergies:   Patient has no known allergies.   Social History   Socioeconomic History   Marital status: Married    Spouse name: Not on file   Number of children: Not on file   Years of education: Not on file   Highest education level: Not on file  Occupational History   Not on file  Tobacco Use   Smoking status: Never   Smokeless tobacco: Former    Quit date: 10/29/1992  Substance and Sexual Activity   Alcohol use: No   Drug use: No   Sexual activity: Not on file  Other Topics Concern   Not on file  Social History Narrative   Not on file   Social Determinants of Health   Financial Resource Strain: Not on file  Food Insecurity: Not on file  Transportation Needs: Not on file  Physical Activity: Not on file  Stress: Not on file  Social Connections: Not on file     Family History: The patient's family history includes Brain cancer in his paternal uncle; Diabetes type II in his sister; Heart attack in his maternal uncle; Hypertension in his father and mother; Kidney disease in an other family member.  ROS:   Please see the history of present illness.     All other systems reviewed and are negative.  EKGs/Labs/Other Studies Reviewed:    The following studies were reviewed today:  EKG:   04/06/2022: Normal sinus rhythm, PVCs, rate 69  01/19/2022: Normal sinus rhythm, rate 73, no ST abnormality  Recent Labs: 06/27/2021: B Natriuretic Peptide 232.0 07/04/2021: Magnesium 2.0 07/20/2021: Platelets 235 08/02/2021: Hemoglobin 14.3 01/19/2022: ALT 10; BUN 13; Creatinine, Ser 0.91; Potassium 4.3; Sodium 138; TSH 2.100  Recent Lipid Panel No results found for: "CHOL", "TRIG", "HDL", "CHOLHDL", "VLDL", "LDLCALC", "LDLDIRECT"  Physical Exam:    VS:  BP  (!) 147/78   Pulse 69   Ht '5\' 8"'$  (1.727 m)   Wt 226 lb (102.5 kg)   SpO2 98%   BMI 34.36 kg/m     Wt Readings from Last 3 Encounters:  04/06/22 226 lb (102.5 kg)  01/21/22 231 lb (104.8 kg)  01/19/22 231 lb 12.8 oz (105.1 kg)     GEN:  Well nourished, well developed in no acute distress HEENT: Normal NECK: No JVD; No carotid bruits LYMPHATICS: No lymphadenopathy CARDIAC: RRR, no murmurs, rubs, gallops RESPIRATORY:  Clear to auscultation without rales, wheezing or rhonchi  ABDOMEN: Soft, non-tender, non-distended MUSCULOSKELETAL:  No edema; No deformity  SKIN: Warm and dry NEUROLOGIC:  Alert and oriented x 3 PSYCHIATRIC:  Normal affect   ASSESSMENT:  1. Persistent atrial fibrillation ([place])   2. Chronic combined systolic and diastolic heart failure ([place])   3. Shortness of breath   4. Essential hypertension   5. Hyperlipidemia, unspecified hyperlipidemia type     PLAN:    Persistent atrial fibrillation: admitted to [facility] 05/2021 with shortness of breath, found to be in atrial fibrillation with RVR.  Echocardiogram 06/28/2021 showed EF 35 to 40%.  Underwent successful TEE/DCCV with conversion to sinus rhythm.  He had recurrence of A-fib with RVR during admission and was started on amiodarone.  Repeat cardioversion scheduled for 08/02/2021 was canceled, as he converted back to sinus rhythm on amiodarone.  Repeat echocardiogram 11/02/2021 showed EF 50 to 55%, moderate LVH, grade 1 diastolic dysfunction, normal RV function, mild MR, mild dilatation of aortic root measuring 40 mm. -Continue Eliquis 5 mg twice daily -Continue Toprol-XL 50 mg daily -Continue amiodarone 200 mg daily.  Normal TSH, LFTs 12/2021.  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  Refer to EP to evaluate for A-fib ablation.  Will refer to Dr. Quentin Ore, as his wife also follows with him  Chronic combined heart failure: Echocardiogram 06/28/2021 showed EF 35 to 40%.  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  With restoration of sinus rhythm, repeat echocardiogram 11/02/2021 showed improvement in EF to 50 to 55%. -Continue Entresto 24-26 mg twice daily -Continue Toprol-XL 25 mg daily -Continue Jardiance -Add spironolactone 12.5 mg daily.  Check BMET -Appears euvolemic.  On Lasix as needed if gains more than 3 pounds in 1 day or 5 pounds in 1 week  DOE: Significant coronary calcifications noted on CT chest at [facility] 10/2021.  He denies any chest pain but reports dyspnea with walking up stairs.  Lexiscan Myoview on 01/21/2022 showed normal perfusion, EF 51%  Hypertension: On Entresto, Toprol-XL.  BP elevated, will add spironolactone as above.  Hyperlipidemia: On atorvastatin 40 mg daily.  LDL 70 on 12/2021  RTC in 4 months   Medication Adjustments/Labs and Tests Ordered: Current medicines are reviewed at length with the patient today.  Concerns regarding medicines are outlined above.  Orders Placed This Encounter  Procedures   Basic metabolic panel   EKG [id number]   Meds ordered this encounter  Medications   spironolactone (ALDACTONE) 25 MG tablet    Sig: Take 0.5 tablets (12.5 mg total) by mouth daily.    Dispense:  45 tablet    Refill:  3    Patient Instructions  Medication Instructions:  START spironolactone 12.5 mg daily  *If you need a refill on your cardiac medications before your next appointment, please call your pharmacy*   Lab Work: BMET today  If you have labs (blood work) drawn today and your tests are completely normal, you will receive your results only by: [place] (if you have MyChart) OR A paper copy in the mail If you have any lab test that is abnormal or we need to change your treatment, we will call you to review the results.  Follow-Up: At [facility], you and your health needs are our priority.  As part of our continuing mission to provide you with exceptional heart care, we have created designated Provider Care  Teams.  These Care Teams include your primary Cardiologist (physician) and Advanced Practice Providers (APPs -  Physician Assistants and Nurse Practitioners) who all work together to provide you with the care you need, when you need it.  We recommend signing up for the patient portal called "MyChart".  Sign up information  is provided on this After Visit Summary.  MyChart is used to connect with patients for Virtual Visits (Telemedicine).  Patients are able to view lab/test results, encounter notes, upcoming appointments, etc.  Non-urgent messages can be sent to your provider as well.   To learn more about what you can do with MyChart, go to [email or web address].    Your next appointment:   4 month(s)  The format for your next appointment:   In Person  Provider:   Donato Heinz, MD           Signed, Donato Heinz, MD  04/06/2022 12:57 PM    [place]

## 2022-04-06 ENCOUNTER — Encounter: Payer: Self-pay | Admitting: Cardiology

## 2022-04-06 ENCOUNTER — Ambulatory Visit: Payer: Medicare Other | Attending: Cardiology | Admitting: Cardiology

## 2022-04-06 VITALS — BP 147/78 | HR 69 | Ht 68.0 in | Wt 226.0 lb

## 2022-04-06 DIAGNOSIS — E785 Hyperlipidemia, unspecified: Secondary | ICD-10-CM

## 2022-04-06 DIAGNOSIS — I10 Essential (primary) hypertension: Secondary | ICD-10-CM | POA: Diagnosis not present

## 2022-04-06 DIAGNOSIS — R0602 Shortness of breath: Secondary | ICD-10-CM | POA: Diagnosis not present

## 2022-04-06 DIAGNOSIS — I5042 Chronic combined systolic (congestive) and diastolic (congestive) heart failure: Secondary | ICD-10-CM

## 2022-04-06 DIAGNOSIS — I4819 Other persistent atrial fibrillation: Secondary | ICD-10-CM

## 2022-04-06 LAB — BASIC METABOLIC PANEL
BUN/Creatinine Ratio: 15 (ref 10–24)
BUN: 13 mg/dL (ref 8–27)
CO2: 24 mmol/L (ref 20–29)
Calcium: 9.3 mg/dL (ref 8.6–10.2)
Chloride: 101 mmol/L (ref 96–106)
Creatinine, Ser: 0.84 mg/dL (ref 0.76–1.27)
Glucose: 119 mg/dL — ABNORMAL HIGH (ref 70–99)
Potassium: 4.4 mmol/L (ref 3.5–5.2)
Sodium: 138 mmol/L (ref 134–144)
eGFR: 90 mL/min/{1.73_m2} (ref >59)

## 2022-04-06 MED ORDER — SPIRONOLACTONE 25 MG PO TABS: 12.5000 mg | ORAL_TABLET | Freq: Every day | ORAL | 3 refills | Status: DC

## 2022-04-06 NOTE — Patient Instructions (Signed)
Medication Instructions:  START spironolactone 12.5 mg daily  *If you need a refill on your cardiac medications before your next appointment, please call your pharmacy*   Lab Work: BMET today  If you have labs (blood work) drawn today and your tests are completely normal, you will receive your results only by: [place] (if you have MyChart) OR A paper copy in the mail If you have any lab test that is abnormal or we need to change your treatment, we will call you to review the results.  Follow-Up: At [facility], you and your health needs are our priority.  As part of our continuing mission to provide you with exceptional heart care, we have created designated Provider Care Teams.  These Care Teams include your primary Cardiologist (physician) and Advanced Practice Providers (APPs -  Physician Assistants and Nurse Practitioners) who all work together to provide you with the care you need, when you need it.  We recommend signing up for the patient portal called "MyChart".  Sign up information is provided on this After Visit Summary.  MyChart is used to connect with patients for Virtual Visits (Telemedicine).  Patients are able to view lab/test results, encounter notes, upcoming appointments, etc.  Non-urgent messages can be sent to your provider as well.   To learn more about what you can do with MyChart, go to [email or web address].    Your next appointment:   4 month(s)  The format for your next appointment:   In Person  Provider:   Donato Heinz, MD

## 2022-04-07 ENCOUNTER — Other Ambulatory Visit: Payer: Self-pay | Admitting: *Deleted

## 2022-04-07 DIAGNOSIS — Z79899 Other long term (current) drug therapy: Secondary | ICD-10-CM

## 2022-04-07 DIAGNOSIS — I5042 Chronic combined systolic (congestive) and diastolic (congestive) heart failure: Secondary | ICD-10-CM

## 2022-04-14 DIAGNOSIS — Z79899 Other long term (current) drug therapy: Secondary | ICD-10-CM | POA: Diagnosis not present

## 2022-04-14 DIAGNOSIS — I5042 Chronic combined systolic (congestive) and diastolic (congestive) heart failure: Secondary | ICD-10-CM | POA: Diagnosis not present

## 2022-04-14 LAB — BASIC METABOLIC PANEL
BUN/Creatinine Ratio: 17 (ref 10–24)
BUN: 15 mg/dL (ref 8–27)
CO2: 25 mmol/L (ref 20–29)
Calcium: 9.3 mg/dL (ref 8.6–10.2)
Chloride: 100 mmol/L (ref 96–106)
Creatinine, Ser: 0.9 mg/dL (ref 0.76–1.27)
Glucose: 171 mg/dL — ABNORMAL HIGH (ref 70–99)
Potassium: 4.4 mmol/L (ref 3.5–5.2)
Sodium: 137 mmol/L (ref 134–144)
eGFR: 89 mL/min/{1.73_m2} (ref >59)

## 2022-05-08 NOTE — Progress Notes (Unsigned)
Electrophysiology Office Note:    Date:  05/08/2022   ID:  Jeffrey Andrews, DOB 1946-01-20, MRN [id number]  PCP:  Merryl Hacker, No  [facility] Cardiologist:  Donato Heinz, MD  [place] Electrophysiologist:  None   Referring MD: Donato Heinz*   Chief Complaint: Atrial fibrillation  History of Present Illness:    Jeffrey Andrews is a 76 y.o. male who presents for an evaluation of atrial fibrillation at the request of Dr. Gardiner Rhyme. Their medical history includes obstructive sleep apnea, hypertension, chronic systolic heart failure.  The patient last saw Dr. Gardiner Rhyme April 06, 2022.  The patient was initially diagnosed with atrial fibrillation in January when he was hospitalized with A-fib with RVR.  EF at that time was 35 to 40%.  Underwent cardioversion but had a recurrence of atrial fibrillation and was started on amiodarone.  After maintaining normal rhythm, his EF had improved to 50 to 55%.  He is referred to discuss catheter ablation as a mechanism to avoid long-term exposure to amiodarone.     Past Medical History:  Diagnosis Date   Arthritis    Cancer ([place]) 2014   skin cancer left arm   Excessive sweating    Glucose intolerance 06/27/2021   Heart murmur    "functional heart murmur"   History of kidney stones    History of skin cancer    Hypertension    Sleep apnea    uses c-pap    Past Surgical History:  Procedure Laterality Date   BACK SURGERY     x3 last back surgery 1984   CARDIOVERSION N/A 07/01/2021   Procedure: CARDIOVERSION;  Surgeon: [name], MD;  Location: [place];  Service: Cardiovascular;  Laterality: N/A;   EYE SURGERY     cataracts with lens implants   STERIOD INJECTION Right 06/08/2015   Procedure: STEROID INJECTION;  Surgeon: Gaynelle Arabian, MD;  [facility];  Service: Orthopedics;  Laterality: Right;   TEE WITHOUT CARDIOVERSION N/A 07/01/2021   Procedure: TRANSESOPHAGEAL ECHOCARDIOGRAM (TEE);  Surgeon: [name], MD;  Location: [place];  Service: Cardiovascular;  Laterality: N/A;   TENDON REPAIR Left 2000   knee   TONSILLECTOMY     TOTAL KNEE ARTHROPLASTY Left 06/08/2015   Procedure: TOTAL KNEE ARTHROPLASTY;  Surgeon: Gaynelle Arabian, MD;  [facility];  Service: Orthopedics;  Laterality: Left;   TOTAL KNEE ARTHROPLASTY Right 11/09/2015   Procedure: RIGHT TOTAL KNEE ARTHROPLASTY;  Surgeon: Gaynelle Arabian, MD;  [facility];  Service: Orthopedics;  Laterality: Right;   WRIST SURGERY  2008   left    Current Medications: No outpatient medications have been marked as taking for the 05/09/22 encounter (Appointment) with Vickie Epley, MD.     Allergies:   Patient has no known allergies.   Social History   Socioeconomic History   Marital status: Married    Spouse name: Not on file   Number of children: Not on file   Years of education: Not on file   Highest education level: Not on file  Occupational History   Not on file  Tobacco Use   Smoking status: Never   Smokeless tobacco: Former    Quit date: 10/29/1992  Substance and Sexual Activity   Alcohol use: No   Drug use: No   Sexual activity: Not on file  Other Topics Concern   Not on file  Social History Narrative   Not on file   Social Determinants of Health   Financial  Resource Strain: Not on file  Food Insecurity: Not on file  Transportation Needs: Not on file  Physical Activity: Not on file  Stress: Not on file  Social Connections: Not on file     Family History: The patient's family history includes Brain cancer in his paternal uncle; Diabetes type II in his sister; Heart attack in his maternal uncle; Hypertension in his father and mother; Kidney disease in an other family member.  ROS:   Please see the history of present illness.    All other systems reviewed and are negative.  EKGs/Labs/Other Studies Reviewed:    The following studies were reviewed today:  January 21, 2022 SPECT-normal  November 02, 2021 echo-EF 50, moderate LVH, normal RV, mild MR  April 06, 2022 EKG shows sinus rhythm, PAC, PVC    Recent Labs: 06/27/2021: B Natriuretic Peptide 232.0 07/04/2021: Magnesium 2.0 07/20/2021: Platelets 235 08/02/2021: Hemoglobin 14.3 01/19/2022: ALT 10; TSH 2.100 04/14/2022: BUN 15; Creatinine, Ser 0.90; Potassium 4.4; Sodium 137  Recent Lipid Panel No results found for: "CHOL", "TRIG", "HDL", "CHOLHDL", "VLDL", "LDLCALC", "LDLDIRECT"  Physical Exam:    VS:  There were no vitals taken for this visit.    Wt Readings from Last 3 Encounters:  04/06/22 226 lb (102.5 kg)  01/21/22 231 lb (104.8 kg)  01/19/22 231 lb 12.8 oz (105.1 kg)     GEN: *** Well nourished, well developed in no acute distress HEENT: Normal NECK: No JVD; No carotid bruits LYMPHATICS: No lymphadenopathy CARDIAC: ***RRR, no murmurs, rubs, gallops RESPIRATORY:  Clear to auscultation without rales, wheezing or rhonchi  ABDOMEN: Soft, non-tender, non-distended MUSCULOSKELETAL:  No edema; No deformity  SKIN: Warm and dry NEUROLOGIC:  Alert and oriented x 3 PSYCHIATRIC:  Normal affect       ASSESSMENT:    No diagnosis found. PLAN:    In order of problems listed above:  #Persistent atrial fibrillation Maintaining normal rhythm on amiodarone after cardioversion.  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  Discussed treatment options including catheter ablation and he wishes to proceed in an effort to avoid long-term exposure to amiodarone.  Discussed treatment options today for their AF including antiarrhythmic drug therapy and ablation. Discussed risks, recovery and likelihood of success. Discussed potential need for repeat ablation procedures and antiarrhythmic drugs after an initial ablation. They wish to proceed with scheduling.  Risk, benefits, and alternatives to EP study and radiofrequency ablation for afib were also discussed in detail today. These risks include but are  not limited to stroke, bleeding, vascular damage, tamponade, perforation, damage to the esophagus, lungs, and other structures, pulmonary vein stenosis, worsening renal function, and death. The patient understands these risk and wishes to proceed.  We will therefore proceed with catheter ablation at the next available time.  Carto, ICE, anesthesia are requested for the procedure.  Will also obtain CT PV protocol prior to the procedure to exclude LAA thrombus and further evaluate atrial anatomy.   #Chronic systolic heart failure Recovered EF with normalization of heart rhythm.  NYHA class II.  Warm and dry on exam.  Continue GDMT.  Rhythm control as above.  #Amiodarone monitoring TSH, AST and ALT within normal limits in August 2023.    Medication Adjustments/Labs and Tests Ordered: Current medicines are reviewed at length with the patient today.  Concerns regarding medicines are outlined above.  No orders of the defined types were placed in this encounter.  No orders of the defined types were placed in this encounter.  Signed, Hilton Cork. Quentin Ore, MD, [facility] 05/08/2022 4:45 PM    Electrophysiology [facility]

## 2022-05-09 ENCOUNTER — Encounter: Payer: Self-pay | Admitting: Cardiology

## 2022-05-09 ENCOUNTER — Ambulatory Visit: Payer: Medicare Other | Attending: Cardiology | Admitting: Cardiology

## 2022-05-09 VITALS — BP 138/66 | HR 63 | Ht 68.0 in | Wt 226.6 lb

## 2022-05-09 DIAGNOSIS — I10 Essential (primary) hypertension: Secondary | ICD-10-CM | POA: Diagnosis not present

## 2022-05-09 DIAGNOSIS — Z79899 Other long term (current) drug therapy: Secondary | ICD-10-CM | POA: Diagnosis not present

## 2022-05-09 DIAGNOSIS — I4819 Other persistent atrial fibrillation: Secondary | ICD-10-CM

## 2022-05-09 DIAGNOSIS — I5042 Chronic combined systolic (congestive) and diastolic (congestive) heart failure: Secondary | ICD-10-CM

## 2022-05-09 NOTE — Patient Instructions (Addendum)
Medication Instructions:  Your physician recommends that you continue on your current medications as directed. Please refer to the Current Medication list given to you today.  *If you need a refill on your cardiac medications before your next appointment, please call your pharmacy*  Lab Work: CMET, TSH, T4, CBC prior to CT scan If you have labs (blood work) drawn today and your tests are completely normal, you will receive your results only by: [employer] [employer] (if you have MyChart) OR A paper copy in the mail If you have any lab test that is abnormal or we need to change your treatment, we will call you to review the results.  Testing/Procedures: Your physician has requested that you have an echocardiogram. Echocardiography is a painless test that uses sound waves to create images of your heart. It provides your doctor with information about the size and shape of your heart and how well your heart's chambers and valves are working. This procedure takes approximately one hour. There are no restrictions for this procedure. Please do NOT wear cologne, perfume, aftershave, or lotions (deodorant is allowed). Please arrive 15 minutes prior to your appointment time.  Your physician has requested that you have cardiac CT. Cardiac computed tomography (CT) is a painless test that uses an x-ray machine to take clear, detailed pictures of your heart. For further information please visit [email or web address]. Please follow instruction sheet as given.  Your physician has recommended that you have an ablation. Catheter ablation is a medical procedure used to treat some cardiac arrhythmias (irregular heartbeats). During catheter ablation, a long, thin, flexible tube is put into a blood vessel in your groin (upper thigh), or neck. This tube is called an ablation catheter. It is then guided to your heart through the blood vessel. Radio frequency waves destroy small areas of heart tissue where abnormal heartbeats may  cause an arrhythmia to start. Please see the instruction sheet given to you today.  Follow-Up: At [facility], you and your health needs are our priority.  As part of our continuing mission to provide you with exceptional heart care, we have created designated Provider Care Teams.  These Care Teams include your primary Cardiologist (physician) and Advanced Practice Providers (APPs -  Physician Assistants and Nurse Practitioners) who all work together to provide you with the care you need, when you need it.  Your next appointment:   You will be contacted to arrange follow up  Important Information About [employer]

## 2022-05-09 NOTE — Progress Notes (Signed)
Electrophysiology Office Note:    Date:  05/09/2022   ID:  Jeffrey Andrews, DOB May 25, 1946, MRN [id number]  PCP:  Merryl Hacker, No  [facility] Cardiologist:  Donato Heinz, MD  [facility] Electrophysiologist:  Vickie Epley, MD   Referring MD: Donato Heinz*   Chief Complaint: Atrial fibrillation  History of Present Illness:    Jeffrey Andrews is a 76 y.o. male who presents for an evaluation of atrial fibrillation at the request of Dr. Gardiner Rhyme. Their medical history includes obstructive sleep apnea, hypertension, chronic systolic heart failure.  The patient last saw Dr. Gardiner Rhyme April 06, 2022.  The patient was initially diagnosed with atrial fibrillation in January when he was hospitalized with A-fib with RVR.  EF at that time was 35 to 40%.  Underwent cardioversion but had a recurrence of atrial fibrillation and was started on amiodarone.  After maintaining normal rhythm, his EF had improved to 50 to 55%.  He is referred to discuss catheter ablation as a mechanism to avoid long-term exposure to amiodarone.  Today, he states that he would like to pursue coming off amiodarone and anticoagulation. He endorses occasional epistaxis, but otherwise has no bleeding issues. However, he frequently bumps into objects and bruises easily. Also he admits to missing evening doses of Eliquis on the past 2 Thursdays.   Previously while in Afib he was short of breath and unable to walk very far. He also had central chest tightness.  He denies any palpitations, peripheral edema, lightheadedness, headaches, syncope, orthopnea, or PND.       Past Medical History:  Diagnosis Date   Arthritis    Cancer ([place]) 2014   skin cancer left arm   Excessive sweating    Glucose intolerance 06/27/2021   Heart murmur    "functional heart murmur"   History of kidney stones    History of skin cancer    Hypertension    Sleep apnea    uses c-pap    Past Surgical History:  Procedure  Laterality Date   BACK SURGERY     x3 last back surgery 1984   CARDIOVERSION N/A 07/01/2021   Procedure: CARDIOVERSION;  Surgeon: [name], MD;  Location: [place];  Service: Cardiovascular;  Laterality: N/A;   EYE SURGERY     cataracts with lens implants   STERIOD INJECTION Right 06/08/2015   Procedure: STEROID INJECTION;  Surgeon: Gaynelle Arabian, MD;  Location: [facility];  Service: Orthopedics;  Laterality: Right;   TEE WITHOUT CARDIOVERSION N/A 07/01/2021   Procedure: TRANSESOPHAGEAL ECHOCARDIOGRAM (TEE);  Surgeon: [name], MD;  Location: [place];  Service: Cardiovascular;  Laterality: N/A;   TENDON REPAIR Left 2000   knee   TONSILLECTOMY     TOTAL KNEE ARTHROPLASTY Left 06/08/2015   Procedure: TOTAL KNEE ARTHROPLASTY;  Surgeon: Gaynelle Arabian, MD;  Location: [facility];  Service: Orthopedics;  Laterality: Left;   TOTAL KNEE ARTHROPLASTY Right 11/09/2015   Procedure: RIGHT TOTAL KNEE ARTHROPLASTY;  Surgeon: Gaynelle Arabian, MD;  Location: [facility];  Service: Orthopedics;  Laterality: Right;   WRIST SURGERY  2008   left    Current Medications: Current Meds  Medication Sig   acetaminophen (TYLENOL) 500 MG tablet Take 650 mg by mouth every 8 (eight) hours as needed for moderate pain (For pain.).   amiodarone (PACERONE) 200 MG tablet Take 1 tablet (200 mg total) by mouth daily.   amLODipine (NORVASC) 5 MG tablet Take 1 tablet by mouth daily.   apixaban (ELIQUIS) 5  MG TABS tablet Take 1 tablet (5 mg total) by mouth 2 (two) times daily.   atorvastatin (LIPITOR) 40 MG tablet Take 40 mg by mouth daily. Take 1 tablet by mouth daily.   Cholecalciferol 25 MCG (1000 UT) TBDP Take 1 tablet by mouth daily.   empagliflozin (JARDIANCE) 25 MG TABS tablet Take 25 mg by mouth daily. Take 1/2 tablet by mouth daily.   furosemide (LASIX) 40 MG tablet Take 1 tablet (40 mg total) by mouth daily as needed (for weight increase of 3 lbs overnight or 5 lbs in 1 week).   ipratropium (ATROVENT) 0.03 %  nasal spray SPRAY 2 SPRAYS INTO EACH NOSTRIL TWICE A DAY FOR RUNNY NOSE   loratadine (CLARITIN) 10 MG tablet Take 10 mg by mouth daily as needed for allergies or rhinitis.   metoprolol succinate (TOPROL-XL) 50 MG 24 hr tablet TAKE 1 TABLET BY MOUTH DAILY. TAKE WITH OR IMMEDIATELY FOLLOWING A MEAL.   Multiple Vitamin (MULTI-VITAMIN) tablet Take 1 tablet by mouth daily.   Omega-3 Fatty Acids (FISH OIL) 1000 MG CAPS Take 1,000 mg by mouth 2 (two) times daily.   sacubitril-valsartan (ENTRESTO) 24-26 MG Take 1 tablet by mouth 2 (two) times daily.   spironolactone (ALDACTONE) 25 MG tablet Take 0.5 tablets (12.5 mg total) by mouth daily.     Allergies:   Patient has no known allergies.   Social History   Socioeconomic History   Marital status: Married    Spouse name: Not on file   Number of children: Not on file   Years of education: Not on file   Highest education level: Not on file  Occupational History   Not on file  Tobacco Use   Smoking status: Never   Smokeless tobacco: Former    Quit date: 10/29/1992  Substance and Sexual Activity   Alcohol use: No   Drug use: No   Sexual activity: Not on file  Other Topics Concern   Not on file  Social History Narrative   Not on file   Social Determinants of Health   Financial Resource Strain: Not on file  Food Insecurity: Not on file  Transportation Needs: Not on file  Physical Activity: Not on file  Stress: Not on file  Social Connections: Not on file     Family History: The patient's family history includes Brain cancer in his paternal uncle; Diabetes type II in his sister; Heart attack in his maternal uncle; Hypertension in his father and mother; Kidney disease in an other family member.  ROS:   Please see the history of present illness.    (+) Occasional epistaxis (+) Easy bruising All other systems reviewed and are negative.  EKGs/Labs/Other Studies Reviewed:    The following studies were reviewed today:  January 21, 2022  SPECT-normal  November 02, 2021 echo-EF 50, moderate LVH, normal RV, mild MR  April 06, 2022 EKG shows sinus rhythm, PAC, PVC    Recent Labs: 06/27/2021: B Natriuretic Peptide 232.0 07/04/2021: Magnesium 2.0 07/20/2021: Platelets 235 08/02/2021: Hemoglobin 14.3 01/19/2022: ALT 10; TSH 2.100 04/14/2022: BUN 15; Creatinine, Ser 0.90; Potassium 4.4; Sodium 137   Recent Lipid Panel No results found for: "CHOL", "TRIG", "HDL", "CHOLHDL", "VLDL", "LDLCALC", "LDLDIRECT"  Physical Exam:    VS:  BP 138/66   Pulse 63   Ht '5\' 8"'$  (1.727 m)   Wt 226 lb 9.6 oz (102.8 kg)   SpO2 98%   BMI 34.45 kg/m     Wt Readings from Last 3 Encounters:  05/09/22 226 lb 9.6 oz (102.8 kg)  04/06/22 226 lb (102.5 kg)  01/21/22 231 lb (104.8 kg)     GEN: Well nourished, well developed in no acute distress HEENT: Normal NECK: No JVD; No carotid bruits LYMPHATICS: No lymphadenopathy CARDIAC: RRR, no murmurs, rubs, gallops RESPIRATORY:  Clear to auscultation without rales, wheezing or rhonchi  ABDOMEN: Soft, non-tender, non-distended MUSCULOSKELETAL:  No edema; No deformity  SKIN: Warm and dry NEUROLOGIC:  Alert and oriented x 3 PSYCHIATRIC:  Normal affect       ASSESSMENT:    1. Persistent atrial fibrillation ([place])   2. Chronic combined systolic and diastolic heart failure ([place])   3. Primary hypertension   4. Encounter for long-term (current) use of high-risk medication    PLAN:    In order of problems listed above:  #Persistent atrial fibrillation Maintaining normal rhythm on amiodarone after cardioversion.  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  Discussed treatment options including catheter ablation and he wishes to proceed in an effort to avoid long-term exposure to amiodarone.  Discussed treatment options today for their AF including antiarrhythmic drug therapy and ablation. Discussed risks, recovery and likelihood of success. Discussed potential need for  repeat ablation procedures and antiarrhythmic drugs after an initial ablation. They wish to proceed with scheduling.  Risk, benefits, and alternatives to EP study and radiofrequency ablation for afib were also discussed in detail today. These risks include but are not limited to stroke, bleeding, vascular damage, tamponade, perforation, damage to the esophagus, lungs, and other structures, pulmonary vein stenosis, worsening renal function, and death. The patient understands these risk and wishes to proceed.  We will therefore proceed with catheter ablation at the next available time.  Carto, ICE, anesthesia are requested for the procedure.  Will also obtain CT PV protocol prior to the procedure to exclude LAA thrombus and further evaluate atrial anatomy.  The patient inquires about watchman implant during today's visit given his history of epistaxis and intermittently forgetting doses of his anticoagulant.  We discussed left atrial appendage occlusion in detail during today's visit and he wishes to proceed with evaluation.  ---------------------------  I have seen Jeffrey Andrews in the office today who is being considered for a Watchman left atrial appendage closure device. I believe they will benefit from this procedure given their history of atrial fibrillation, CHA2DS2-VASc score of 5 and unadjusted ischemic stroke rate of 7.2% per year. Unfortunately, the patient is not felt to be a long term anticoagulation candidate secondary to missed doses of anticoagulation and . The patient's chart has been reviewed and I feel that they would be a candidate for short term oral anticoagulation after Watchman implant.   It is my belief that after undergoing a LAA closure procedure, Jeffrey Andrews will not need long term anticoagulation which eliminates anticoagulation side effects and major bleeding risk.   Procedural risks for the Watchman implant have been reviewed with the patient including a 0.5% risk of  stroke, <1% risk of perforation and <1% risk of device embolization. Other risks include bleeding, vascular damage, tamponade, worsening renal function, and death. The patient understands these risk and wishes to proceed.     The published clinical data on the safety and effectiveness of WATCHMAN include but are not limited to the following: - Holmes DR, Mechele Claude, Sick P et al. for the PROTECT AF Investigators. Percutaneous closure of the left atrial appendage versus warfarin therapy for prevention of stroke in patients with atrial fibrillation:  a randomised non-inferiority trial. Lancet 2009; 374: 534-42. Mechele Claude, Doshi SK, Abelardo Diesel D et al. on behalf of the PROTECT AF Investigators. Percutaneous Left Atrial Appendage Closure for Stroke Prophylaxis in Patients With Atrial Fibrillation 2.3-Year Follow-up of the PROTECT AF (Watchman Left Atrial Appendage System for Embolic Protection in Patients With Atrial Fibrillation) Trial. Circulation 2013; 127:720-729. - Alli O, Doshi S,  Kar S, Reddy VY, Sievert H et al. Quality of Life Assessment in the Randomized PROTECT AF (Percutaneous Closure of the Left Atrial Appendage Versus Warfarin Therapy for Prevention of Stroke in Patients With Atrial Fibrillation) Trial of Patients at Risk for Stroke With Nonvalvular Atrial Fibrillation. J Am Coll Cardiol 2013; 19:6222-9. Vertell Limber DR, Tarri Abernethy, Price M, [place], Sievert H, Doshi S, Huber K, Reddy V. Prospective randomized evaluation of the Watchman left atrial appendage Device in patients with atrial fibrillation versus long-term warfarin therapy; the PREVAIL trial. Journal of the [employer] of Cardiology, Vol. 4, No. 1, 2014, 1-11. - Kar S, Doshi SK, Sadhu A, Horton R, Osorio J et al. Primary outcome evaluation of a next-generation left atrial appendage closure device: results from the PINNACLE FLX trial. Circulation 2021;143(18)1754-1762.    After today's visit with the patient which was dedicated  solely for shared decision making visit regarding LAA closure device, the patient decided to proceed with the LAA appendage closure procedure scheduled to be done in the near future at [facility].   HAS-BLED score 2 Hypertension Yes  Abnormal renal and liver function (Dialysis, transplant, Cr >2.26 mg/dL /Cirrhosis or Bilirubin >2x Normal or AST/ALT/AP >3x Normal) No  Stroke No  Bleeding No  Labile INR (Unstable/high INR) No  Elderly (>65) Yes  Drugs or alcohol (? 8 drinks/week, anti-plt or NSAID) No   CHA2DS2-VASc Score = 5  The patient's score is based upon: CHF History: 0 HTN History: 1 Diabetes History: 1 Stroke History: 0 Vascular Disease History: 1 Age Score: 2 Gender Score: 0    #Chronic systolic heart failure Recovered EF with normalization of heart rhythm.  NYHA class II.  Warm and dry on exam.  Continue GDMT.  Rhythm control as above.  #Amiodarone monitoring TSH, AST and ALT within normal limits in August 2023. Repeat CMP, TSH and free T4 today.      Medication Adjustments/Labs and Tests Ordered: Current medicines are reviewed at length with the patient today.  Concerns regarding medicines are outlined above.   No orders of the defined types were placed in this encounter.  No orders of the defined types were placed in this encounter.  I,Mathew Stumpf,acting as a Education administrator for Vickie Epley, MD.,have documented all relevant documentation on the behalf of Vickie Epley, MD,as directed by  Vickie Epley, MD while in the presence of Vickie Epley, MD.  I, Vickie Epley, MD, have reviewed all documentation for this visit. The documentation on 05/09/22 for the exam, diagnosis, procedures, and orders are all accurate and complete.   Signed, Hilton Cork. Quentin Ore, MD, [facility], [facility] 05/09/2022 10:20 AM    Electrophysiology [facility]

## 2022-05-10 ENCOUNTER — Telehealth (HOSPITAL_COMMUNITY): Payer: Self-pay | Admitting: Cardiology

## 2022-05-10 NOTE — Telephone Encounter (Signed)
I called to schedule the ordered echocardiogram and patient does not wish to have scheduled at this time. His wife is having valve replacement and he wants to wait til after December to see how she is going to do. He will call us back in January to schedule.  Order will be removed from the active echo Wq and when he calls back we will reinstate the order. Thank you

## 2022-07-07 ENCOUNTER — Encounter (HOSPITAL_COMMUNITY): Payer: Self-pay | Admitting: *Deleted

## 2022-07-09 ENCOUNTER — Encounter: Payer: Self-pay | Admitting: Cardiology

## 2022-07-11 MED ORDER — METOPROLOL SUCCINATE ER 50 MG PO TB24: 50.0000 mg | ORAL_TABLET | Freq: Every day | ORAL | 1 refills | Status: DC

## 2022-07-25 ENCOUNTER — Telehealth: Payer: Self-pay | Admitting: *Deleted

## 2022-07-25 DIAGNOSIS — I4891 Unspecified atrial fibrillation: Secondary | ICD-10-CM | POA: Diagnosis not present

## 2022-07-25 DIAGNOSIS — Z7901 Long term (current) use of anticoagulants: Secondary | ICD-10-CM | POA: Diagnosis not present

## 2022-07-25 NOTE — Telephone Encounter (Signed)
   [facility] Pre-operative Risk Assessment    Patient Name: Jeffrey Andrews  DOB: 12-27-1945 MRN: [id number]  HEARTCARE STAFF:  - IMPORTANT!!!!!! Under Visit Info/Reason for Call, type in Other and utilize the format Clearance MM/DD/YY or Clearance TBD. Do not use dashes or single digits. - Please review there is not already an duplicate clearance open for this procedure. - If request is for dental extraction, please clarify the # of teeth to be extracted. - If the patient is currently at the dentist's office, call Pre-Op Callback Staff (MA/nurse) to input urgent request.  - If the patient is not currently in the dentist office, please route to the Pre-Op pool.  Request for surgical clearance:  What type of surgery is being performed? Colonoscopy  When is this surgery scheduled? 09/05/22  What type of clearance is required (medical clearance vs. Pharmacy clearance to hold med vs. Both)? Both  Are there any medications that need to be held prior to surgery and how long? Eliquis 1-2 days prior  Practice name and name of physician performing surgery? [facility] Dr Michail Sermon  What is the office phone number? [phone]   7.   What is the office fax number? [phone]  8.   Anesthesia type (None, local, MAC, general) ? Propofol   Jackolyn Geron L 07/25/2022, 5:03 PM  _________________________________________________________________   (provider comments below)

## 2022-07-25 NOTE — Telephone Encounter (Signed)
Patient already has a routine follow-up visit with Dr. Gardiner Rhyme scheduled for 08/05/2022 and procedure is not scheduled until 09/05/2022. Therefore, pre-op risk assessment can be addressed at upcoming visit with Dr. Gardiner Rhyme. I will route this note to Dr. Gardiner Rhyme so that he is aware and will add "pre-op evaluation" to appointment notes.  Pharmacy, can you please go ahead and comment on how long Eliquis can be held for upcoming procedure?  Will go ahead and remove from pre-op pool.  Thank you! Jeffrey Andrews

## 2022-07-26 DIAGNOSIS — E119 Type 2 diabetes mellitus without complications: Secondary | ICD-10-CM | POA: Insufficient documentation

## 2022-07-26 NOTE — Telephone Encounter (Signed)
Patient with diagnosis of afib on Eliquis for anticoagulation.    Procedure: colonoscopy Date of procedure: 09/05/22  CHA2DS2-VASc Score = 6  This indicates a 9.7% annual risk of stroke. The patient's score is based upon: CHF History: 1 HTN History: 1 Diabetes History: 1 Stroke History: 0 Vascular Disease History: 1 Age Score: 2 Gender Score: 0  CrCl 32m/min using adjusted body weight Platelet count 235K  Per office protocol, patient can hold Eliquis for 1-2 days prior to procedure.    **This guidance is not considered finalized until pre-operative APP has relayed final recommendations.**

## 2022-07-26 NOTE — Telephone Encounter (Signed)
Has appt 3/8 for preop with Dr. Gardiner Rhyme, will route to him so he knows pharm recs are available for that visit. Separate input not needed from preop team, will remove from inbox.

## 2022-08-04 NOTE — Progress Notes (Signed)
Cardiology Office Note:    Date:  08/05/2022   ID:  Jeffrey Andrews, DOB 1946/04/17, MRN [id number]  PCP:  Pcp, No  Cardiologist:  Donato Heinz, MD  Electrophysiologist:  Vickie Epley, MD   Referring MD: No ref. provider found   Chief Complaint  Patient presents with   Follow-up    4 months.   Atrial Fibrillation    History of Present Illness:    Jeffrey Andrews is a 77 y.o. male with a hx of persistent atrial fibrillation, OSA, hypertension who presents for follow-up.  He was admitted to [facility] 05/2021 with shortness of breath, found to be in atrial fibrillation with RVR.  Echocardiogram 06/28/2021 showed EF 35 to 40%.  Underwent successful TEE/DCCV with conversion to sinus rhythm.  He had recurrence of A-fib with RVR during admission and was started on amiodarone.  Repeat cardioversion scheduled for 08/02/2021 was canceled, as he converted back to sinus rhythm on amiodarone.  Repeat echocardiogram 11/02/2021 showed EF 50 to 55%, moderate LVH, grade 1 diastolic dysfunction, normal RV function, mild MR, mild dilatation of aortic root measuring 40 mm.  Labs on 01/06/2022 at [facility] showed LDL 70, A1c 6.6, hemoglobin 15.6, creatinine 0.9, potassium 3.3, sodium 135.  CT chest on 11/12/2021 showed no acute findings but noted to have extensive coronary artery calcifications.    Lexiscan Myoview on 01/21/2022 showed normal perfusion, EF 51%  Since last clinic visit, he reports he is doing well.  Denies any chest pain, lightheadedness, syncope, lower extremity edema, or palpitations.reports some dyspnea with exertion.  Can walk up flight of stairs without stopping, denies any exertional symptoms with this.  Taking Eliquis, denies any bleeding issues.   Wt Readings from Last 3 Encounters:  08/05/22 226 lb (102.5 kg)  05/09/22 226 lb 9.6 oz (102.8 kg)  04/06/22 226 lb (102.5 kg)     Past Medical History:  Diagnosis Date   Arthritis    Cancer ([place]) 2014   skin cancer left arm   Excessive  sweating    Glucose intolerance 06/27/2021   Heart murmur    "functional heart murmur"   History of kidney stones    History of skin cancer    Hypertension    Sleep apnea    uses c-pap    Past Surgical History:  Procedure Laterality Date   BACK SURGERY     x3 last back surgery 1984   CARDIOVERSION N/A 07/01/2021   Procedure: CARDIOVERSION;  Surgeon: [name], MD;  Location: [place];  Service: Cardiovascular;  Laterality: N/A;   EYE SURGERY     cataracts with lens implants   STERIOD INJECTION Right 06/08/2015   Procedure: STEROID INJECTION;  Surgeon: Gaynelle Arabian, MD;  Location: [facility];  Service: Orthopedics;  Laterality: Right;   TEE WITHOUT CARDIOVERSION N/A 07/01/2021   Procedure: TRANSESOPHAGEAL ECHOCARDIOGRAM (TEE);  Surgeon: [name], MD;  Location: [place];  Service: Cardiovascular;  Laterality: N/A;   TENDON REPAIR Left 2000   knee   TONSILLECTOMY     TOTAL KNEE ARTHROPLASTY Left 06/08/2015   Procedure: TOTAL KNEE ARTHROPLASTY;  Surgeon: Gaynelle Arabian, MD;  Location[facility];  Service: Orthopedics;  Laterality: Left;   TOTAL KNEE ARTHROPLASTY Right 11/09/2015   Procedure: RIGHT TOTAL KNEE ARTHROPLASTY;  Surgeon: Gaynelle Arabian, MD;  [facility];  Service: Orthopedics;  Laterality: Right;   WRIST SURGERY  2008   left    Current Medications: Current Meds  Medication Sig   acetaminophen (TYLENOL) 500  MG tablet Take 650 mg by mouth every 8 (eight) hours as needed for moderate pain (For pain.).   amiodarone (PACERONE) 200 MG tablet Take 1 tablet (200 mg total) by mouth daily.   amLODipine (NORVASC) 5 MG tablet Take 1 tablet by mouth daily.   apixaban (ELIQUIS) 5 MG TABS tablet Take 1 tablet (5 mg total) by mouth 2 (two) times daily.   atorvastatin (LIPITOR) 40 MG tablet Take 40 mg by mouth daily. Take 1 tablet by mouth daily.   Cholecalciferol 25 MCG (1000 UT) TBDP Take 1 tablet by mouth daily.   empagliflozin (JARDIANCE) 25 MG TABS tablet Take 25 mg  by mouth daily. Take 1/2 tablet by mouth daily.   ipratropium (ATROVENT) 0.03 % nasal spray SPRAY 2 SPRAYS INTO EACH NOSTRIL TWICE A DAY FOR RUNNY NOSE   loratadine (CLARITIN) 10 MG tablet Take 10 mg by mouth daily as needed for allergies or rhinitis.   metoprolol succinate (TOPROL-XL) 50 MG 24 hr tablet Take 1 tablet (50 mg total) by mouth daily. TAKE WITH OR IMMEDIATELY FOLLOWING A MEAL.   Multiple Vitamin (MULTI-VITAMIN) tablet Take 1 tablet by mouth daily.   Omega-3 Fatty Acids (FISH OIL) 1000 MG CAPS Take 1,000 mg by mouth 2 (two) times daily.   sacubitril-valsartan (ENTRESTO) 24-26 MG Take 1 tablet by mouth 2 (two) times daily.   spironolactone (ALDACTONE) 25 MG tablet Take 0.5 tablets (12.5 mg total) by mouth daily.     Allergies:   Patient has no known allergies.   Social History   Socioeconomic History   Marital status: Married    Spouse name: Not on file   Number of children: Not on file   Years of education: Not on file   Highest education level: Not on file  Occupational History   Not on file  Tobacco Use   Smoking status: Never   Smokeless tobacco: Former    Quit date: 10/29/1992  Substance and Sexual Activity   Alcohol use: No   Drug use: No   Sexual activity: Not on file  Other Topics Concern   Not on file  Social History Narrative   Not on file   Social Determinants of Health   Financial Resource Strain: Not on file  Food Insecurity: Not on file  Transportation Needs: Not on file  Physical Activity: Not on file  Stress: Not on file  Social Connections: Not on file     Family History: The patient's family history includes Brain cancer in his paternal uncle; Diabetes type II in his sister; Heart attack in his maternal uncle; Hypertension in his father and mother; Kidney disease in an other family member.  ROS:   Please see the history of present illness.     All other systems reviewed and are negative.  EKGs/Labs/Other Studies Reviewed:    The  following studies were reviewed today:  EKG:   08/05/22:NST, PVC, rate 74, Qtc 481 04/06/2022: Normal sinus rhythm, PVCs, rate 69  01/19/2022: Normal sinus rhythm, rate 73, no ST abnormality  Recent Labs: 01/19/2022: ALT 10; TSH 2.100 04/14/2022: BUN 15; Creatinine, Ser 0.90; Potassium 4.4; Sodium 137  Recent Lipid Panel No results found for: "CHOL", "TRIG", "HDL", "CHOLHDL", "VLDL", "LDLCALC", "LDLDIRECT"  Physical Exam:    VS:  BP 124/64 (BP Location: Left Arm, Patient Position: Sitting, Cuff Size: Normal)   Pulse 74   Ht '5\' 6"'$  (1.676 m)   Wt 226 lb (102.5 kg)   BMI 36.48 kg/m  Wt Readings from Last 3 Encounters:  08/05/22 226 lb (102.5 kg)  05/09/22 226 lb 9.6 oz (102.8 kg)  04/06/22 226 lb (102.5 kg)     GEN:  Well nourished, well developed in no acute distress HEENT: Normal NECK: No JVD; No carotid bruits CARDIAC: RRR, no murmurs, rubs, gallops RESPIRATORY:  Clear to auscultation without rales, wheezing or rhonchi  ABDOMEN: Soft, non-tender, non-distended MUSCULOSKELETAL:  No edema; No deformity  SKIN: Warm and dry NEUROLOGIC:  Alert and oriented x 3 PSYCHIATRIC:  Normal affect   ASSESSMENT:    1. Pre-op evaluation   2. Persistent atrial fibrillation ([place])   3. Chronic combined systolic and diastolic heart failure ([place])   4. Essential hypertension   5. Hyperlipidemia, unspecified hyperlipidemia type      PLAN:    Preop evaluation: Prior to colonoscopy.  Good functional capacity, can achieve greater than 4 METS, denies any chest pain.  Reported dyspnea on exertion and underwent Lexiscan Myoview 12/2021 which showed no ischemia.  Echo 10/2021 showed normal biventricular function, no significant valvular disease -Low risk procedure, no further cardiac workup recommended prior to procedure -Can hold Eliquis x 48 hours prior to procedure and restart once okay per gastroenterologist  Persistent atrial fibrillation: admitted to [facility] 05/2021 with shortness of breath,  found to be in atrial fibrillation with RVR.  Echocardiogram 06/28/2021 showed EF 35 to 40%.  Underwent successful TEE/DCCV with conversion to sinus rhythm.  He had recurrence of A-fib with RVR during admission and was started on amiodarone.  Repeat cardioversion scheduled for 08/02/2021 was canceled, as he converted back to sinus rhythm on amiodarone.  Repeat echocardiogram 11/02/2021 showed EF 50 to 55%, moderate LVH, grade 1 diastolic dysfunction, normal RV function, mild MR, mild dilatation of aortic root measuring 40 mm. -Continue Eliquis 5 mg twice daily -Continue Toprol-XL 50 mg daily -Continue amiodarone 200 mg daily.  Normal TSH, LFTs 12/2021.  Will repeat CMET, TSH for monitoring.  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  Referred to EP to evaluate for A-fib ablation.  Seen by Dr. Quentin Ore, planning ablation and Watchman procedure  Chronic combined heart failure: Echocardiogram 06/28/2021 showed EF 35 to 40%.  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  With restoration of sinus rhythm, repeat echocardiogram 11/02/2021 showed improvement in EF to 50 to 55%. -Continue Entresto 24-26 mg twice daily -Continue Toprol-XL 25 mg daily -Continue Jardiance -Continue spironolactone 12.5 mg daily.  Check BMET -Appears euvolemic.  On Lasix as needed if gains more than 3 pounds in 1 day or 5 pounds in 1 week  DOE: Significant coronary calcifications noted on CT chest at [facility] 10/2021.  He denies any chest pain but reports dyspnea with exertion.  Lexiscan Myoview on 01/21/2022 showed normal perfusion, EF 51%  Hypertension: On Entresto, Toprol-XL, spironolactone.  Appears controlled  Hyperlipidemia: On atorvastatin 40 mg daily.  LDL 70 on 12/2021  RTC in 6 months  Medication Adjustments/Labs and Tests Ordered: Current medicines are reviewed at length with the patient today.  Concerns regarding medicines are outlined above.  Orders Placed This Encounter  Procedures    CBC   Comprehensive metabolic panel   TSH   EKG 12-Lead   No orders of the defined types were placed in this encounter.   Patient Instructions  Medication Instructions:  Your physician recommends that you continue on your current medications as directed. Please refer to the Current Medication list given to you today.  *If you need a refill on your cardiac medications  before your next appointment, please call your pharmacy*   Lab Work: CMET, CBC, TSH today  If you have labs (blood work) drawn today and your tests are completely normal, you will receive your results only by: [place] (if you have MyChart) OR A paper copy in the mail If you have any lab test that is abnormal or we need to change your treatment, we will call you to review the results.  Follow-Up: At [facility], you and your health needs are our priority.  As part of our continuing mission to provide you with exceptional heart care, we have created designated Provider Care Teams.  These Care Teams include your primary Cardiologist (physician) and Advanced Practice Providers (APPs -  Physician Assistants and Nurse Practitioners) who all work together to provide you with the care you need, when you need it.  We recommend signing up for the patient portal called "MyChart".  Sign up information is provided on this After Visit Summary.  MyChart is used to connect with patients for Virtual Visits (Telemedicine).  Patients are able to view lab/test results, encounter notes, upcoming appointments, etc.  Non-urgent messages can be sent to your provider as well.   To learn more about what you can do with MyChart, go to [email or web address].    Your next appointment:   6 month(s)  Provider:   Donato Heinz, MD     Other Instructions Ok to hold Eliquis for 2 days prior to colonoscopy    Signed, Donato Heinz, MD  08/05/2022 8:31 AM    [place]

## 2022-08-05 ENCOUNTER — Encounter: Payer: Self-pay | Admitting: Cardiology

## 2022-08-05 ENCOUNTER — Ambulatory Visit: Payer: Medicare Other | Attending: Cardiology | Admitting: Cardiology

## 2022-08-05 VITALS — BP 124/64 | HR 74 | Ht 66.0 in | Wt 226.0 lb

## 2022-08-05 DIAGNOSIS — Z01818 Encounter for other preprocedural examination: Secondary | ICD-10-CM

## 2022-08-05 DIAGNOSIS — I5042 Chronic combined systolic (congestive) and diastolic (congestive) heart failure: Secondary | ICD-10-CM | POA: Diagnosis not present

## 2022-08-05 DIAGNOSIS — E785 Hyperlipidemia, unspecified: Secondary | ICD-10-CM

## 2022-08-05 DIAGNOSIS — I10 Essential (primary) hypertension: Secondary | ICD-10-CM

## 2022-08-05 DIAGNOSIS — I4819 Other persistent atrial fibrillation: Secondary | ICD-10-CM | POA: Diagnosis not present

## 2022-08-05 NOTE — Patient Instructions (Signed)
Medication Instructions:  Your physician recommends that you continue on your current medications as directed. Please refer to the Current Medication list given to you today.  *If you need a refill on your cardiac medications before your next appointment, please call your pharmacy*   Lab Work: CMET, CBC, TSH today  If you have labs (blood work) drawn today and your tests are completely normal, you will receive your results only by: [place] (if you have MyChart) OR A paper copy in the mail If you have any lab test that is abnormal or we need to change your treatment, we will call you to review the results.  Follow-Up: At [facility], you and your health needs are our priority.  As part of our continuing mission to provide you with exceptional heart care, we have created designated Provider Care Teams.  These Care Teams include your primary Cardiologist (physician) and Advanced Practice Providers (APPs -  Physician Assistants and Nurse Practitioners) who all work together to provide you with the care you need, when you need it.  We recommend signing up for the patient portal called "MyChart".  Sign up information is provided on this After Visit Summary.  MyChart is used to connect with patients for Virtual Visits (Telemedicine).  Patients are able to view lab/test results, encounter notes, upcoming appointments, etc.  Non-urgent messages can be sent to your provider as well.   To learn more about what you can do with MyChart, go to [email or web address].    Your next appointment:   6 month(s)  Provider:   Donato Heinz, MD     Other Instructions Ok to hold Eliquis for 2 days prior to colonoscopy

## 2022-08-06 ENCOUNTER — Encounter: Payer: Self-pay | Admitting: Cardiology

## 2022-08-06 LAB — COMPREHENSIVE METABOLIC PANEL
ALT: 12 IU/L (ref 0–44)
AST: 20 IU/L (ref 0–40)
Albumin/Globulin Ratio: 2 (ref 1.2–2.2)
Albumin: 4.7 g/dL (ref 3.8–4.8)
Alkaline Phosphatase: 64 IU/L (ref 44–121)
BUN/Creatinine Ratio: 21 (ref 10–24)
BUN: 18 mg/dL (ref 8–27)
Bilirubin Total: 0.6 mg/dL (ref 0.0–1.2)
CO2: 23 mmol/L (ref 20–29)
Calcium: 9.4 mg/dL (ref 8.6–10.2)
Chloride: 98 mmol/L (ref 96–106)
Creatinine, Ser: 0.86 mg/dL (ref 0.76–1.27)
Globulin, Total: 2.3 g/dL (ref 1.5–4.5)
Glucose: 174 mg/dL — ABNORMAL HIGH (ref 70–99)
Potassium: 4.6 mmol/L (ref 3.5–5.2)
Sodium: 137 mmol/L (ref 134–144)
Total Protein: 7 g/dL (ref 6.0–8.5)
eGFR: 89 mL/min/{1.73_m2} (ref >59)

## 2022-08-06 LAB — CBC
Hematocrit: 48.4 % (ref 37.5–51.0)
Hemoglobin: 16.4 g/dL (ref 13.0–17.7)
MCH: 33.5 pg — ABNORMAL HIGH (ref 26.6–33.0)
MCHC: 33.9 g/dL (ref 31.5–35.7)
MCV: 99 fL — ABNORMAL HIGH (ref 79–97)
Platelets: 215 10*3/uL (ref 150–450)
RBC: 4.89 x10E6/uL (ref 4.14–5.80)
RDW: 12.1 % (ref 11.6–15.4)
WBC: 7.1 10*3/uL (ref 3.4–10.8)

## 2022-08-06 LAB — TSH: TSH: 1.26 u[IU]/mL (ref 0.450–4.500)

## 2022-08-18 ENCOUNTER — Encounter: Payer: Self-pay | Admitting: Cardiology

## 2022-09-05 DIAGNOSIS — I10 Essential (primary) hypertension: Secondary | ICD-10-CM | POA: Diagnosis not present

## 2022-09-05 DIAGNOSIS — Z8616 Personal history of COVID-19: Secondary | ICD-10-CM | POA: Diagnosis not present

## 2022-09-05 DIAGNOSIS — Z923 Personal history of irradiation: Secondary | ICD-10-CM | POA: Diagnosis not present

## 2022-09-05 DIAGNOSIS — E11649 Type 2 diabetes mellitus with hypoglycemia without coma: Secondary | ICD-10-CM | POA: Diagnosis not present

## 2022-09-05 DIAGNOSIS — Z7951 Long term (current) use of inhaled steroids: Secondary | ICD-10-CM | POA: Diagnosis not present

## 2022-09-05 DIAGNOSIS — K219 Gastro-esophageal reflux disease without esophagitis: Secondary | ICD-10-CM | POA: Diagnosis not present

## 2022-09-05 DIAGNOSIS — E1165 Type 2 diabetes mellitus with hyperglycemia: Secondary | ICD-10-CM | POA: Diagnosis not present

## 2022-09-05 DIAGNOSIS — Z794 Long term (current) use of insulin: Secondary | ICD-10-CM | POA: Diagnosis not present

## 2022-09-05 DIAGNOSIS — E785 Hyperlipidemia, unspecified: Secondary | ICD-10-CM | POA: Diagnosis not present

## 2022-09-05 DIAGNOSIS — Z09 Encounter for follow-up examination after completed treatment for conditions other than malignant neoplasm: Secondary | ICD-10-CM | POA: Diagnosis not present

## 2022-09-05 DIAGNOSIS — F17210 Nicotine dependence, cigarettes, uncomplicated: Secondary | ICD-10-CM | POA: Diagnosis not present

## 2022-09-05 DIAGNOSIS — K648 Other hemorrhoids: Secondary | ICD-10-CM | POA: Diagnosis not present

## 2022-09-05 DIAGNOSIS — L409 Psoriasis, unspecified: Secondary | ICD-10-CM | POA: Diagnosis not present

## 2022-09-05 DIAGNOSIS — Z86010 Personal history of colonic polyps: Secondary | ICD-10-CM | POA: Diagnosis not present

## 2022-09-05 DIAGNOSIS — Z79899 Other long term (current) drug therapy: Secondary | ICD-10-CM | POA: Diagnosis not present

## 2022-09-05 DIAGNOSIS — B342 Coronavirus infection, unspecified: Secondary | ICD-10-CM | POA: Diagnosis not present

## 2022-09-05 DIAGNOSIS — K621 Rectal polyp: Secondary | ICD-10-CM | POA: Diagnosis not present

## 2022-09-05 DIAGNOSIS — M109 Gout, unspecified: Secondary | ICD-10-CM | POA: Diagnosis not present

## 2022-09-05 DIAGNOSIS — Z9221 Personal history of antineoplastic chemotherapy: Secondary | ICD-10-CM | POA: Diagnosis not present

## 2022-09-05 DIAGNOSIS — Z7984 Long term (current) use of oral hypoglycemic drugs: Secondary | ICD-10-CM | POA: Diagnosis not present

## 2022-09-07 DIAGNOSIS — K621 Rectal polyp: Secondary | ICD-10-CM | POA: Diagnosis not present

## 2022-11-02 DIAGNOSIS — H353132 Nonexudative age-related macular degeneration, bilateral, intermediate dry stage: Secondary | ICD-10-CM | POA: Diagnosis not present

## 2022-11-02 DIAGNOSIS — H43811 Vitreous degeneration, right eye: Secondary | ICD-10-CM | POA: Diagnosis not present

## 2022-11-02 DIAGNOSIS — E119 Type 2 diabetes mellitus without complications: Secondary | ICD-10-CM | POA: Diagnosis not present

## 2022-11-02 DIAGNOSIS — Z9889 Other specified postprocedural states: Secondary | ICD-10-CM | POA: Diagnosis not present

## 2022-11-02 DIAGNOSIS — Z961 Presence of intraocular lens: Secondary | ICD-10-CM | POA: Diagnosis not present

## 2022-11-16 NOTE — Progress Notes (Signed)
Cardiology Office Note Date:  11/17/2022  Patient ID:  Jeffrey Andrews, Jeffrey Andrews August 29, 1945, MRN [id number] PCP:  Tenna Delaine, MD  Cardiologist:  Dr. Bjorn Pippin Electrophysiologist: Dr. Lalla Brothers     Chief Complaint:  pre-ablation  History of Present Illness: Jeffrey Andrews is a 77 y.o. male with history of OSA, HTN, chronic CHF (systolic) felt to be tachy-mediated, AFib  Referred to Dr. Lalla Brothers for rhythm management options, saw him 05/09/22, planned to pursue ablation in effort to avoid long-term amiodarone. Also discussed recurrent epistaxis, role of his OAC and pursuing watchman as well.  He saw Dr. Bjorn Pippin 08/05/22, discussed coronary calcification on CT, no anginal symptoms, good exertional capacity, improvement of his LVEF by myoview of 51%, with normal perfusion.  No reports of ongoing bleeding issues. Pending colonoscopy and felt an acceptable candidate. No changes made.  TODAY He is doing well Stays active with yard work, some work around [employer], a Merchant navy officer work  As long as he is in SR he feels well Afib makes him feel winded easily and "off" in a vague sense. No CP, palpitations or SOB No near syncope or syncope. He has switched from CPAP > BIPAP with full face mask and this has helped his nose bleeds quite a bit No bleeding or signs of bleeding otherwise   AFib/AAD hx Diagnosed Jan 2023 DCCV > ERAF > Amiodarone started Feb 2023   Past Medical History:  Diagnosis Date   Arthritis    Cancer ([facility]) 2014   skin cancer left arm   Excessive sweating    Glucose intolerance 06/27/2021   Heart murmur    "functional heart murmur"   History of kidney stones    History of skin cancer    Hypertension    Sleep apnea    uses c-pap    Past Surgical History:  Procedure Laterality Date   BACK SURGERY     x3 last back surgery 1984   CARDIOVERSION N/A 07/01/2021   Procedure: CARDIOVERSION;  Surgeon: Chilton Si, MD;  [facility]COPY;   Service: Cardiovascular;  Laterality: N/A;   EYE SURGERY     cataracts with lens implants   STERIOD INJECTION Right 06/08/2015   Procedure: STEROID INJECTION;  Surgeon: Ollen Gross, MD;  [facility];  Service: Orthopedics;  Laterality: Right;   TEE WITHOUT CARDIOVERSION N/A 07/01/2021   Procedure: TRANSESOPHAGEAL ECHOCARDIOGRAM (TEE);  Surgeon: Chilton Si, MD;  Location: [facility] ENDOSCOPY;  Service: Cardiovascular;  Laterality: N/A;   TENDON REPAIR Left 2000   knee   TONSILLECTOMY     TOTAL KNEE ARTHROPLASTY Left 06/08/2015   Procedure: TOTAL KNEE ARTHROPLASTY;  Surgeon: Ollen Gross, MD;  [facility];  Service: Orthopedics;  Laterality: Left;   TOTAL KNEE ARTHROPLASTY Right 11/09/2015   Procedure: RIGHT TOTAL KNEE ARTHROPLASTY;  Surgeon: Ollen Gross, MD;  [facility];  Service: Orthopedics;  Laterality: Right;   WRIST SURGERY  2008   left    Current Outpatient Medications  Medication Sig Dispense Refill   acetaminophen (TYLENOL) 500 MG tablet Take 650 mg by mouth every 8 (eight) hours as needed for moderate pain (For pain.).     amiodarone (PACERONE) 200 MG tablet Take 1 tablet (200 mg total) by mouth daily. 30 tablet 3   amLODipine (NORVASC) 5 MG tablet Take 1 tablet by mouth daily.     apixaban (ELIQUIS) 5 MG TABS tablet Take 1 tablet (5 mg total) by mouth 2 (two) times daily. 60  tablet 6   atorvastatin (LIPITOR) 40 MG tablet Take 40 mg by mouth daily. Take 1 tablet by mouth daily.     Cholecalciferol 25 MCG (1000 UT) TBDP Take 1 tablet by mouth daily.     empagliflozin (JARDIANCE) 25 MG TABS tablet Take 25 mg by mouth daily. Take 1/2 tablet by mouth daily.     ipratropium (ATROVENT) 0.03 % nasal spray SPRAY 2 SPRAYS INTO EACH NOSTRIL TWICE A DAY FOR RUNNY NOSE     loratadine (CLARITIN) 10 MG tablet Take 10 mg by mouth daily as needed for allergies or rhinitis.     metoprolol succinate (TOPROL-XL) 50 MG 24 hr tablet Take 1 tablet (50 mg total) by mouth daily. TAKE WITH  OR IMMEDIATELY FOLLOWING A MEAL. 90 tablet 1   Multiple Vitamin (MULTI-VITAMIN) tablet Take 1 tablet by mouth daily.     Omega-3 Fatty Acids (FISH OIL) 1000 MG CAPS Take 1,000 mg by mouth 2 (two) times daily.     sacubitril-valsartan (ENTRESTO) 24-26 MG Take 1 tablet by mouth 2 (two) times daily. 60 tablet 2   spironolactone (ALDACTONE) 25 MG tablet Take 0.5 tablets (12.5 mg total) by mouth daily. 45 tablet 3   No current facility-administered medications for this visit.    Allergies:   Patient has no known allergies.   Social History:  The patient  reports that he has never smoked. He quit smokeless tobacco use about 30 years ago. He reports that he does not drink alcohol and does not use drugs.   Family History:  The patient's family history includes Brain cancer in his paternal uncle; Diabetes type II in his sister; Heart attack in his maternal uncle; Hypertension in his father and mother; Kidney disease in an other family member.  ROS:  Please see the history of present illness.    All other systems are reviewed and otherwise negative.   PHYSICAL EXAM:  VS:  There were no vitals taken for this visit. BMI: There is no height or weight on file to calculate BMI. Well nourished, well developed, in no acute distress HEENT: normocephalic, atraumatic Neck: no JVD, carotid bruits or masses Cardiac:  RRR; no significant murmurs, no rubs, or gallops Lungs:  CTA b/l, no wheezing, rhonchi or rales Abd: soft, nontender MS: no deformity or atrophy Ext: no edema Skin: warm and dry, no rash Neuro:  No gross deficits appreciated Psych: euthymic mood, full affect   EKG:  Done today and reviewed by myself shows  SB 57bpm, stable from prior, no PVCs   01/21/22: stress myoview   Lexiscan stress EKG showed no changes in ST segments from baseline   Occasional PVCs occurred pre, during and post procedure.  Couplets present in postinfusion period.   Myoview scan shows normal perfusion.  No ischemia  or infarct.   LV is mildly dilated and LVEF is 51% with normal wall motion.    Consider alternate imaging to further define LVEF   Overall low risk study  11/02/2021: TTE 1. Left ventricular ejection fraction, by estimation, is 50 to 55%. The  left ventricle has low normal function. The left ventricle demonstrates  regional wall motion abnormalities (see scoring diagram/findings for  description). There is moderate  concentric left ventricular hypertrophy. Left ventricular diastolic  parameters are consistent with Grade I diastolic dysfunction (impaired  relaxation).   2. Right ventricular systolic function is normal.   3. The mitral valve is grossly normal. Mild mitral valve regurgitation.  No evidence of mitral stenosis.  The mean mitral valve gradient is 2.9 mmHg  with average heart rate of 67 bpm.   4. The aortic valve was not well visualized. Aortic valve regurgitation  is not visualized. No aortic stenosis is present.   5. Aortic dilatation noted. There is mild dilatation of the aortic root,  measuring 40 mm.   Comparison(s): LVEF has improved from prior (imaged in sinus bradycardia-  sinus rhythm).   06/28/21: TTE  1. Left ventricular ejection fraction, by estimation, is 35 to 40%. The  left ventricle has moderately decreased function. The left ventricle  demonstrates global hypokinesis. There is mild asymmetric left ventricular  hypertrophy of the posterior wall  segment. Left ventricular diastolic parameters are indeterminate.   2. Right ventricular systolic function is normal. The right ventricular  size is normal. Tricuspid regurgitation signal is inadequate for assessing  PA pressure.   3. Left atrial size was severely dilated.   4. The mitral valve is normal in structure. Moderate mitral valve  regurgitation. No evidence of mitral stenosis.   5. The aortic valve is normal in structure. Aortic valve regurgitation is  not visualized. No aortic stenosis is present.   6.  There is mild dilatation of the aortic root, measuring 37 mm. There is  mild dilatation of the ascending aorta, measuring 37 mm.   7. The inferior vena cava is dilated in size with <50% respiratory  variability, suggesting right atrial pressure of 15 mmHg.   Comparison(s): No prior Echocardiogram.    Recent Labs: 08/05/2022: ALT 12; BUN 18; Creatinine, Ser 0.86; Hemoglobin 16.4; Platelets 215; Potassium 4.6; Sodium 137; TSH 1.260  No results found for requested labs within last 365 days.   CrCl cannot be calculated (Patient's most recent lab result is older than the maximum 21 days allowed.).   Wt Readings from Last 3 Encounters:  08/05/22 226 lb (102.5 kg)  05/09/22 226 lb 9.6 oz (102.8 kg)  04/06/22 226 lb (102.5 kg)     Other studies reviewed: Additional studies/records reviewed today include: summarized above  ASSESSMENT AND PLAN:  Paroxysmal AFib CHA2DS2Vasc is 4, on Eliquis, appropriately dosed minimal burden by symptoms on amio Amiodarone Pending EPS/ablation scjheduled 12/09/22 watchman in the future, though nose bleeds have improved  Discussed his CT and his ablation days What to expect day of his procedure and post procedure restrictions, meds (protonix and colchicine) and rational for them, post ablation f/u  He had no f/u questions and remains agreeable Hopefully we can get him off amio after his ablation  No Jardiance 3d ahead of his ablation No extra metoprolol for his CT HR today 57 on his home Toprol   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  The patient did not have any concerns regarding medicines.  Norma Fredrickson, PA-C 11/17/2022 9:24 AM     [facility] [address] [place]  [address] [phone] (office)  [phone] (fax)

## 2022-11-17 ENCOUNTER — Ambulatory Visit: Payer: Medicare Other | Attending: Physician Assistant | Admitting: Physician Assistant

## 2022-11-17 ENCOUNTER — Encounter: Payer: Self-pay | Admitting: Physician Assistant

## 2022-11-17 ENCOUNTER — Ambulatory Visit: Payer: Medicare Other

## 2022-11-17 VITALS — BP 118/74 | HR 57 | Ht 66.0 in | Wt 222.2 lb

## 2022-11-17 DIAGNOSIS — I5022 Chronic systolic (congestive) heart failure: Secondary | ICD-10-CM

## 2022-11-17 DIAGNOSIS — I10 Essential (primary) hypertension: Secondary | ICD-10-CM

## 2022-11-17 DIAGNOSIS — E785 Hyperlipidemia, unspecified: Secondary | ICD-10-CM

## 2022-11-17 DIAGNOSIS — R0602 Shortness of breath: Secondary | ICD-10-CM

## 2022-11-17 DIAGNOSIS — I4819 Other persistent atrial fibrillation: Secondary | ICD-10-CM

## 2022-11-17 DIAGNOSIS — I5042 Chronic combined systolic (congestive) and diastolic (congestive) heart failure: Secondary | ICD-10-CM

## 2022-11-17 DIAGNOSIS — Z79899 Other long term (current) drug therapy: Secondary | ICD-10-CM

## 2022-11-17 DIAGNOSIS — Z01818 Encounter for other preprocedural examination: Secondary | ICD-10-CM

## 2022-11-17 NOTE — Patient Instructions (Addendum)
Medication Instructions:   Your physician recommends that you continue on your current medications as directed. Please refer to the Current Medication list given to you today.  *If you need a refill on your cardiac medications before your next appointment, please call your pharmacy*   Lab Work:   CMET   CBC AND TODAY    If you have labs (blood work) drawn today and your tests are completely normal, you will receive your results only by: MyChart Message (if you have MyChart) OR A paper copy in the mail If you have any lab test that is abnormal or we need to change your treatment, we will call you to review the results.   Testing/Procedures:  ON  12-02-22 Non-Cardiac CT scanning, (CAT scanning), is a noninvasive, special x-ray that produces cross-sectional images of the body using x-rays and a computer. CT scans help physicians diagnose and treat medical conditions. For some CT exams, a contrast material is used to enhance visibility in the area of the body being studied. CT scans provide greater clarity and reveal more details than regular x-ray exams.  ON  12-09-22 Your physician has recommended that you have an ablation. Catheter ablation is a medical procedure used to treat some cardiac arrhythmias (irregular heartbeats). During catheter ablation, a long, thin, flexible tube is put into a blood vessel in your groin (upper thigh), or neck. This tube is called an ablation catheter. It is then guided to your heart through the blood vessel. Radio frequency waves destroy small areas of heart tissue where abnormal heartbeats may cause an arrhythmia to start. Please see the instruction sheet given to you today.   Follow-Up: At [facility], you and your health needs are our priority.  As part of our continuing mission to provide you with exceptional heart care, we have created designated Provider Care Teams.  These Care Teams include your primary Cardiologist (physician) and Advanced Practice  Providers (APPs -  Physician Assistants and Nurse Practitioners) who all work together to provide you with the care you need, when you need it.  We recommend signing up for the patient portal called "MyChart".  Sign up information is provided on this After Visit Summary.  MyChart is used to connect with patients for Virtual Visits (Telemedicine).  Patients are able to view lab/test results, encounter notes, upcoming appointments, etc.  Non-urgent messages can be sent to your provider as well.   To learn more about what you can do with MyChart, go to [email or web address].    Your next appointment:  AFTER 12-09-22  POST FOLLOW UP AFTER CT/ ABLATION   3 week(s)  Provider:    You may see Lanier Prude, MD or one of the following Advanced Practice Providers on your designated Care Team:   Francis Dowse, [place] Casimiro Needle "Mardelle Matte" Lanna Poche, [place]   Other Instructions

## 2022-11-18 LAB — CBC
Hematocrit: 44.2 % (ref 37.5–51.0)
Hemoglobin: 15 g/dL (ref 13.0–17.7)
MCH: 33.4 pg — ABNORMAL HIGH (ref 26.6–33.0)
MCHC: 33.9 g/dL (ref 31.5–35.7)
MCV: 98 fL — ABNORMAL HIGH (ref 79–97)
Platelets: 204 10*3/uL (ref 150–450)
RBC: 4.49 x10E6/uL (ref 4.14–5.80)
RDW: 12.3 % (ref 11.6–15.4)
WBC: 7.8 10*3/uL (ref 3.4–10.8)

## 2022-11-18 LAB — TSH: TSH: 1.45 u[IU]/mL (ref 0.450–4.500)

## 2022-11-18 LAB — COMPREHENSIVE METABOLIC PANEL
ALT: 16 IU/L (ref 0–44)
AST: 61 IU/L — ABNORMAL HIGH (ref 0–40)
Albumin: 4.2 g/dL (ref 3.8–4.8)
Alkaline Phosphatase: 56 IU/L (ref 44–121)
BUN/Creatinine Ratio: 18 (ref 10–24)
BUN: 14 mg/dL (ref 8–27)
Bilirubin Total: 0.6 mg/dL (ref 0.0–1.2)
CO2: 25 mmol/L (ref 20–29)
Calcium: 9 mg/dL (ref 8.6–10.2)
Chloride: 103 mmol/L (ref 96–106)
Creatinine, Ser: 0.77 mg/dL (ref 0.76–1.27)
Globulin, Total: 2.5 g/dL (ref 1.5–4.5)
Glucose: 122 mg/dL — ABNORMAL HIGH (ref 70–99)
Potassium: 3.8 mmol/L (ref 3.5–5.2)
Sodium: 140 mmol/L (ref 134–144)
Total Protein: 6.7 g/dL (ref 6.0–8.5)
eGFR: 92 mL/min/{1.73_m2} (ref >59)

## 2022-11-27 NOTE — Pre-Procedure Instructions (Signed)
Patient is scheduled for procedure on Friday 7/12 that requires anesthesia.  Anesthesia requires certain medications for be held before procedure.  Left voicemail for the patient to hold Jardiance for 72 hours.  Last dose will be Monday 7/8, don't take any on Tuesday 7/9, Wednesday 7/10 or Thursday 7/11.  None on the day of procedure.

## 2022-11-29 ENCOUNTER — Encounter (HOSPITAL_COMMUNITY): Payer: Self-pay

## 2022-12-02 ENCOUNTER — Ambulatory Visit (HOSPITAL_COMMUNITY)
Admission: RE | Admit: 2022-12-02 | Discharge: 2022-12-02 | Disposition: A | Discharge status: Home / Self Care | Payer: Medicare Other | Source: Ambulatory Visit | Attending: Cardiology | Admitting: Cardiology

## 2022-12-02 DIAGNOSIS — I5042 Chronic combined systolic (congestive) and diastolic (congestive) heart failure: Secondary | ICD-10-CM

## 2022-12-02 DIAGNOSIS — I10 Essential (primary) hypertension: Secondary | ICD-10-CM

## 2022-12-02 DIAGNOSIS — I4819 Other persistent atrial fibrillation: Secondary | ICD-10-CM | POA: Diagnosis not present

## 2022-12-02 DIAGNOSIS — Z79899 Other long term (current) drug therapy: Secondary | ICD-10-CM

## 2022-12-02 MED ORDER — IOHEXOL 350 MG/ML SOLN
95.0000 mL | Freq: Once | INTRAVENOUS | Status: AC | PRN
  Administered 2022-12-02: 95 mL via INTRAVENOUS

## 2022-12-08 NOTE — Anesthesia Preprocedure Evaluation (Addendum)
Anesthesia Evaluation  Patient identified by MRN, date of birth, ID band Patient awake    Reviewed: Allergy & Precautions, NPO status , Patient's Chart, lab work & pertinent test results  Airway Mallampati: III  TM Distance: >3 FB Neck ROM: Full    Dental  (+) Missing   Pulmonary sleep apnea and Continuous Positive Airway Pressure Ventilation    Pulmonary exam normal        Cardiovascular hypertension, Pt. on medications and Pt. on home beta blockers Normal cardiovascular exam+ dysrhythmias Atrial Fibrillation + Valvular Problems/Murmurs (mild MR) MR   Myocardial Perfusion (12/2021): LV perfusion is normal. There is no evidence of ischemia. There is no evidence of infarction.   Neuro/Psych negative neurological ROS  negative psych ROS   GI/Hepatic negative GI ROS, Neg liver ROS,,,  Endo/Other  diabetes, Oral Hypoglycemic Agents    Renal/GU negative Renal ROS     Musculoskeletal  (+) Arthritis ,    Abdominal  (+) + obese  Peds  Hematology  (+) Blood dyscrasia (Eliquis)   Anesthesia Other Findings A-fib  Reproductive/Obstetrics                              Anesthesia Physical Anesthesia Plan  ASA: 3  Anesthesia Plan: General   Post-op Pain Management:    Induction: Intravenous  PONV Risk Score and Plan: 2 and Ondansetron, Dexamethasone and Treatment may vary due to age or medical condition  Airway Management Planned: Oral ETT  Additional Equipment:   Intra-op Plan:   Post-operative Plan: Extubation in OR  Informed Consent: I have reviewed the patients History and Physical, chart, labs and discussed the procedure including the risks, benefits and alternatives for the proposed anesthesia with the patient or authorized representative who has indicated his/her understanding and acceptance.     Dental advisory given  Plan Discussed with: CRNA  Anesthesia Plan Comments:           Anesthesia Quick Evaluation

## 2022-12-08 NOTE — Pre-Procedure Instructions (Signed)
Attempted to call patient regarding procedure instructions for tomorrow.  Left voicemail on the following items: Arrival time 0515 Nothing to eat or drink after midnight No meds AM of procedure Responsible person to drive you home and stay with you for 24 hrs  Have you missed any doses of anti-coagulant Eliquis- should be taking twice a day, if you have missed any doses please let office know.

## 2022-12-09 ENCOUNTER — Encounter (HOSPITAL_COMMUNITY)
Admission: RE | Disposition: A | Discharge status: Home / Self Care | Payer: Self-pay | Source: Home / Self Care | Attending: Cardiology

## 2022-12-09 ENCOUNTER — Ambulatory Visit (HOSPITAL_COMMUNITY): Payer: Medicare Other | Admitting: Anesthesiology

## 2022-12-09 ENCOUNTER — Ambulatory Visit (HOSPITAL_COMMUNITY)
Admission: RE | Admit: 2022-12-09 | Discharge: 2022-12-09 | Disposition: A | Discharge status: Home / Self Care | Payer: Medicare Other | Attending: Cardiology | Admitting: Cardiology

## 2022-12-09 ENCOUNTER — Ambulatory Visit (HOSPITAL_BASED_OUTPATIENT_CLINIC_OR_DEPARTMENT_OTHER): Payer: Medicare Other | Admitting: Anesthesiology

## 2022-12-09 ENCOUNTER — Other Ambulatory Visit (HOSPITAL_COMMUNITY): Payer: Self-pay

## 2022-12-09 ENCOUNTER — Other Ambulatory Visit: Payer: Self-pay

## 2022-12-09 DIAGNOSIS — Z8249 Family history of ischemic heart disease and other diseases of the circulatory system: Secondary | ICD-10-CM | POA: Diagnosis not present

## 2022-12-09 DIAGNOSIS — I110 Hypertensive heart disease with heart failure: Secondary | ICD-10-CM | POA: Insufficient documentation

## 2022-12-09 DIAGNOSIS — G4730 Sleep apnea, unspecified: Secondary | ICD-10-CM | POA: Diagnosis not present

## 2022-12-09 DIAGNOSIS — Z87891 Personal history of nicotine dependence: Secondary | ICD-10-CM | POA: Insufficient documentation

## 2022-12-09 DIAGNOSIS — I4891 Unspecified atrial fibrillation: Secondary | ICD-10-CM

## 2022-12-09 DIAGNOSIS — I5042 Chronic combined systolic (congestive) and diastolic (congestive) heart failure: Secondary | ICD-10-CM | POA: Insufficient documentation

## 2022-12-09 DIAGNOSIS — Z79899 Other long term (current) drug therapy: Secondary | ICD-10-CM | POA: Diagnosis not present

## 2022-12-09 DIAGNOSIS — E785 Hyperlipidemia, unspecified: Secondary | ICD-10-CM | POA: Diagnosis not present

## 2022-12-09 DIAGNOSIS — I5041 Acute combined systolic (congestive) and diastolic (congestive) heart failure: Secondary | ICD-10-CM | POA: Diagnosis not present

## 2022-12-09 DIAGNOSIS — E119 Type 2 diabetes mellitus without complications: Secondary | ICD-10-CM

## 2022-12-09 DIAGNOSIS — Z7901 Long term (current) use of anticoagulants: Secondary | ICD-10-CM | POA: Diagnosis not present

## 2022-12-09 HISTORY — PX: ATRIAL FIBRILLATION ABLATION: EP1191

## 2022-12-09 LAB — POCT ACTIVATED CLOTTING TIME
Activated Clotting Time: 311 seconds
Activated Clotting Time: 330 seconds

## 2022-12-09 LAB — GLUCOSE, CAPILLARY
Glucose-Capillary: 120 mg/dL — ABNORMAL HIGH (ref 70–99)
Glucose-Capillary: 137 mg/dL — ABNORMAL HIGH (ref 70–99)

## 2022-12-09 SURGERY — ATRIAL FIBRILLATION ABLATION
Anesthesia: General

## 2022-12-09 MED ORDER — LIDOCAINE 2% (20 MG/ML) 5 ML SYRINGE
INTRAMUSCULAR | Status: DC | PRN
  Administered 2022-12-09: 60 mg via INTRAVENOUS

## 2022-12-09 MED ORDER — APIXABAN 5 MG PO TABS
5.0000 mg | ORAL_TABLET | Freq: Two times a day (BID) | ORAL | Status: DC
  Administered 2022-12-09: 5 mg via ORAL
  Filled 2022-12-09: qty 1

## 2022-12-09 MED ORDER — COLCHICINE 0.6 MG PO TABS
0.6000 mg | ORAL_TABLET | Freq: Two times a day (BID) | ORAL | 0 refills | Status: DC
  Filled 2022-12-09: qty 10, 5d supply, fill #0

## 2022-12-09 MED ORDER — ACETAMINOPHEN 500 MG PO TABS
1000.0000 mg | ORAL_TABLET | Freq: Once | ORAL | Status: AC
  Administered 2022-12-09: 1000 mg via ORAL
  Filled 2022-12-09: qty 2

## 2022-12-09 MED ORDER — ACETAMINOPHEN 325 MG PO TABS: 650.0000 mg | ORAL_TABLET | ORAL | Status: DC | PRN

## 2022-12-09 MED ORDER — HEPARIN SODIUM (PORCINE) 1000 UNIT/ML IJ SOLN
INTRAMUSCULAR | Status: DC | PRN
  Administered 2022-12-09: 15000 [IU] via INTRAVENOUS
  Administered 2022-12-09: 4000 [IU] via INTRAVENOUS
  Administered 2022-12-09: 3000 [IU] via INTRAVENOUS

## 2022-12-09 MED ORDER — PROPOFOL 10 MG/ML IV BOLUS
INTRAVENOUS | Status: DC | PRN
  Administered 2022-12-09: 100 mg via INTRAVENOUS

## 2022-12-09 MED ORDER — SODIUM CHLORIDE 0.9 % IV SOLN: INTRAVENOUS | Status: DC

## 2022-12-09 MED ORDER — MIDAZOLAM HCL 2 MG/2ML IJ SOLN
INTRAMUSCULAR | Status: DC | PRN
  Administered 2022-12-09 (×2): 1 mg via INTRAVENOUS

## 2022-12-09 MED ORDER — HEPARIN (PORCINE) IN NACL 1000-0.9 UT/500ML-% IV SOLN
INTRAVENOUS | Status: DC | PRN
  Administered 2022-12-09 (×3): 500 mL

## 2022-12-09 MED ORDER — SODIUM CHLORIDE 0.9 % IV SOLN: 250.0000 mL | INTRAVENOUS | Status: DC | PRN

## 2022-12-09 MED ORDER — HEPARIN SODIUM (PORCINE) 1000 UNIT/ML IJ SOLN
INTRAMUSCULAR | Status: DC | PRN
  Administered 2022-12-09: 1000 [IU] via INTRAVENOUS

## 2022-12-09 MED ORDER — SUGAMMADEX SODIUM 200 MG/2ML IV SOLN
INTRAVENOUS | Status: DC | PRN
  Administered 2022-12-09: 200 mg via INTRAVENOUS

## 2022-12-09 MED ORDER — PANTOPRAZOLE SODIUM 40 MG PO TBEC
40.0000 mg | DELAYED_RELEASE_TABLET | Freq: Every day | ORAL | Status: DC
  Administered 2022-12-09: 40 mg via ORAL
  Filled 2022-12-09: qty 1

## 2022-12-09 MED ORDER — SODIUM CHLORIDE 0.9% FLUSH: 3.0000 mL | Freq: Two times a day (BID) | INTRAVENOUS | Status: DC

## 2022-12-09 MED ORDER — SODIUM CHLORIDE 0.9% FLUSH: 3.0000 mL | INTRAVENOUS | Status: DC | PRN

## 2022-12-09 MED ORDER — ROCURONIUM BROMIDE 10 MG/ML (PF) SYRINGE
PREFILLED_SYRINGE | INTRAVENOUS | Status: DC | PRN
  Administered 2022-12-09: 60 mg via INTRAVENOUS

## 2022-12-09 MED ORDER — PHENYLEPHRINE 80 MCG/ML (10ML) SYRINGE FOR IV PUSH (FOR BLOOD PRESSURE SUPPORT)
PREFILLED_SYRINGE | INTRAVENOUS | Status: DC | PRN
  Administered 2022-12-09: 80 ug via INTRAVENOUS

## 2022-12-09 MED ORDER — EPHEDRINE SULFATE-NACL 50-0.9 MG/10ML-% IV SOSY
PREFILLED_SYRINGE | INTRAVENOUS | Status: DC | PRN
  Administered 2022-12-09: 10 mg via INTRAVENOUS

## 2022-12-09 MED ORDER — ONDANSETRON HCL 4 MG/2ML IJ SOLN
INTRAMUSCULAR | Status: DC | PRN
  Administered 2022-12-09: 4 mg via INTRAVENOUS

## 2022-12-09 MED ORDER — PANTOPRAZOLE SODIUM 40 MG PO TBEC
40.0000 mg | DELAYED_RELEASE_TABLET | Freq: Every day | ORAL | 0 refills | Status: DC
  Filled 2022-12-09: qty 45, 45d supply, fill #0

## 2022-12-09 MED ORDER — PHENYLEPHRINE HCL-NACL 20-0.9 MG/250ML-% IV SOLN
INTRAVENOUS | Status: DC | PRN
  Administered 2022-12-09: 25 ug/min via INTRAVENOUS

## 2022-12-09 MED ORDER — FENTANYL CITRATE (PF) 100 MCG/2ML IJ SOLN
INTRAMUSCULAR | Status: DC | PRN
  Administered 2022-12-09: 100 ug via INTRAVENOUS

## 2022-12-09 MED ORDER — DEXAMETHASONE SODIUM PHOSPHATE 10 MG/ML IJ SOLN
INTRAMUSCULAR | Status: DC | PRN
  Administered 2022-12-09: 5 mg via INTRAVENOUS

## 2022-12-09 MED ORDER — PROTAMINE SULFATE 10 MG/ML IV SOLN
INTRAVENOUS | Status: DC | PRN
  Administered 2022-12-09: 35 mg via INTRAVENOUS

## 2022-12-09 MED ORDER — COLCHICINE 0.6 MG PO TABS
0.6000 mg | ORAL_TABLET | Freq: Two times a day (BID) | ORAL | Status: DC
  Administered 2022-12-09: 0.6 mg via ORAL
  Filled 2022-12-09: qty 1

## 2022-12-09 SURGICAL SUPPLY — 19 items
BAG SNAP BAND KOVER 36X36 (MISCELLANEOUS) IMPLANT
CATH ABLAT QDOT MICRO BI TC DF (CATHETERS) IMPLANT
CATH OCTARAY 2.0 F 3-3-3-3-3 (CATHETERS) IMPLANT
CATH S-M CIRCA TEMP PROBE (CATHETERS) IMPLANT
CATH SOUNDSTAR ECO 8FR (CATHETERS) IMPLANT
CATH WEBSTER BI DIR CS D-F CRV (CATHETERS) IMPLANT
CLOSURE PERCLOSE PROSTYLE (VASCULAR PRODUCTS) IMPLANT
COVER SWIFTLINK CONNECTOR (BAG) ×1 IMPLANT
MAT PREVALON FULL STRYKER (MISCELLANEOUS) IMPLANT
PACK EP LATEX FREE (CUSTOM PROCEDURE TRAY) ×1
PACK EP LF (CUSTOM PROCEDURE TRAY) ×1 IMPLANT
PAD DEFIB RADIO PHYSIO CONN (PAD) ×1 IMPLANT
PATCH CARTO3 (PAD) IMPLANT
SHEATH BAYLIS TRANSSEPTAL 98CM (NEEDLE) IMPLANT
SHEATH CARTO VIZIGO SM CVD (SHEATH) IMPLANT
SHEATH PINNACLE 8F 10CM (SHEATH) IMPLANT
SHEATH PINNACLE 9F 10CM (SHEATH) IMPLANT
SHEATH PROBE COVER 6X72 (BAG) IMPLANT
TUBING SMART ABLATE COOLFLOW (TUBING) IMPLANT

## 2022-12-09 NOTE — H&P (Signed)
Electrophysiology Office Note:     Date:  12/09/2022    ID:  Jeffrey Andrews, DOB 16-Aug-1945, MRN [id number]   PCP:  Oneita Hurt, No          [facility] Cardiologist:  Little Ishikawa, MD  [facility] Electrophysiologist:  Lanier Prude, MD    Referring MD: Little Ishikawa*    Chief Complaint: Atrial fibrillation   History of Present Illness:     Jeffrey Andrews is a 77 y.o. male who presents for an evaluation of atrial fibrillation at the request of Dr. Bjorn Pippin. Their medical history includes obstructive sleep apnea, hypertension, chronic systolic heart failure.  The patient last saw Dr. Bjorn Pippin April 06, 2022.  The patient was initially diagnosed with atrial fibrillation in January when he was hospitalized with A-fib with RVR.  EF at that time was 35 to 40%.  Underwent cardioversion but had a recurrence of atrial fibrillation and was started on amiodarone.  After maintaining normal rhythm, his EF had improved to 50 to 55%.  He is referred to discuss catheter ablation as a mechanism to avoid long-term exposure to amiodarone.   Today, he states that he would like to pursue coming off amiodarone and anticoagulation. He endorses occasional epistaxis, but otherwise has no bleeding issues. However, he frequently bumps into objects and bruises easily. Also he admits to missing evening doses of Eliquis on the past 2 Thursdays.    Previously while in Afib he was short of breath and unable to walk very far. He also had central chest tightness.   He denies any palpitations, peripheral edema, lightheadedness, headaches, syncope, orthopnea, or PND.    Presents for PVI today. Procedure reviewed.        Objective     Past Medical History:  Diagnosis Date   Arthritis     Cancer (HCC) 2014    skin cancer left arm   Excessive sweating     Glucose intolerance 06/27/2021   Heart murmur      "functional heart murmur"   History of kidney stones     History of skin cancer      Hypertension     Sleep apnea      uses c-pap               Past Surgical History:  Procedure Laterality Date   BACK SURGERY        x3 last back surgery 1984   CARDIOVERSION N/A 07/01/2021    Procedure: CARDIOVERSION;  Surgeon: Chilton Si, MD;  Location: [facility] ENDOSCOPY;  Service: Cardiovascular;  Laterality: N/A;   EYE SURGERY        cataracts with lens implants   STERIOD INJECTION Right 06/08/2015    Procedure: STEROID INJECTION;  Surgeon: Ollen Gross, MD;  Location: [facility] ORS;  Service: Orthopedics;  Laterality: Right;   TEE WITHOUT CARDIOVERSION N/A 07/01/2021    Procedure: TRANSESOPHAGEAL ECHOCARDIOGRAM (TEE);  Surgeon: Chilton Si, MD;  Location: [facility] ENDOSCOPY;  Service: Cardiovascular;  Laterality: N/A;   TENDON REPAIR Left 2000    knee   TONSILLECTOMY       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   amiodarone (PACERONE) 200 MG tablet Take 1 tablet (200 mg total) by mouth daily.   amLODipine (NORVASC) 5 MG tablet Take 1 tablet by mouth daily.   apixaban (ELIQUIS) 5 MG TABS tablet Take 1 tablet (5 mg total) by mouth 2 (two) times daily.   atorvastatin (LIPITOR) 40 MG tablet Take 40 mg by mouth daily. Take 1 tablet by mouth daily.   Cholecalciferol 25 MCG (1000 UT) TBDP Take 1 tablet by mouth daily.   empagliflozin (JARDIANCE) 25 MG TABS tablet Take 25 mg by mouth daily. Take 1/2 tablet by mouth daily.   furosemide (LASIX) 40 MG  tablet Take 1 tablet (40 mg total) by mouth daily as needed (for weight increase of 3 lbs overnight or 5 lbs in 1 week).   ipratropium (ATROVENT) 0.03 % nasal spray SPRAY 2 SPRAYS INTO EACH NOSTRIL TWICE A DAY FOR RUNNY NOSE   loratadine (CLARITIN) 10 MG tablet Take 10 mg by mouth daily as needed for allergies or rhinitis.   metoprolol succinate (TOPROL-XL) 50 MG 24 hr tablet TAKE 1 TABLET BY MOUTH DAILY. TAKE WITH OR IMMEDIATELY FOLLOWING A MEAL.   Multiple Vitamin (MULTI-VITAMIN) tablet Take 1 tablet by mouth daily.   Omega-3 Fatty Acids (FISH OIL) 1000 MG CAPS Take 1,000 mg by mouth 2 (two) times daily.   sacubitril-valsartan (ENTRESTO) 24-26 MG Take 1 tablet by mouth 2 (two) times daily.   spironolactone (ALDACTONE) 25 MG tablet Take 0.5 tablets (12.5 mg total) by mouth daily.        Allergies:   Patient has no known allergies.    Social History         Socioeconomic History   Marital status: Married      Spouse name: Not on file   Number of children: Not on file   Years of education: Not on file   Highest education level: Not on file  Occupational History   Not on file  Tobacco Use   Smoking status: Never   Smokeless tobacco: Former      Quit date: 10/29/1992  Substance and Sexual Activity   Alcohol use: No   Drug use: No   Sexual activity: Not on file  Other Topics Concern   Not on file  Social History Narrative   Not on file    Social Determinants of Health    Financial Resource Strain: Not on file  Food Insecurity: Not on file  Transportation Needs: Not on file  Physical Activity: Not on file  Stress: Not on file  Social Connections: Not on file      Family History: The patient's family history includes Brain cancer in his paternal uncle; Diabetes type II in his sister; Heart attack in his maternal uncle; Hypertension in his father and mother; Kidney disease in an other family member.   ROS:   Please see the history of present illness.    (+) Occasional  epistaxis (+) Easy bruising All other systems reviewed and are negative.   EKGs/Labs/Other Studies Reviewed:     The following studies were reviewed today:   January 21, 2022 SPECT-normal   November 02, 2021 echo-EF 50, moderate LVH, normal RV, mild MR   April 06, 2022 EKG  shows sinus rhythm, PAC, PVC       Recent Labs: 06/27/2021: B Natriuretic Peptide 232.0 07/04/2021: Magnesium 2.0 07/20/2021: Platelets 235 08/02/2021: Hemoglobin 14.3 01/19/2022: ALT 10; TSH 2.100 04/14/2022: BUN 15; Creatinine, Ser 0.90; Potassium 4.4; Sodium 137    Recent Lipid Panel Labs (Brief)  No results found for: "CHOL", "TRIG", "HDL", "CHOLHDL", "VLDL", "LDLCALC", "LDLDIRECT"     Physical Exam:     VS:  BP 148/73   Pulse 56   RR18 / Ht 5\' 8"  (1.727 m)   Wt 226 lb 9.6 oz (102.8 kg)   SpO2 98%   BMI 34.45 kg/m         Wt Readings from Last 3 Encounters:  05/09/22 226 lb 9.6 oz (102.8 kg)  04/06/22 226 lb (102.5 kg)  01/21/22 231 lb (104.8 kg)      GEN: Well nourished, well developed in no acute distress HEENT: Normal NECK: No JVD; No carotid bruits LYMPHATICS: No lymphadenopathy CARDIAC: RRR, no murmurs, rubs, gallops RESPIRATORY:  Clear to auscultation without rales, wheezing or rhonchi  ABDOMEN: Soft, non-tender, non-distended MUSCULOSKELETAL:  No edema; No deformity  SKIN: Warm and dry NEUROLOGIC:  Alert and oriented x 3 PSYCHIATRIC:  Normal affect          Assessment ASSESSMENT:     1. Persistent atrial fibrillation (HCC)   2. Chronic combined systolic and diastolic heart failure (HCC)   3. Primary hypertension   4. Encounter for long-term (current) use of high-risk medication     PLAN:     In order of problems listed above:   #Persistent atrial fibrillation Maintaining normal rhythm on amiodarone after cardioversion.  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  Discussed treatment options including catheter ablation and he wishes to proceed  in an effort to avoid long-term exposure to amiodarone.   Discussed treatment options today for AF including antiarrhythmic drug therapy and ablation. Discussed risks, recovery and likelihood of success with each treatment strategy. Risk, benefits, and alternatives to EP study and ablation for afib were discussed. These risks include but are not limited to stroke, bleeding, vascular damage, tamponade, perforation, damage to the esophagus, lungs, phrenic nerve and other structures, pulmonary vein stenosis, worsening renal function, coronary vasospasm and death.  Discussed potential need for repeat ablation procedures and antiarrhythmic drugs after an initial ablation. The patient understands these risk and wishes to proceed.  We will therefore proceed with catheter ablation at the next available time.  Carto, ICE, anesthesia are requested for the procedure.  Will also obtain CT PV protocol prior to the procedure to exclude LAA thrombus and further evaluate atrial anatomy.  Presents today for PVI. Procedure reviewed.    Signed, Rossie Muskrat. Lalla Brothers, MD, [facility], [facility] 12/09/2022 Electrophysiology [facility]

## 2022-12-09 NOTE — Transfer of Care (Signed)
Immediate Anesthesia Transfer of Care Note  Patient: Jeffrey Andrews  Procedure(s) Performed: ATRIAL FIBRILLATION ABLATION  Patient Location: PACU  Anesthesia Type:General  Level of Consciousness: awake and patient cooperative  Airway & Oxygen Therapy: Patient Spontanous Breathing and Patient connected to face mask oxygen  Post-op Assessment: Report given to RN and Post -op Vital signs reviewed and stable  Post vital signs: Reviewed and stable  Last Vitals:  Vitals Value Taken Time  BP    Temp    Pulse 69 12/09/22 0938  Resp 14 12/09/22 0938  SpO2 99 % 12/09/22 0938  Vitals shown include unfiled device data.  Last Pain:  Vitals:   12/09/22 0608  TempSrc: Temporal  PainSc:          Complications: No notable events documented.

## 2022-12-09 NOTE — Discharge Instructions (Signed)

## 2022-12-09 NOTE — Anesthesia Postprocedure Evaluation (Signed)
Anesthesia Post Note  Patient: Jeffrey Andrews  Procedure(s) Performed: ATRIAL FIBRILLATION ABLATION     Patient location during evaluation: PACU Anesthesia Type: General Level of consciousness: awake and alert Pain management: pain level controlled Vital Signs Assessment: post-procedure vital signs reviewed and stable Respiratory status: spontaneous breathing, nonlabored ventilation, respiratory function stable and patient connected to nasal cannula oxygen Cardiovascular status: blood pressure returned to baseline and stable Postop Assessment: no apparent nausea or vomiting Anesthetic complications: no   No notable events documented.  Last Vitals:  Vitals:   12/09/22 1245 12/09/22 1300  BP: 132/70 134/70  Pulse: 76 77  Resp: (!) 24 16  Temp:    SpO2: 94% 95%    Last Pain:  Vitals:   12/09/22 1015  TempSrc:   PainSc: 0-No pain                 Collene Schlichter

## 2022-12-09 NOTE — Anesthesia Procedure Notes (Signed)
Procedure Name: Intubation Date/Time: 12/09/2022 7:51 AM  Performed by: Orlin Hilding, CRNAPre-anesthesia Checklist: Patient identified, Emergency Drugs available, Suction available, Patient being monitored and Timeout performed Patient Re-evaluated:Patient Re-evaluated prior to induction Oxygen Delivery Method: Circle system utilized Preoxygenation: Pre-oxygenation with 100% oxygen Induction Type: IV induction Ventilation: Mask ventilation without difficulty Grade View: Grade II Tube type: Oral Tube size: 8.0 mm Number of attempts: 1 Placement Confirmation: ETT inserted through vocal cords under direct vision, positive ETCO2 and breath sounds checked- equal and bilateral Tube secured with: Tape Dental Injury: Teeth and Oropharynx as per pre-operative assessment

## 2022-12-12 ENCOUNTER — Encounter (HOSPITAL_COMMUNITY): Payer: Self-pay | Admitting: Cardiology

## 2022-12-21 ENCOUNTER — Telehealth: Payer: Self-pay

## 2022-12-21 NOTE — Telephone Encounter (Signed)
Called to offer LAAO date of 03/23/2023. He is already scheduled for visit with Dr. Lalla Brothers 10/16 which would be pre-procedure visit.  Left message to call back.

## 2022-12-23 NOTE — Telephone Encounter (Signed)
Patient returned call today and would like to be scheduled for Watchman implant 03/23/23. He has a 3 month post ablation appointment already scheduled with Dr. Lalla Brothers 03/15/23 therefore will use this appointment to review over Watchman details, given pre-op instructions, and obtain labs.   Georgie Chard NP-C Structural Heart Team  Pager: [phone] Phone: [phone]

## 2022-12-29 ENCOUNTER — Other Ambulatory Visit: Payer: Self-pay

## 2022-12-29 ENCOUNTER — Telehealth: Payer: Self-pay

## 2022-12-29 DIAGNOSIS — I4819 Other persistent atrial fibrillation: Secondary | ICD-10-CM

## 2023-01-04 ENCOUNTER — Ambulatory Visit: Payer: Medicare Other | Attending: Student | Admitting: Student

## 2023-01-04 ENCOUNTER — Encounter: Payer: Self-pay | Admitting: Student

## 2023-01-04 VITALS — BP 136/78 | HR 87 | Ht 68.0 in | Wt 220.6 lb

## 2023-01-04 DIAGNOSIS — I10 Essential (primary) hypertension: Secondary | ICD-10-CM

## 2023-01-04 DIAGNOSIS — I4819 Other persistent atrial fibrillation: Secondary | ICD-10-CM

## 2023-01-04 DIAGNOSIS — I5042 Chronic combined systolic (congestive) and diastolic (congestive) heart failure: Secondary | ICD-10-CM

## 2023-01-04 NOTE — Progress Notes (Signed)
  Electrophysiology Office Note:   Date:  01/04/2023  ID:  Jazziel, Mcmeans 27-Apr-1946, MRN [id number]  Primary Cardiologist: Little Ishikawa, MD Electrophysiologist: Lanier Prude, MD      History of Present Illness:   TIFFANY PERRA is a 77 y.o. male with h/o OSA, HTN, chronic systolic CHF, and AF seen today for routine electrophysiology followup.   S/p AF ablation 11/2022. Planning Watchman procedure 02/2023.  Since last being seen in our clinic the patient reports doing very well. Groins healed well, and has not noticed any breakthrough AF..  he denies chest pain, palpitations, dyspnea, PND, orthopnea, nausea, vomiting, dizziness, syncope, edema, weight gain, or early satiety.   Review of systems complete and found to be negative unless listed in HPI.   EP Information / Studies Reviewed:    EKG is ordered today. Personal review as below.  EKG Interpretation Date/Time:  Wednesday January 04 2023 11:10:25 EDT Ventricular Rate:  87 PR Interval:  208 QRS Duration:  96 QT Interval:  388 QTC Calculation: 466 R Axis:   13  Text Interpretation: Sinus rhythm with frequent Premature ventricular complexes When compared with ECG of 09-Dec-2022 09:45, No significant change was found Confirmed by Maxine Glenn [phone]) on 01/04/2023 11:13:55 AM    Physical Exam:   VS:  BP 136/78   Pulse 87   Ht 5\' 8"  (1.727 m)   Wt 220 lb 9.6 oz (100.1 kg)   SpO2 97%   BMI 33.54 kg/m    Wt Readings from Last 3 Encounters:  01/04/23 220 lb 9.6 oz (100.1 kg)  12/09/22 220 lb (99.8 kg)  11/17/22 222 lb 3.2 oz (100.8 kg)     GEN: Well nourished, well developed in no acute distress NECK: No JVD; No carotid bruits CARDIAC: Regular rate and rhythm, no murmurs, rubs, gallops RESPIRATORY:  Clear to auscultation without rales, wheezing or rhonchi  ABDOMEN: Soft, non-tender, non-distended EXTREMITIES:  No edema; No deformity   ASSESSMENT AND PLAN:    Persistent atrial fibrillation EKG today  shows NSR S/p ablation 12/09/2022. No breakthrough Continue amiodarone 200 mg daily at least until visit with Dr. Lalla Brothers in October.  Planning Watchman Procedure 03/23/2023  HTN Stable on current regimen   HF rec EF  Volume status stable   Echo 10/2021 LVEF 50-55%  PVCs On EKG.  Have not been quantified, but as above EF has normalized  Follow up with Dr. Lalla Brothers in October as scheduled pre Watchman.  Signed, Graciella Freer, PA-C

## 2023-01-04 NOTE — Patient Instructions (Signed)
Medication Instructions:  Your physician recommends that you continue on your current medications as directed. Please refer to the Current Medication list given to you today.  *If you need a refill on your cardiac medications before your next appointment, please call your pharmacy*  Lab Work: None ordered If you have labs (blood work) drawn today and your tests are completely normal, you will receive your results only by: MyChart Message (if you have MyChart) OR A paper copy in the mail If you have any lab test that is abnormal or we need to change your treatment, we will call you to review the results.  Follow-Up: At [facility], you and your health needs are our priority.  As part of our continuing mission to provide you with exceptional heart care, we have created designated Provider Care Teams.  These Care Teams include your primary Cardiologist (physician) and Advanced Practice Providers (APPs -  Physician Assistants and Nurse Practitioners) who all work together to provide you with the care you need, when you need it.  Your next appointment:   03/15/23 at 4:15 PM  Provider:   Steffanie Dunn, MD

## 2023-01-06 ENCOUNTER — Encounter (HOSPITAL_COMMUNITY): Payer: Self-pay

## 2023-01-06 ENCOUNTER — Ambulatory Visit (HOSPITAL_COMMUNITY): Payer: Medicare Other | Admitting: Internal Medicine

## 2023-02-05 NOTE — Progress Notes (Unsigned)
Cardiology Office Note:    Date:  02/06/2023   ID:  Jeffrey Andrews, DOB 07-Mar-1946, MRN [id number]  PCP:  Tenna Delaine, MD  Cardiologist:  Little Ishikawa, MD  Electrophysiologist:  Lanier Prude, MD   Referring MD: No ref. provider found   Chief Complaint  Patient presents with   Atrial Fibrillation    History of Present Illness:    Jeffrey Andrews is a 77 y.o. male with a hx of persistent atrial fibrillation, OSA, hypertension, heart failure with recovered ejection fraction who presents for follow-up.  He was admitted to [facility] 05/2021 with shortness of breath, found to be in atrial fibrillation with RVR.  Echocardiogram 06/28/2021 showed EF 35 to 40%.  Underwent successful TEE/DCCV with conversion to sinus rhythm.  He had recurrence of A-fib with RVR during admission and was started on amiodarone.  Repeat cardioversion scheduled for 08/02/2021 was canceled, as he converted back to sinus rhythm on amiodarone.  Repeat echocardiogram 11/02/2021 showed EF 50 to 55%, moderate LVH, grade 1 diastolic dysfunction, normal RV function, mild MR, mild dilatation of aortic root measuring 40 mm.  Labs on 01/06/2022 at [facility] showed LDL 70, A1c 6.6, hemoglobin 15.6, creatinine 0.9, potassium 3.3, sodium 135.  CT chest on 11/12/2021 showed no acute findings but noted to have extensive coronary artery calcifications.    Lexiscan Myoview on 01/21/2022 showed normal perfusion, EF 51%  Since last clinic visit, he reports he is doing well.  Denies any chest pain, dyspnea, lightheadedness, syncope, lower extremity edema, or palpitations.  Denies any bleeding issues on Eliquis.  Wt Readings from Last 3 Encounters:  02/06/23 220 lb 6.4 oz (100 kg)  01/04/23 220 lb 9.6 oz (100.1 kg)  12/09/22 220 lb (99.8 kg)     Past Medical History:  Diagnosis Date   Arthritis    Cancer (HCC) 2014   skin cancer left arm   Excessive sweating    Glucose intolerance 06/27/2021   Heart murmur    "functional heart  murmur"   History of kidney stones    History of skin cancer    Hypertension    Sleep apnea    uses c-pap    Past Surgical History:  Procedure Laterality Date   ATRIAL FIBRILLATION ABLATION N/A 12/09/2022   Procedure: ATRIAL FIBRILLATION ABLATION;  Surgeon: Lanier Prude, MD;  Location: [facility] INVASIVE CV LAB;  Service: Cardiovascular;  Laterality: N/A;   BACK SURGERY     x3 last back surgery 1984   CARDIOVERSION N/A 07/01/2021   Procedure: CARDIOVERSION;  Surgeon: Chilton Si, MD;  Location: [facility] ENDOSCOPY;  Service: Cardiovascular;  Laterality: N/A;   EYE SURGERY     cataracts with lens implants   STERIOD INJECTION Right 06/08/2015   Procedure: STEROID INJECTION;  Surgeon: Ollen Gross, MD;  Location: [facility] ORS;  Service: Orthopedics;  Laterality: Right;   TEE WITHOUT CARDIOVERSION N/A 07/01/2021   Procedure: TRANSESOPHAGEAL ECHOCARDIOGRAM (TEE);  Surgeon: Chilton Si, MD;  Location: [facility] ENDOSCOPY;  Service: Cardiovascular;  Laterality: N/A;   TENDON REPAIR Left 2000   knee   TONSILLECTOMY     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   amiodarone (PACERONE) 200 MG tablet Take 1 tablet (200 mg total) by mouth daily.   amLODipine (NORVASC) 5 MG tablet Take 5 mg by mouth daily.   apixaban (ELIQUIS) 5 MG TABS tablet Take 1 tablet (5 mg total) by mouth 2 (two) times daily.   atorvastatin (LIPITOR) 40 MG tablet Take 40 mg by mouth daily.   Cholecalciferol 25 MCG (1000 UT) TBDP Take 1,000 Units by mouth daily.    empagliflozin (JARDIANCE) 25 MG TABS tablet Take 12.5 mg by mouth daily.   ipratropium (ATROVENT) 0.03 % nasal spray Place 2 sprays into both nostrils 2 (two) times daily as needed for rhinitis.   metoprolol succinate (TOPROL-XL) 50 MG 24 hr tablet Take 1 tablet (50 mg total) by mouth daily. TAKE WITH OR IMMEDIATELY FOLLOWING A MEAL.   Multiple Vitamin (MULTI-VITAMIN) tablet Take 1 tablet by mouth daily.   sacubitril-valsartan (ENTRESTO) 24-26 MG Take 1 tablet by mouth 2 (two) times daily.   [DISCONTINUED] spironolactone (ALDACTONE) 25 MG tablet Take 0.5 tablets (12.5 mg total) by mouth daily.     Allergies:   Fluticasone   Social History   Socioeconomic History   Marital status: Married    Spouse name: Not on file   Number of children: Not on file   Years of education: Not on file   Highest education level: Not on file  Occupational History   Not on file  Tobacco Use   Smoking status: Never   Smokeless tobacco: Former    Quit date: 10/29/1992  Substance and Sexual Activity   Alcohol use: No   Drug use: No   Sexual activity: Not on file  Other Topics Concern   Not on file  Social History Narrative   Not on file   Social Determinants of Health   Financial Resource Strain: Not on file  Food Insecurity: Not on file  Transportation Needs: Not on file  Physical Activity: Not on file  Stress: Not on file  Social Connections: Not on file     Family History: The patient's family history includes Brain cancer in his paternal uncle; Diabetes type II in his sister; Heart attack in his maternal uncle; Hypertension in his father and mother; Kidney disease in an other family member.  ROS:   Please see the history of present illness.     All other systems reviewed and are negative.  EKGs/Labs/Other Studies Reviewed:    The following studies were reviewed today:  EKG:   02/06/2023: Sinus rhythm, rate 64, PVCs 08/05/22:NST, PVC, rate 74, Qtc 481 04/06/2022: Normal sinus rhythm, PVCs,  rate 69  01/19/2022: Normal sinus rhythm, rate 73, no ST abnormality  Recent Labs: 11/17/2022: ALT 16; BUN 14; Creatinine, Ser 0.77; Hemoglobin 15.0; Platelets 204; Potassium 3.8; Sodium 140; TSH 1.450  Recent Lipid Panel No results found for: "CHOL", "TRIG", "HDL", "CHOLHDL", "VLDL", "LDLCALC", "LDLDIRECT"  Physical Exam:    VS:  BP 132/86   Pulse 64   Ht 5\' 8"  (1.727 m)   Wt 220 lb 6.4 oz (100 kg)   SpO2 98%   BMI 33.51 kg/m     Wt Readings from Last 3 Encounters:  02/06/23 220 lb 6.4 oz (100 kg)  01/04/23 220 lb 9.6 oz (100.1 kg)  12/09/22 220 lb (99.8 kg)     GEN:  Well nourished, well developed in no  acute distress HEENT: Normal NECK: No JVD; No carotid bruits CARDIAC: RRR, no murmurs, rubs, gallops RESPIRATORY:  Clear to auscultation without rales, wheezing or rhonchi  ABDOMEN: Soft, non-tender, non-distended MUSCULOSKELETAL:  No edema; No deformity  SKIN: Warm and dry NEUROLOGIC:  Alert and oriented x 3 PSYCHIATRIC:  Normal affect   ASSESSMENT:    1. Persistent atrial fibrillation (HCC)   2. PVC's (premature ventricular contractions)   3. Heart failure with recovered ejection fraction (HFrecEF) (HCC)   4. Coronary artery disease involving native coronary artery of native heart without angina pectoris   5. Essential hypertension   6. Hyperlipidemia, unspecified hyperlipidemia type      PLAN:     Persistent atrial fibrillation: admitted to [facility] 05/2021 with shortness of breath, found to be in atrial fibrillation with RVR.  Echocardiogram 06/28/2021 showed EF 35 to 40%.  Underwent successful TEE/DCCV with convers TEE ion to sinus rhythm.  He had recurrence of A-fib with RVR during admission and was started on amiodarone.  Repeat cardioversion scheduled for 08/02/2021 was canceled, as he converted back to sinus rhythm on amiodarone.  Repeat echocardiogram 11/02/2021 showed EF 50 to 55%, moderate LVH, grade 1 diastolic dysfunction, normal RV function, mild MR, mild  dilatation of aortic root measuring 40 mm. -Continue Eliquis 5 mg twice daily -Continue Toprol-XL 50 mg daily -Continue amiodarone 200 mg daily.  Normal TSH, LFTs 10/2022. 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  Referred to EP.  Seen by Dr. Lalla Brothers, underwent ablation 12/09/2022.  Planning Watchman 03/23/2023  Heart failure with recovered EF: Echocardiogram 06/28/2021 showed EF 35 to 40%.  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  With restoration of sinus rhythm, repeat echocardiogram 11/02/2021 showed improvement in EF to 50 to 55%. -Continue Entresto 24-26 mg twice daily -Continue Toprol-XL 25 mg daily -Continue Jardiance -He is requesting to stop spironolactone, will discontinue. -Check BMET -Update echocardiogram -Appears euvolemic.  On Lasix as needed if gains more than 3 pounds in 1 day or 5 pounds in 1 week  PVCs: Frequent PVCs noted on EKG in clinic today.  Will check Zio patch x 3 days to quantify PVC burden.  Update echocardiogram as above.  Check BMET, magnesium  CAD: Calcium score 436 (58th percentile) 11/2022.  Lexiscan Myoview on 01/21/2022 showed normal perfusion, EF 51%  Hypertension: On Entresto, Toprol-XL, spironolactone.  Appears controlled  Hyperlipidemia: On atorvastatin 40 mg daily.  LDL 70 on 12/2021  RTC in 6 months  Medication Adjustments/Labs and Tests Ordered: Current medicines are reviewed at length with the patient today.  Concerns regarding medicines are outlined above.  Orders Placed This Encounter  Procedures   Basic metabolic panel   Magnesium   LONG TERM MONITOR (3-14 DAYS)   EKG 12-Lead   ECHOCARDIOGRAM COMPLETE   No orders of the defined types were placed in this encounter.   Patient Instructions  Medication Instructions:  Stop Spironolactone. *If you need a refill on your cardiac medications before your next appointment, please call your pharmacy*   Lab Work: BMET, Magnesium Level. If you  have labs (blood work) drawn today and your tests are completely normal, you will receive your results only by: MyChart Message (if you have MyChart) OR A paper copy in the mail If you have any lab test that is abnormal or we need to change your treatment, we will call you to review the results.   Testing/Procedures: [address], [address]. Your physician has requested that you have an echocardiogram. Echocardiography  is a painless test that uses sound waves to create images of your heart. It provides your doctor with information about the size and shape of your heart and how well your heart's chambers and valves are working. This procedure takes approximately one hour. There are no restrictions for this procedure. Please do NOT wear cologne, perfume, aftershave, or lotions (deodorant is allowed). Please arrive 15 minutes prior to your appointment time.   ZIO XT- Long Term Monitor Instructions  Your physician has requested you wear a ZIO patch monitor for 3 days.  This is a single patch monitor. [employer] supplies one patch monitor per enrollment. Additional stickers are not available. Please do not apply patch if you will be having a Nuclear Stress Test,  Echocardiogram, Cardiac CT, MRI, or Chest Xray during the period you would be wearing the  monitor. The patch cannot be worn during these tests. You cannot remove and re-apply the  ZIO XT patch monitor.  Your ZIO patch monitor will be mailed 3 day USPS to your address on file. It may take 3-5 days  to receive your monitor after you have been enrolled.  Once you have received your monitor, please review the enclosed instructions. Your monitor  has already been registered assigning a specific monitor serial # to you.  Billing and Patient Assistance Program Information  We have supplied [employer] with any of your insurance information on file for billing purposes. [employer] offers a sliding scale Patient Assistance Program for  patients that do not have  insurance, or whose insurance does not completely cover the cost of the ZIO monitor.  You must apply for the Patient Assistance Program to qualify for this discounted rate.  To apply, please call [employer] at [phone], select option 4, select option 2, ask to apply for  Patient Assistance Program. Meredeth Ide will ask your household income, and how many people  are in your household. They will quote your out-of-pocket cost based on that information.  [employer] will also be able to set up a 35-month, interest-free payment plan if needed.  Applying the monitor   Shave hair from upper left chest.  Hold abrader disc by orange tab. Rub abrader in 40 strokes over the upper left chest as  indicated in your monitor instructions.  Clean area with 4 enclosed alcohol pads. Let dry.  Apply patch as indicated in monitor instructions. Patch will be placed under collarbone on left  side of chest with arrow pointing upward.  Rub patch adhesive wings for 2 minutes. Remove white label marked "1". Remove the white  label marked "2". Rub patch adhesive wings for 2 additional minutes.  While looking in a mirror, press and release button in center of patch. A small green light will  flash 3-4 times. This will be your only indicator that the monitor has been turned on.  Do not shower for the first 24 hours. You may shower after the first 24 hours.  Press the button if you feel a symptom. You will hear a small click. Record Date, Time and  Symptom in the Patient Logbook.  When you are ready to remove the patch, follow instructions on the last 2 pages of Patient  Logbook. Stick patch monitor onto the last page of Patient Logbook.  Place Patient Logbook in the blue and white box. Use locking tab on box and tape box closed  securely. The blue and white box has prepaid postage on it. Please place it in the mailbox as  soon as  possible. Your physician should have your test results approximately  7 days after the  monitor has been mailed back to [facility].  Call [facility] Customer Care at [phone] if you have questions regarding  your ZIO XT patch monitor. Call them immediately if you see an orange light blinking on your  monitor.  If your monitor falls off in less than 4 days, contact our Monitor department at [phone].  If your monitor becomes loose or falls off after 4 days call [employer] at [phone] for  suggestions on securing your monitor   Follow-Up: At [facility], you and your health needs are our priority.  As part of our continuing mission to provide you with exceptional heart care, we have created designated Provider Care Teams.  These Care Teams include your primary Cardiologist (physician) and Advanced Practice Providers (APPs -  Physician Assistants and Nurse Practitioners) who all work together to provide you with the care you need, when you need it.  We recommend signing up for the patient portal called "MyChart".  Sign up information is provided on this After Visit Summary.  MyChart is used to connect with patients for Virtual Visits (Telemedicine).  Patients are able to view lab/test results, encounter notes, upcoming appointments, etc.  Non-urgent messages can be sent to your provider as well.   To learn more about what you can do with MyChart, go to [email or web address].    Your next appointment:   6 month(s)  Provider:   Little Ishikawa, MD     Signed, Little Ishikawa, MD  02/06/2023 8:39 AM    [facility]

## 2023-02-06 ENCOUNTER — Ambulatory Visit: Payer: Medicare Other | Attending: Cardiology

## 2023-02-06 ENCOUNTER — Encounter: Payer: Self-pay | Admitting: Cardiology

## 2023-02-06 ENCOUNTER — Ambulatory Visit: Payer: Medicare Other | Attending: Cardiology | Admitting: Cardiology

## 2023-02-06 VITALS — BP 132/86 | HR 64 | Ht 68.0 in | Wt 220.4 lb

## 2023-02-06 DIAGNOSIS — I10 Essential (primary) hypertension: Secondary | ICD-10-CM | POA: Diagnosis not present

## 2023-02-06 DIAGNOSIS — I4819 Other persistent atrial fibrillation: Secondary | ICD-10-CM

## 2023-02-06 DIAGNOSIS — E785 Hyperlipidemia, unspecified: Secondary | ICD-10-CM

## 2023-02-06 DIAGNOSIS — I2510 Atherosclerotic heart disease of native coronary artery without angina pectoris: Secondary | ICD-10-CM

## 2023-02-06 DIAGNOSIS — I493 Ventricular premature depolarization: Secondary | ICD-10-CM

## 2023-02-06 DIAGNOSIS — I4891 Unspecified atrial fibrillation: Secondary | ICD-10-CM | POA: Diagnosis not present

## 2023-02-06 DIAGNOSIS — I5020 Unspecified systolic (congestive) heart failure: Secondary | ICD-10-CM

## 2023-02-06 DIAGNOSIS — I5032 Chronic diastolic (congestive) heart failure: Secondary | ICD-10-CM | POA: Diagnosis not present

## 2023-02-06 NOTE — Patient Instructions (Addendum)
Medication Instructions:  Stop Spironolactone. *If you need a refill on your cardiac medications before your next appointment, please call your pharmacy*   Lab Work: BMET, Magnesium Level. If you have labs (blood work) drawn today and your tests are completely normal, you will receive your results only by: MyChart Message (if you have MyChart) OR A paper copy in the mail If you have any lab test that is abnormal or we need to change your treatment, we will call you to review the results.   Testing/Procedures: [address], [address]. Your physician has requested that you have an echocardiogram. Echocardiography is a painless test that uses sound waves to create images of your heart. It provides your doctor with information about the size and shape of your heart and how well your heart's chambers and valves are working. This procedure takes approximately one hour. There are no restrictions for this procedure. Please do NOT wear cologne, perfume, aftershave, or lotions (deodorant is allowed). Please arrive 15 minutes prior to your appointment time.   ZIO XT- Long Term Monitor Instructions  Your physician has requested you wear a ZIO patch monitor for 3 days.  This is a single patch monitor. [employer] supplies one patch monitor per enrollment. Additional stickers are not available. Please do not apply patch if you will be having a Nuclear Stress Test,  Echocardiogram, Cardiac CT, MRI, or Chest Xray during the period you would be wearing the  monitor. The patch cannot be worn during these tests. You cannot remove and re-apply the  ZIO XT patch monitor.  Your ZIO patch monitor will be mailed 3 day USPS to your address on file. It may take 3-5 days  to receive your monitor after you have been enrolled.  Once you have received your monitor, please review the enclosed instructions. Your monitor  has already been registered assigning a specific monitor serial # to you.  Billing and  Patient Assistance Program Information  We have supplied [employer] with any of your insurance information on file for billing purposes. [employer] offers a sliding scale Patient Assistance Program for patients that do not have  insurance, or whose insurance does not completely cover the cost of the ZIO monitor.  You must apply for the Patient Assistance Program to qualify for this discounted rate.  To apply, please call [employer] at [phone], select option 4, select option 2, ask to apply for  Patient Assistance Program. Meredeth Ide will ask your household income, and how many people  are in your household. They will quote your out-of-pocket cost based on that information.  [employer] will also be able to set up a 18-month, interest-free payment plan if needed.  Applying the monitor   Shave hair from upper left chest.  Hold abrader disc by orange tab. Rub abrader in 40 strokes over the upper left chest as  indicated in your monitor instructions.  Clean area with 4 enclosed alcohol pads. Let dry.  Apply patch as indicated in monitor instructions. Patch will be placed under collarbone on left  side of chest with arrow pointing upward.  Rub patch adhesive wings for 2 minutes. Remove white label marked "1". Remove the white  label marked "2". Rub patch adhesive wings for 2 additional minutes.  While looking in a mirror, press and release button in center of patch. A small green light will  flash 3-4 times. This will be your only indicator that the monitor has been turned on.  Do not shower for the first  24 hours. You may shower after the first 24 hours.  Press the button if you feel a symptom. You will hear a small click. Record Date, Time and  Symptom in the Patient Logbook.  When you are ready to remove the patch, follow instructions on the last 2 pages of Patient  Logbook. Stick patch monitor onto the last page of Patient Logbook.  Place Patient Logbook in the blue and white box. Use locking tab on  box and tape box closed  securely. The blue and white box has prepaid postage on it. Please place it in the mailbox as  soon as possible. Your physician should have your test results approximately 7 days after the  monitor has been mailed back to [facility].  Call [facility] Customer Care at [phone] if you have questions regarding  your ZIO XT patch monitor. Call them immediately if you see an orange light blinking on your  monitor.  If your monitor falls off in less than 4 days, contact our Monitor department at [phone].  If your monitor becomes loose or falls off after 4 days call [employer] at [phone] for  suggestions on securing your monitor   Follow-Up: At [facility], you and your health needs are our priority.  As part of our continuing mission to provide you with exceptional heart care, we have created designated Provider Care Teams.  These Care Teams include your primary Cardiologist (physician) and Advanced Practice Providers (APPs -  Physician Assistants and Nurse Practitioners) who all work together to provide you with the care you need, when you need it.  We recommend signing up for the patient portal called "MyChart".  Sign up information is provided on this After Visit Summary.  MyChart is used to connect with patients for Virtual Visits (Telemedicine).  Patients are able to view lab/test results, encounter notes, upcoming appointments, etc.  Non-urgent messages can be sent to your provider as well.   To learn more about what you can do with MyChart, go to [email or web address].    Your next appointment:   6 month(s)  Provider:   Little Ishikawa, MD

## 2023-02-06 NOTE — Progress Notes (Unsigned)
Enrolled for [employer] to mail a ZIO XT long term holter monitor to the patients address on file.  

## 2023-02-07 LAB — BASIC METABOLIC PANEL
BUN/Creatinine Ratio: 17 (ref 10–24)
BUN: 14 mg/dL (ref 8–27)
CO2: 26 mmol/L (ref 20–29)
Calcium: 9.4 mg/dL (ref 8.6–10.2)
Chloride: 102 mmol/L (ref 96–106)
Creatinine, Ser: 0.83 mg/dL (ref 0.76–1.27)
Glucose: 139 mg/dL — ABNORMAL HIGH (ref 70–99)
Potassium: 4.4 mmol/L (ref 3.5–5.2)
Sodium: 140 mmol/L (ref 134–144)
eGFR: 90 mL/min/{1.73_m2} (ref >59)

## 2023-02-07 LAB — MAGNESIUM: Magnesium: 2.1 mg/dL (ref 1.6–2.3)

## 2023-02-08 DIAGNOSIS — I493 Ventricular premature depolarization: Secondary | ICD-10-CM

## 2023-02-08 DIAGNOSIS — I4819 Other persistent atrial fibrillation: Secondary | ICD-10-CM | POA: Diagnosis not present

## 2023-02-17 DIAGNOSIS — I4819 Other persistent atrial fibrillation: Secondary | ICD-10-CM | POA: Diagnosis not present

## 2023-02-17 DIAGNOSIS — I493 Ventricular premature depolarization: Secondary | ICD-10-CM | POA: Diagnosis not present

## 2023-02-20 ENCOUNTER — Ambulatory Visit (HOSPITAL_COMMUNITY): Payer: Medicare Other | Attending: Cardiology

## 2023-02-20 DIAGNOSIS — I493 Ventricular premature depolarization: Secondary | ICD-10-CM | POA: Insufficient documentation

## 2023-02-20 DIAGNOSIS — I4819 Other persistent atrial fibrillation: Secondary | ICD-10-CM | POA: Diagnosis not present

## 2023-02-20 LAB — ECHOCARDIOGRAM COMPLETE
Area-P 1/2: 3.53 cm2
S' Lateral: 3.7 cm

## 2023-03-03 ENCOUNTER — Telehealth: Payer: Self-pay

## 2023-03-04 ENCOUNTER — Other Ambulatory Visit: Payer: Self-pay | Admitting: Cardiology

## 2023-03-15 ENCOUNTER — Encounter: Payer: Self-pay | Admitting: Cardiology

## 2023-03-15 ENCOUNTER — Ambulatory Visit: Payer: Medicare Other | Attending: Cardiology | Admitting: Cardiology

## 2023-03-15 VITALS — BP 124/78 | HR 81 | Ht 68.0 in | Wt 218.0 lb

## 2023-03-15 DIAGNOSIS — Z79899 Other long term (current) drug therapy: Secondary | ICD-10-CM

## 2023-03-15 DIAGNOSIS — I10 Essential (primary) hypertension: Secondary | ICD-10-CM

## 2023-03-15 DIAGNOSIS — I493 Ventricular premature depolarization: Secondary | ICD-10-CM

## 2023-03-15 DIAGNOSIS — I4819 Other persistent atrial fibrillation: Secondary | ICD-10-CM

## 2023-03-15 DIAGNOSIS — I5020 Unspecified systolic (congestive) heart failure: Secondary | ICD-10-CM

## 2023-03-15 DIAGNOSIS — I5032 Chronic diastolic (congestive) heart failure: Secondary | ICD-10-CM

## 2023-03-15 NOTE — H&P (View-Only) (Signed)
Electrophysiology Office Follow up Visit Note:    Date:  03/15/2023   ID:  Jeffrey Andrews, DOB 1945/08/16, MRN [id number]  PCP:  Tenna Delaine, MD  [facility] Cardiologist:  Little Ishikawa, MD  [facility] Electrophysiologist:  Lanier Prude, MD    Interval History:    Jeffrey Andrews is a 77 y.o. male who presents for a follow up visit.   The patient had an A-fib ablation December 09, 2022 during which the pulmonary veins were isolated.  He is scheduled for watchman implant on October 24. The patient saw Mardelle Matte in clinic January 04, 2019 for.  He was doing well at that appointment without recurrence of his arrhythmia.  He was continued on amiodarone until at least today's appointment.  He has been doing well after his catheter ablation without recurrence.  He has stopped the amiodarone.    Past medical, surgical, social and family history were reviewed.  ROS:   Please see the history of present illness.    All other systems reviewed and are negative.  EKGs/Labs/Other Studies Reviewed:    The following studies were reviewed today:  February 20, 2023 echo EF 60% RV normal Mild MR  February 06, 2023 EKG shows sinus rhythm with frequent PVCs  EKG Interpretation Date/Time:  Wednesday March 15 2023 16:14:42 EDT Ventricular Rate:  81 PR Interval:  190 QRS Duration:  98 QT Interval:  400 QTC Calculation: 464 R Axis:   35  Text Interpretation: Sinus rhythm with frequent Premature ventricular complexes Confirmed by Steffanie Dunn [phone]) on 03/15/2023 4:37:52 PM    Physical Exam:    VS:  BP 124/78   Pulse 81   Ht 5\' 8"  (1.727 m)   Wt 218 lb (98.9 kg)   SpO2 98%   BMI 33.15 kg/m     Wt Readings from Last 3 Encounters:  03/15/23 218 lb (98.9 kg)  02/06/23 220 lb 6.4 oz (100 kg)  01/04/23 220 lb 9.6 oz (100.1 kg)     GEN:  Well nourished, well developed in no acute distress CARDIAC: Irregular rhythm, no murmurs, rubs, gallops RESPIRATORY:   Clear to auscultation without rales, wheezing or rhonchi    HAS-BLED score 2 Hypertension Yes  Abnormal renal and liver function (Dialysis, transplant, Cr >2.26 mg/dL /Cirrhosis or Bilirubin >2x Normal or AST/ALT/AP >3x Normal) No  Stroke No  Bleeding No  Labile INR (Unstable/high INR) No  Elderly (>65) Yes  Drugs or alcohol (>= 8 drinks/week, anti-plt or NSAID) No    CHA2DS2-VASc Score = 5  The patient's score is based upon: CHF History: 0 HTN History: 1 Diabetes History: 1 Stroke History: 0 Vascular Disease History: 1 Age Score: 2 Gender Score: 0   ASSESSMENT:    1. Persistent atrial fibrillation (HCC)   2. PVC's (premature ventricular contractions)   3. Heart failure with recovered ejection fraction (HFrecEF) (HCC)   4. Primary hypertension   5. Encounter for long-term (current) use of high-risk medication    PLAN:    In order of problems listed above:  #Persistent atrial fibrillation #High risk drug monitoring-amiodarone Doing well after his catheter ablation. Given history of epistaxis and medication missed doses, patient is planned for left atrial appendage occlusion later this month.  Procedural risks for the Watchman implant have been reviewed with the patient including a 0.5% risk of stroke, <1% risk of perforation and <1% risk of device embolization. Other risks include bleeding, vascular damage, tamponade, worsening renal function, and death.  The patient understands these risk and wishes to proceed.      #Frequent PVCs 20% burden of PVCs on September ZIO monitor EF normal. Continue metoprolol  #Hypertension At goal today.  Recommend checking blood pressures 1-2 times per week at home and recording the values.  Recommend bringing these recordings to the primary care physician.    Signed, Steffanie Dunn, MD, [facility], [facility] 03/15/2023 5:25 PM    Electrophysiology [facility]

## 2023-03-15 NOTE — Progress Notes (Signed)
Electrophysiology Office Follow up Visit Note:    Date:  03/15/2023   ID:  Jeffrey Andrews, DOB 1945/08/16, MRN [id number]  PCP:  Tenna Delaine, MD  [facility] Cardiologist:  Little Ishikawa, MD  [facility] Electrophysiologist:  Lanier Prude, MD    Interval History:    Jeffrey Andrews is a 77 y.o. male who presents for a follow up visit.   The patient had an A-fib ablation December 09, 2022 during which the pulmonary veins were isolated.  He is scheduled for watchman implant on October 24. The patient saw Mardelle Matte in clinic January 04, 2019 for.  He was doing well at that appointment without recurrence of his arrhythmia.  He was continued on amiodarone until at least today's appointment.  He has been doing well after his catheter ablation without recurrence.  He has stopped the amiodarone.    Past medical, surgical, social and family history were reviewed.  ROS:   Please see the history of present illness.    All other systems reviewed and are negative.  EKGs/Labs/Other Studies Reviewed:    The following studies were reviewed today:  February 20, 2023 echo EF 60% RV normal Mild MR  February 06, 2023 EKG shows sinus rhythm with frequent PVCs  EKG Interpretation Date/Time:  Wednesday March 15 2023 16:14:42 EDT Ventricular Rate:  81 PR Interval:  190 QRS Duration:  98 QT Interval:  400 QTC Calculation: 464 R Axis:   35  Text Interpretation: Sinus rhythm with frequent Premature ventricular complexes Confirmed by Steffanie Dunn [phone]) on 03/15/2023 4:37:52 PM    Physical Exam:    VS:  BP 124/78   Pulse 81   Ht 5\' 8"  (1.727 m)   Wt 218 lb (98.9 kg)   SpO2 98%   BMI 33.15 kg/m     Wt Readings from Last 3 Encounters:  03/15/23 218 lb (98.9 kg)  02/06/23 220 lb 6.4 oz (100 kg)  01/04/23 220 lb 9.6 oz (100.1 kg)     GEN:  Well nourished, well developed in no acute distress CARDIAC: Irregular rhythm, no murmurs, rubs, gallops RESPIRATORY:   Clear to auscultation without rales, wheezing or rhonchi    HAS-BLED score 2 Hypertension Yes  Abnormal renal and liver function (Dialysis, transplant, Cr >2.26 mg/dL /Cirrhosis or Bilirubin >2x Normal or AST/ALT/AP >3x Normal) No  Stroke No  Bleeding No  Labile INR (Unstable/high INR) No  Elderly (>65) Yes  Drugs or alcohol (>= 8 drinks/week, anti-plt or NSAID) No    CHA2DS2-VASc Score = 5  The patient's score is based upon: CHF History: 0 HTN History: 1 Diabetes History: 1 Stroke History: 0 Vascular Disease History: 1 Age Score: 2 Gender Score: 0   ASSESSMENT:    1. Persistent atrial fibrillation (HCC)   2. PVC's (premature ventricular contractions)   3. Heart failure with recovered ejection fraction (HFrecEF) (HCC)   4. Primary hypertension   5. Encounter for long-term (current) use of high-risk medication    PLAN:    In order of problems listed above:  #Persistent atrial fibrillation #High risk drug monitoring-amiodarone Doing well after his catheter ablation. Given history of epistaxis and medication missed doses, patient is planned for left atrial appendage occlusion later this month.  Procedural risks for the Watchman implant have been reviewed with the patient including a 0.5% risk of stroke, <1% risk of perforation and <1% risk of device embolization. Other risks include bleeding, vascular damage, tamponade, worsening renal function, and death.  The patient understands these risk and wishes to proceed.      #Frequent PVCs 20% burden of PVCs on September ZIO monitor EF normal. Continue metoprolol  #Hypertension At goal today.  Recommend checking blood pressures 1-2 times per week at home and recording the values.  Recommend bringing these recordings to the primary care physician.    Signed, Steffanie Dunn, MD, [facility], [facility] 03/15/2023 5:25 PM    Electrophysiology [facility]

## 2023-03-15 NOTE — Patient Instructions (Signed)
Medication Instructions:  Your physician recommends that you continue on your current medications as directed. Please refer to the Current Medication list given to you today.  *If you need a refill on your cardiac medications before your next appointment, please call your pharmacy*  Lab Work: TODAY: BMET and CBC If you have labs (blood work) drawn today and your tests are completely normal, you will receive your results only by: MyChart Message (if you have MyChart) OR A paper copy in the mail If you have any lab test that is abnormal or we need to change your treatment, we will call you to review the results.  Follow-Up: At [facility], you and your health needs are our priority.  As part of our continuing mission to provide you with exceptional heart care, we have created designated Provider Care Teams.  These Care Teams include your primary Cardiologist (physician) and Advanced Practice Providers (APPs -  Physician Assistants and Nurse Practitioners) who all work together to provide you with the care you need, when you need it.  Your next appointment:   As scheduled

## 2023-03-16 ENCOUNTER — Ambulatory Visit: Payer: Medicare Other | Attending: Cardiology

## 2023-03-16 DIAGNOSIS — I5032 Chronic diastolic (congestive) heart failure: Secondary | ICD-10-CM

## 2023-03-16 DIAGNOSIS — I4819 Other persistent atrial fibrillation: Secondary | ICD-10-CM

## 2023-03-16 DIAGNOSIS — I10 Essential (primary) hypertension: Secondary | ICD-10-CM | POA: Diagnosis not present

## 2023-03-16 DIAGNOSIS — Z79899 Other long term (current) drug therapy: Secondary | ICD-10-CM | POA: Diagnosis not present

## 2023-03-16 DIAGNOSIS — I493 Ventricular premature depolarization: Secondary | ICD-10-CM

## 2023-03-16 DIAGNOSIS — I5020 Unspecified systolic (congestive) heart failure: Secondary | ICD-10-CM

## 2023-03-17 LAB — BASIC METABOLIC PANEL
BUN/Creatinine Ratio: 18 (ref 10–24)
BUN: 13 mg/dL (ref 8–27)
CO2: 24 mmol/L (ref 20–29)
Calcium: 9 mg/dL (ref 8.6–10.2)
Chloride: 102 mmol/L (ref 96–106)
Creatinine, Ser: 0.73 mg/dL — ABNORMAL LOW (ref 0.76–1.27)
Glucose: 147 mg/dL — ABNORMAL HIGH (ref 70–99)
Potassium: 4 mmol/L (ref 3.5–5.2)
Sodium: 141 mmol/L (ref 134–144)
eGFR: 94 mL/min/{1.73_m2} (ref >59)

## 2023-03-17 LAB — CBC
Hematocrit: 46 % (ref 37.5–51.0)
Hemoglobin: 15.1 g/dL (ref 13.0–17.7)
MCH: 33.3 pg — ABNORMAL HIGH (ref 26.6–33.0)
MCHC: 32.8 g/dL (ref 31.5–35.7)
MCV: 102 fL — ABNORMAL HIGH (ref 79–97)
Platelets: 213 10*3/uL (ref 150–450)
RBC: 4.53 x10E6/uL (ref 4.14–5.80)
RDW: 12.6 % (ref 11.6–15.4)
WBC: 6.5 10*3/uL (ref 3.4–10.8)

## 2023-03-19 ENCOUNTER — Other Ambulatory Visit: Payer: Self-pay | Admitting: Cardiology

## 2023-03-22 ENCOUNTER — Telehealth: Payer: Self-pay

## 2023-03-22 NOTE — Telephone Encounter (Signed)
Opened chart to complete pre-Watchman call. Upon chart review, Short Stay called the patient this AM and reviewed instructions with the patient. Copied and pasted:   Tillman Abide, RN  Registered Nurse   Progress Notes    Addendum   Date of Service: 03/22/2023  8:29 AM    PCP -  Tenna Delaine, MD Cardiologist - Little Ishikawa, MD  Electrophysiologist Lanier Prude, MD    PPM/ICD -  Device Orders -  Rep Notified -    Chest x-ray - 06-27-21 EKG - 03-15-23 Stress Test -  ECHO - 02-20-23 Cardiac Cath -    CPAP -    DM- yes empagliflozin (JARDIANCE) LAST DOSE March 19, 2023 per instructions Blood Thinner Instructions: apixaban (ELIQUIS) Follow instructions provided by surgeon  Aspirin Instructions:    ERAS Protcol - NPO   COVID TEST- no   Anesthesia review: no   Patient verbally denies any shortness of breath, fever, cough and chest pain during phone call     -------------  SDW INSTRUCTIONS given:   Your procedure is scheduled on March 23, 2023.             Report to [facility] Main Entrance "A" at 11:00 A.M., and check in at the Admitting office.             Call this number if you have problems the morning of surgery:             [phone]               Remember:             Do not eat or drink after midnight the night before your surgery                            Take these medicines the morning of surgery with A SIP OF WATER  amLODipine (NORVASC)  apixaban (ELIQUIS)  metoprolol succinate (TOPROL-XL)   sacubitril-valsartan (ENTRESTO)   IF NEEDED acetaminophen (TYLENOL)  ipratropium (ATROVENT)  WHAT DO I DO ABOUT MY DIABETES MEDICATION?     Do not take oral diabetes medicines (pills) the morning of surgery.             empagliflozin (JARDIANCE) 2 LAST DOSE OCTOBER 20.   The day of surgery, do not take other diabetes injectables, including Byetta (exenatide), Bydureon (exenatide ER), Victoza (liraglutide), or Trulicity  (dulaglutide).   If your CBG is greater than 220 mg/dL, you may take  of your sliding scale (correction) dose of insulin.     HOW TO MANAGE YOUR DIABETES BEFORE AND AFTER SURGERY   Why is it important to control my blood sugar before and after surgery? Improving blood sugar levels before and after surgery helps healing and can limit problems. A way of improving blood sugar control is eating a healthy diet by:  Eating less sugar and carbohydrates  Increasing activity/exercise  Talking with your doctor about reaching your blood sugar goals High blood sugars (greater than 180 mg/dL) can raise your risk of infections and slow your recovery, so you will need to focus on controlling your diabetes during the weeks before surgery. Make sure that the doctor who takes care of your diabetes knows about your planned surgery including the date and location.   How do I manage my blood sugar before surgery? Check your blood sugar at least 4 times a day, starting 2 days before surgery,  to make sure that the level is not too high or low.   Check your blood sugar the morning of your surgery when you wake up and every 2 hours until you get to the Short Stay unit.   If your blood sugar is less than 70 mg/dL, you will need to treat for low blood sugar: Do not take insulin. Treat a low blood sugar (less than 70 mg/dL) with  cup of clear juice (cranberry or apple), 4 glucose tablets, OR glucose gel. Recheck blood sugar in 15 minutes after treatment (to make sure it is greater than 70 mg/dL). If your blood sugar is not greater than 70 mg/dL on recheck, call [phone] for further instructions. Report your blood sugar to the short stay nurse when you get to Short Stay.   If you are admitted to the hospital after surgery: Your blood sugar will be checked by the staff and you will probably be given insulin after surgery (instead of oral diabetes medicines) to make sure you have good blood sugar levels. The  goal for blood sugar control after surgery is 80-180 mg/dL.    As of today, STOP taking any Aspirin (unless otherwise instructed by your surgeon) Aleve, Naproxen, Ibuprofen, Motrin, Advil, Goody's, BC's, all herbal medications, fish oil, and all vitamins.                       Do not wear jewelry, make up, or nail polish            Do not wear lotions, powders, perfumes/colognes, or deodorant.            Do not shave 48 hours prior to surgery.  Men may shave face and neck.            Do not bring valuables to the hospital.            [facility] is not responsible for any belongings or valuables.   Do NOT Smoke (Tobacco/Vaping) 24 hours prior to your procedure If you use a CPAP at night, you may bring all equipment for your overnight stay.   Contacts, glasses, dentures or bridgework may not be worn into surgery.      For patients admitted to the hospital, discharge time will be determined by your treatment team.   Patients discharged the day of surgery will not be allowed to drive home, and someone needs to stay with them for 24 hours.       Special instructions:   - Preparing For Surgery   Before surgery, you can play an important role. Because skin is not sterile, your skin needs to be as free of germs as possible. You can reduce the number of germs on your skin by washing with CHG (chlorahexidine gluconate) Soap before surgery.  CHG is an antiseptic cleaner which kills germs and bonds with the skin to continue killing germs even after washing.     Oral Hygiene is also important to reduce your risk of infection.  Remember - BRUSH YOUR TEETH THE MORNING OF SURGERY WITH YOUR REGULAR TOOTHPASTE   Please do not use if you have an allergy to CHG or antibacterial soaps. If your skin becomes reddened/irritated stop using the CHG.  Do not shave (including legs and underarms) for at least 48 hours prior to first CHG shower. It is OK to shave your face.   Please follow these  instructions carefully.  Shower the NIGHT BEFORE SURGERY and the MORNING OF SURGERY with DIAL Soap.    Pat yourself dry with a CLEAN TOWEL.   Wear CLEAN PAJAMAS to bed the night before surgery   Place CLEAN SHEETS on your bed the night of your first shower and DO NOT SLEEP WITH PETS.     Day of Surgery: Please shower morning of surgery  Wear Clean/Comfortable clothing the morning of surgery Do not apply any deodorants/lotions.   Remember to brush your teeth WITH YOUR REGULAR TOOTHPASTE.   Questions were answered. Patient verbalized understanding of instructions.                        Electronically signed by Tillman Abide, RN at 03/22/2023  8:40 AM Electronically signed by Tillman Abide, RN at 03/22/2023  9:01 AM

## 2023-03-22 NOTE — Progress Notes (Addendum)
PCP -  Tenna Delaine, MD Cardiologist - Little Ishikawa, MD  Electrophysiologist Lanier Prude, MD   PPM/ICD -  Device Orders -  Rep Notified -   Chest x-ray - 06-27-21 EKG - 03-15-23 Stress Test -  ECHO - 02-20-23 Cardiac Cath -   CPAP -   DM- yes empagliflozin (JARDIANCE) LAST DOSE March 19, 2023 per instructions Blood Thinner Instructions: apixaban (ELIQUIS) Follow instructions provided by surgeon  Aspirin Instructions:   ERAS Protcol - NPO  COVID TEST- no  Anesthesia review: no  Patient verbally denies any shortness of breath, fever, cough and chest pain during phone call   -------------  SDW INSTRUCTIONS given:  Your procedure is scheduled on March 23, 2023.  Report to [facility] Main Entrance "A" at 11:00 A.M., and check in at the Admitting office.  Call this number if you have problems the morning of surgery:  [phone]   Remember:  Do not eat or drink after midnight the night before your surgery     Take these medicines the morning of surgery with A SIP OF WATER  amLODipine (NORVASC)  apixaban (ELIQUIS)  metoprolol succinate (TOPROL-XL)   sacubitril-valsartan (ENTRESTO)   IF NEEDED acetaminophen (TYLENOL)  ipratropium (ATROVENT)  WHAT DO I DO ABOUT MY DIABETES MEDICATION?   Do not take oral diabetes medicines (pills) the morning of surgery.  empagliflozin (JARDIANCE) 2 LAST DOSE OCTOBER 20.  The day of surgery, do not take other diabetes injectables, including Byetta (exenatide), Bydureon (exenatide ER), Victoza (liraglutide), or Trulicity (dulaglutide).  If your CBG is greater than 220 mg/dL, you may take  of your sliding scale (correction) dose of insulin.   HOW TO MANAGE YOUR DIABETES BEFORE AND AFTER SURGERY  Why is it important to control my blood sugar before and after surgery? Improving blood sugar levels before and after surgery helps healing and can limit problems. A way of improving blood sugar control is  eating a healthy diet by:  Eating less sugar and carbohydrates  Increasing activity/exercise  Talking with your doctor about reaching your blood sugar goals High blood sugars (greater than 180 mg/dL) can raise your risk of infections and slow your recovery, so you will need to focus on controlling your diabetes during the weeks before surgery. Make sure that the doctor who takes care of your diabetes knows about your planned surgery including the date and location.  How do I manage my blood sugar before surgery? Check your blood sugar at least 4 times a day, starting 2 days before surgery, to make sure that the level is not too high or low.  Check your blood sugar the morning of your surgery when you wake up and every 2 hours until you get to the Short Stay unit.  If your blood sugar is less than 70 mg/dL, you will need to treat for low blood sugar: Do not take insulin. Treat a low blood sugar (less than 70 mg/dL) with  cup of clear juice (cranberry or apple), 4 glucose tablets, OR glucose gel. Recheck blood sugar in 15 minutes after treatment (to make sure it is greater than 70 mg/dL). If your blood sugar is not greater than 70 mg/dL on recheck, call [phone] for further instructions. Report your blood sugar to the short stay nurse when you get to Short Stay.  If you are admitted to the hospital after surgery: Your blood sugar will be checked by the staff and you will probably be given insulin after surgery (instead  of oral diabetes medicines) to make sure you have good blood sugar levels. The goal for blood sugar control after surgery is 80-180 mg/dL.   As of today, STOP taking any Aspirin (unless otherwise instructed by your surgeon) Aleve, Naproxen, Ibuprofen, Motrin, Advil, Goody's, BC's, all herbal medications, fish oil, and all vitamins.                      Do not wear jewelry, make up, or nail polish            Do not wear lotions, powders, perfumes/colognes, or deodorant.             Do not shave 48 hours prior to surgery.  Men may shave face and neck.            Do not bring valuables to the hospital.            [facility] is not responsible for any belongings or valuables.  Do NOT Smoke (Tobacco/Vaping) 24 hours prior to your procedure If you use a CPAP at night, you may bring all equipment for your overnight stay.   Contacts, glasses, dentures or bridgework may not be worn into surgery.      For patients admitted to the hospital, discharge time will be determined by your treatment team.   Patients discharged the day of surgery will not be allowed to drive home, and someone needs to stay with them for 24 hours.    Special instructions:   [place]- Preparing For Surgery  Before surgery, you can play an important role. Because skin is not sterile, your skin needs to be as free of germs as possible. You can reduce the number of germs on your skin by washing with CHG (chlorahexidine gluconate) Soap before surgery.  CHG is an antiseptic cleaner which kills germs and bonds with the skin to continue killing germs even after washing.    Oral Hygiene is also important to reduce your risk of infection.  Remember - BRUSH YOUR TEETH THE MORNING OF SURGERY WITH YOUR REGULAR TOOTHPASTE  Please do not use if you have an allergy to CHG or antibacterial soaps. If your skin becomes reddened/irritated stop using the CHG.  Do not shave (including legs and underarms) for at least 48 hours prior to first CHG shower. It is OK to shave your face.  Please follow these instructions carefully.   Shower the NIGHT BEFORE SURGERY and the MORNING OF SURGERY with DIAL Soap.   Pat yourself dry with a CLEAN TOWEL.  Wear CLEAN PAJAMAS to bed the night before surgery  Place CLEAN SHEETS on your bed the night of your first shower and DO NOT SLEEP WITH PETS.   Day of Surgery: Please shower morning of surgery  Wear Clean/Comfortable clothing the morning of surgery Do not apply any  deodorants/lotions.   Remember to brush your teeth WITH YOUR REGULAR TOOTHPASTE.   Questions were answered. Patient verbalized understanding of instructions.

## 2023-03-22 NOTE — Telephone Encounter (Signed)
Called patient to confirm procedure instructions as Short Stay RN confirmed she did not speak with the patient.  The patient saw Dr. Lalla Brothers in clinic last week and instructions were reviewed.  Left message for the patient to call back if he has any questions or concerns prior to procedure.

## 2023-03-23 ENCOUNTER — Inpatient Hospital Stay (HOSPITAL_COMMUNITY)
Admission: RE | Admit: 2023-03-23 | Discharge: 2023-03-24 | DRG: 274 | Disposition: A | Discharge status: Home / Self Care | Payer: Medicare Other | Attending: Cardiology | Admitting: Cardiology

## 2023-03-23 ENCOUNTER — Other Ambulatory Visit: Payer: Self-pay

## 2023-03-23 ENCOUNTER — Encounter (HOSPITAL_COMMUNITY): Payer: Self-pay | Admitting: Cardiology

## 2023-03-23 ENCOUNTER — Encounter (HOSPITAL_COMMUNITY)
Admission: RE | Disposition: A | Discharge status: Home / Self Care | Payer: Self-pay | Source: Home / Self Care | Attending: Cardiology

## 2023-03-23 ENCOUNTER — Inpatient Hospital Stay (HOSPITAL_COMMUNITY): Payer: Medicare Other

## 2023-03-23 ENCOUNTER — Inpatient Hospital Stay (HOSPITAL_COMMUNITY): Payer: Medicare Other | Admitting: Anesthesiology

## 2023-03-23 DIAGNOSIS — Z79899 Other long term (current) drug therapy: Secondary | ICD-10-CM | POA: Diagnosis not present

## 2023-03-23 DIAGNOSIS — I4819 Other persistent atrial fibrillation: Secondary | ICD-10-CM

## 2023-03-23 DIAGNOSIS — Z006 Encounter for examination for normal comparison and control in clinical research program: Secondary | ICD-10-CM

## 2023-03-23 DIAGNOSIS — Z888 Allergy status to other drugs, medicaments and biological substances status: Secondary | ICD-10-CM | POA: Diagnosis not present

## 2023-03-23 DIAGNOSIS — I110 Hypertensive heart disease with heart failure: Secondary | ICD-10-CM | POA: Diagnosis present

## 2023-03-23 DIAGNOSIS — E7439 Other disorders of intestinal carbohydrate absorption: Secondary | ICD-10-CM | POA: Diagnosis present

## 2023-03-23 DIAGNOSIS — G4733 Obstructive sleep apnea (adult) (pediatric): Secondary | ICD-10-CM | POA: Diagnosis not present

## 2023-03-23 DIAGNOSIS — I700 Atherosclerosis of aorta: Secondary | ICD-10-CM | POA: Diagnosis present

## 2023-03-23 DIAGNOSIS — I4891 Unspecified atrial fibrillation: Secondary | ICD-10-CM

## 2023-03-23 DIAGNOSIS — Z7984 Long term (current) use of oral hypoglycemic drugs: Secondary | ICD-10-CM | POA: Diagnosis not present

## 2023-03-23 DIAGNOSIS — E119 Type 2 diabetes mellitus without complications: Secondary | ICD-10-CM

## 2023-03-23 DIAGNOSIS — I493 Ventricular premature depolarization: Secondary | ICD-10-CM | POA: Diagnosis present

## 2023-03-23 DIAGNOSIS — I2510 Atherosclerotic heart disease of native coronary artery without angina pectoris: Secondary | ICD-10-CM | POA: Diagnosis present

## 2023-03-23 DIAGNOSIS — Z7901 Long term (current) use of anticoagulants: Secondary | ICD-10-CM

## 2023-03-23 DIAGNOSIS — I517 Cardiomegaly: Secondary | ICD-10-CM | POA: Diagnosis not present

## 2023-03-23 DIAGNOSIS — I10 Essential (primary) hypertension: Secondary | ICD-10-CM | POA: Diagnosis present

## 2023-03-23 DIAGNOSIS — E785 Hyperlipidemia, unspecified: Secondary | ICD-10-CM | POA: Diagnosis present

## 2023-03-23 DIAGNOSIS — I5022 Chronic systolic (congestive) heart failure: Secondary | ICD-10-CM | POA: Diagnosis not present

## 2023-03-23 DIAGNOSIS — Z95818 Presence of other cardiac implants and grafts: Principal | ICD-10-CM

## 2023-03-23 DIAGNOSIS — Z01818 Encounter for other preprocedural examination: Secondary | ICD-10-CM | POA: Diagnosis not present

## 2023-03-23 HISTORY — PX: LEFT ATRIAL APPENDAGE OCCLUSION: EP1229

## 2023-03-23 HISTORY — PX: TEE WITHOUT CARDIOVERSION: SHX5443

## 2023-03-23 LAB — POCT ACTIVATED CLOTTING TIME: Activated Clotting Time: 385 s

## 2023-03-23 LAB — TYPE AND SCREEN
ABO/RH(D): A POS
Antibody Screen: NEGATIVE

## 2023-03-23 LAB — GLUCOSE, CAPILLARY: Glucose-Capillary: 105 mg/dL — ABNORMAL HIGH (ref 70–99)

## 2023-03-23 LAB — ECHO TEE: Est EF: 55

## 2023-03-23 SURGERY — LEFT ATRIAL APPENDAGE OCCLUSION
Anesthesia: General

## 2023-03-23 MED ORDER — SODIUM CHLORIDE 0.9% FLUSH: 3.0000 mL | INTRAVENOUS | Status: DC | PRN

## 2023-03-23 MED ORDER — PROPOFOL 10 MG/ML IV BOLUS
INTRAVENOUS | Status: DC | PRN
  Administered 2023-03-23: 20 mg via INTRAVENOUS
  Administered 2023-03-23: 140 mg via INTRAVENOUS

## 2023-03-23 MED ORDER — ATORVASTATIN CALCIUM 40 MG PO TABS
40.0000 mg | ORAL_TABLET | Freq: Every day | ORAL | Status: DC
  Administered 2023-03-23 – 2023-03-24 (×2): 40 mg via ORAL
  Filled 2023-03-23 (×2): qty 1

## 2023-03-23 MED ORDER — CHLORHEXIDINE GLUCONATE 0.12 % MT SOLN
15.0000 mL | Freq: Once | OROMUCOSAL | Status: AC
  Filled 2023-03-23: qty 15

## 2023-03-23 MED ORDER — HEPARIN (PORCINE) IN NACL 2000-0.9 UNIT/L-% IV SOLN
INTRAVENOUS | Status: DC | PRN
  Administered 2023-03-23: 1000 mL

## 2023-03-23 MED ORDER — FENTANYL CITRATE (PF) 250 MCG/5ML IJ SOLN
INTRAMUSCULAR | Status: DC | PRN
  Administered 2023-03-23: 100 ug via INTRAVENOUS

## 2023-03-23 MED ORDER — ONDANSETRON HCL 4 MG/2ML IJ SOLN
INTRAMUSCULAR | Status: DC | PRN
  Administered 2023-03-23: 4 mg via INTRAVENOUS

## 2023-03-23 MED ORDER — SODIUM CHLORIDE 0.9 % IV SOLN: INTRAVENOUS | Status: DC

## 2023-03-23 MED ORDER — CHLORHEXIDINE GLUCONATE 0.12 % MT SOLN
OROMUCOSAL | Status: AC
  Administered 2023-03-23: 15 mL via OROMUCOSAL
  Filled 2023-03-23: qty 15

## 2023-03-23 MED ORDER — ACETAMINOPHEN 325 MG PO TABS: 650.0000 mg | ORAL_TABLET | ORAL | Status: DC | PRN

## 2023-03-23 MED ORDER — IOHEXOL 350 MG/ML SOLN
INTRAVENOUS | Status: DC | PRN
  Administered 2023-03-23 (×3): 10 mL

## 2023-03-23 MED ORDER — HEPARIN SODIUM (PORCINE) 1000 UNIT/ML IJ SOLN
INTRAMUSCULAR | Status: DC | PRN
  Administered 2023-03-23: 15000 [IU] via INTRAVENOUS

## 2023-03-23 MED ORDER — PROTAMINE SULFATE 10 MG/ML IV SOLN
INTRAVENOUS | Status: DC | PRN
  Administered 2023-03-23: 30 mg via INTRAVENOUS

## 2023-03-23 MED ORDER — SACUBITRIL-VALSARTAN 24-26 MG PO TABS
1.0000 | ORAL_TABLET | Freq: Two times a day (BID) | ORAL | Status: DC
  Administered 2023-03-23 – 2023-03-24 (×2): 1 via ORAL
  Filled 2023-03-23 (×2): qty 1

## 2023-03-23 MED ORDER — METOPROLOL SUCCINATE ER 50 MG PO TB24
50.0000 mg | ORAL_TABLET | Freq: Every day | ORAL | Status: DC
  Administered 2023-03-23 – 2023-03-24 (×2): 50 mg via ORAL
  Filled 2023-03-23 (×2): qty 1

## 2023-03-23 MED ORDER — SUGAMMADEX SODIUM 200 MG/2ML IV SOLN
INTRAVENOUS | Status: DC | PRN
  Administered 2023-03-23: 200 mg via INTRAVENOUS

## 2023-03-23 MED ORDER — CHLORHEXIDINE GLUCONATE 4 % EX SOLN
Freq: Once | CUTANEOUS | Status: DC
  Filled 2023-03-23: qty 15

## 2023-03-23 MED ORDER — ROCURONIUM BROMIDE 10 MG/ML (PF) SYRINGE
PREFILLED_SYRINGE | INTRAVENOUS | Status: DC | PRN
  Administered 2023-03-23: 70 mg via INTRAVENOUS

## 2023-03-23 MED ORDER — SODIUM CHLORIDE 0.9% FLUSH
10.0000 mL | Freq: Two times a day (BID) | INTRAVENOUS | Status: DC
  Administered 2023-03-23 – 2023-03-24 (×2): 10 mL via INTRAVENOUS

## 2023-03-23 MED ORDER — AMLODIPINE BESYLATE 5 MG PO TABS
5.0000 mg | ORAL_TABLET | Freq: Every day | ORAL | Status: DC
  Administered 2023-03-24: 5 mg via ORAL
  Filled 2023-03-23: qty 1

## 2023-03-23 MED ORDER — SODIUM CHLORIDE 0.9% FLUSH
3.0000 mL | Freq: Two times a day (BID) | INTRAVENOUS | Status: DC
  Administered 2023-03-24: 3 mL via INTRAVENOUS

## 2023-03-23 MED ORDER — CEFAZOLIN SODIUM-DEXTROSE 2-4 GM/100ML-% IV SOLN
2.0000 g | INTRAVENOUS | Status: AC
  Administered 2023-03-23: 2 g via INTRAVENOUS
  Filled 2023-03-23: qty 100

## 2023-03-23 MED ORDER — PHENYLEPHRINE 80 MCG/ML (10ML) SYRINGE FOR IV PUSH (FOR BLOOD PRESSURE SUPPORT)
PREFILLED_SYRINGE | INTRAVENOUS | Status: DC | PRN
  Administered 2023-03-23: 80 ug via INTRAVENOUS

## 2023-03-23 MED ORDER — APIXABAN 5 MG PO TABS
5.0000 mg | ORAL_TABLET | Freq: Two times a day (BID) | ORAL | Status: DC
  Administered 2023-03-23 – 2023-03-24 (×2): 5 mg via ORAL
  Filled 2023-03-23 (×2): qty 1

## 2023-03-23 MED ORDER — SODIUM CHLORIDE 0.9% FLUSH
3.0000 mL | Freq: Two times a day (BID) | INTRAVENOUS | Status: DC
  Administered 2023-03-23 – 2023-03-24 (×2): 3 mL via INTRAVENOUS

## 2023-03-23 MED ORDER — LIDOCAINE 2% (20 MG/ML) 5 ML SYRINGE
INTRAMUSCULAR | Status: DC | PRN
  Administered 2023-03-23: 60 mg via INTRAVENOUS

## 2023-03-23 SURGICAL SUPPLY — 19 items
BLANKET WARM UNDERBOD FULL ACC (MISCELLANEOUS) ×1
CATH DIAG 6FR PIGTAIL ANGLED (CATHETERS) ×1
CLOSURE PERCLOSE PROSTYLE (VASCULAR PRODUCTS) ×2
DILATOR VESSEL 38 20CM 12FR (INTRODUCER) ×1
INQWIRE 1.5J .035X260CM (WIRE) ×1
KIT HEART LEFT (KITS) ×1
KIT SHEA VERSACROSS LAAC CONNE (KITS) ×1
PACK CARDIAC CATHETERIZATION (CUSTOM PROCEDURE TRAY) ×1
PAD DEFIB RADIO PHYSIO CONN (PAD) ×1
SHEATH PERFORMER 16FR 30 (SHEATH) ×1
SHEATH PINNACLE 8F 10CM (SHEATH) ×1
SHEATH PROBE COVER 6X72 (BAG) ×1
SHIELD RADPAD SCOOP 12X17 (MISCELLANEOUS) ×1
SYS WATCHMAN FXD DBL (SHEATH) ×1
TRANSDUCER W/STOPCOCK (MISCELLANEOUS) ×1
TUBING CIL FLEX 10 FLL-RA (TUBING) ×1
WATCHMAN FLX PRO 24 (Prosthesis & Implant Heart) ×1 IMPLANT
WATCHMAN FLX PRO PROCEDURE (KITS) ×1
WATCHMAN FXD CRV SYS PROCEDURE (KITS) ×1

## 2023-03-23 NOTE — Discharge Summary (Signed)
[facility]    Patient ID: Jeffrey Andrews,  MRN: [id number], DOB/AGE: 1945-07-31 77 y.o.  Admit date: 03/23/2023 Discharge date: 03/24/2023  Primary Care Physician: Tenna Delaine, MD  Primary Cardiologist: Little Ishikawa, MD  Electrophysiologist: Lanier Prude, MD  Primary Discharge Diagnosis:  Persistent Atrial Fibrillation Poor candidacy for long term anticoagulation due to frequent nosebleeds  Procedures This Admission:  Transeptal Puncture Intra-procedural TEE which showed no LAA thrombus Left atrial appendage occlusive device placement on 03/23/23 by Dr. Lalla Brothers.   This study demonstrated:  CONCLUSIONS:  1.Successful implantation of a WATCHMAN left atrial appendage occlusive device    2. TEE demonstrating no LAA thrombus 3. No early apparent complications.    Post Implant Anticoagulation Strategy: Continue Eliquis 5mg  by mouth twice daily for 45 days. After 45 days, stop Eliquis and start Plavix 75mg  by mouth once daily to complete 6 months of total post implant medical therapy. Plan for CT scan 60 days after implant to assess appendage patency and Watchman position.    Brief HPI: Jeffrey Andrews is a 77 y.o. male with a history of OSA, HTN, chronic systolic CHF, CAD by CT, HLD, and persistent atrial fibrillation s/p ablation 11/2022.   Jeffrey Andrews is followed by Dr. Bjorn Pippin for his cardiology care. He was initially found to have AF after a hospital admission 05/2021. He underwent successful TEE/DCCV however had quick recurrence. Echocardiogram from that time showed a reduced EF at 35-40% which has since recovered on serial imaging. 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  He ultimately underwent successful AF ablation 7/12 with Dr. Lalla Brothers with plan to pursue subsequent LAAO.   Pre ablation CT performed which showed anatomy suitable for implantation which was scheduled for 03/23/23.   Hospital Course:  The  patient was admitted and underwent left atrial appendage occlusive device placement with 24mm Watchman FLX Pro device on 03/23/23. Due to timing of his procedure, he was kept overnight and has done very well.  Groin site has been stable without evidence of hematoma or bleeding. Wound care and restrictions were reviewed with the patient. The patient has been scheduled for post procedure follow up with Georgie Chard, NP in approximately 1 month. He was restarted on Eliquis yesterday evening and will continue for 45 days (12/1) at which time he will stop and transition to Plavix 75mg  daily to complete 6 months of therapy. He will also require dental SBE during this time and should refrain from dental cleanings for at least 30 days. SBE will be further discussed and RXd at follow up.  A repeat CT will be performed in approximately 60 days to ensure proper seal of the device.   Physical Exam: Vitals:   03/23/23 2000 03/23/23 2341 03/24/23 0500 03/24/23 0749  BP: (!) 140/70 128/67 (!) 129/56 132/61  Pulse:  61 60   Resp: 18 16 16    Temp: 98.5 F (36.9 C) 98.6 F (37 C) 98.7 F (37.1 C) 99.2 F (37.3 C)  TempSrc:  Oral Oral Oral  SpO2: 96% 94% 96%   Weight:      Height:       General: Well developed, well nourished, NAD Lungs:Clear to ausculation bilaterally. No wheezes, rales, or rhonchi. Breathing is unlabored. Cardiovascular: RRR with S1 S2. No murmurs Extremities: No edema. Groin stable.  Neuro: Alert and oriented. No focal deficits. No facial asymmetry. MAE spontaneously. Psych: Responds to questions appropriately with normal affect.  Labs:   Lab Results  Component Value Date   WBC 6.5 03/16/2023   HGB 15.1 03/16/2023   HCT 46.0 03/16/2023   MCV 102 (H) 03/16/2023   PLT 213 03/16/2023    Recent Labs  Lab 03/24/23 0218  NA 136  K 3.6  CL 104  CO2 24  BUN 9  CREATININE 0.75  CALCIUM 8.7*  GLUCOSE 134*   Discharge Medications:  Allergies as of 03/24/2023       Reactions    Fluticasone    Bleeding from nose        Medication List     TAKE these medications    acetaminophen 650 MG CR tablet Commonly known as: TYLENOL Take 650 mg by mouth 2 (two) times daily as needed for pain.   amLODipine 5 MG tablet Commonly known as: NORVASC Take 5 mg by mouth daily.   apixaban 5 MG Tabs tablet Commonly known as: ELIQUIS Take 1 tablet (5 mg total) by mouth 2 (two) times daily.   atorvastatin 40 MG tablet Commonly known as: LIPITOR Take 40 mg by mouth daily.   Cholecalciferol 25 MCG (1000 UT) Tbdp Take 1,000 Units by mouth daily.   empagliflozin 25 MG Tabs tablet Commonly known as: JARDIANCE Take 12.5 mg by mouth daily.   ipratropium 0.03 % nasal spray Commonly known as: ATROVENT Place 2 sprays into both nostrils 2 (two) times daily.   metoprolol succinate 50 MG 24 hr tablet Commonly known as: TOPROL-XL TAKE 1 TABLET BY MOUTH DAILY. TAKE WITH OR IMMEDIATELY FOLLOWING A MEAL.   Multi-Vitamin tablet Take 1 tablet by mouth daily.   PreserVision AREDS 2 Caps Take 1 capsule by mouth 2 (two) times daily.   sacubitril-valsartan 24-26 MG Commonly known as: ENTRESTO Take 1 tablet by mouth 2 (two) times daily.        Disposition:  Home  Discharge Instructions     Call MD for:  redness, tenderness, or signs of infection (pain, swelling, redness, odor or green/yellow discharge around incision site)   Complete by: As directed    Call MD for:  severe uncontrolled pain   Complete by: As directed    Call MD for:  temperature >100.4   Complete by: As directed    Diet - low sodium heart healthy   Complete by: As directed    Discharge instructions   Complete by: As directed    [facility] Procedure, Care After  Procedure MD: Dr. Isidoro Donning Clinical Coordinator: Karsten Fells, RN  This sheet gives you information about how to care for yourself after your procedure. Your health care provider may also give you more specific instructions. If you  have problems or questions, contact your health care provider.  What can I expect after the procedure? After the procedure, it is common to have: Bruising around your puncture site. Tenderness around your puncture site. Tiredness (fatigue).  Medication instructions It is very important to continue to take your blood thinner as directed by your doctor after the Watchman procedure. Call your procedure doctor's office with question or concerns. If you are on Coumadin (warfarin), you will have your INR checked the week after your procedure, with a goal INR of 2.0 - 3.0. Please follow your medication instructions on your discharge summary. Only take the medications listed on your discharge paperwork.  Follow up You will be seen in 1 month after your procedure You will have a repeat CT scan approximately 8 weeks after your procedure mark to check your  device You will follow up the MD/APP who performed your procedure 6 months after your procedure The Watchman Clinical Coordinator will check in with you from time to time, including 1 and 2 years after your procedure.    Follow these instructions at home: Puncture site care  Follow instructions from your health care provider about how to take care of your puncture site. Make sure you: If present, leave stitches (sutures), skin glue, or adhesive strips in place.  If a large square bandage is present, this may be removed 24 hours after surgery.  Check your puncture site every day for signs of infection. Check for: Redness, swelling, or pain. Fluid or blood. If your puncture site starts to bleed, lie down on your back, apply firm pressure to the area, and contact your health care provider. Warmth. Pus or a bad smell. Driving Do not drive yourself home if you received sedation Do not drive for at least 4 days after your procedure or however long your health care provider recommends. (Do not resume driving if you have previously been instructed not  to drive for other health reasons.) Do not spend greater than 1 hour at a time in a car for the first 3 days. Stop and take a break with a 5 minute walk at least every hour.  Do not drive or use heavy machinery while taking prescription pain medicine.  Activity Avoid activities that take a lot of effort, including exercise, for at least 7 days after your procedure. For the first 3 days, avoid sitting for longer than one hour at a time.  Avoid alcoholic beverages, signing paperwork, or participating in legal proceedings for 24 hours after receiving sedation Do not lift anything that is heavier than 10 lb (4.5 kg) for one week.  No sexual activity for 1 week.  Return to your normal activities as told by your health care provider. Ask your health care provider what activities are safe for you. General instructions Take over-the-counter and prescription medicines only as told by your health care provider. Do not use any products that contain nicotine or tobacco, such as cigarettes and e-cigarettes. If you need help quitting, ask your health care provider. You may shower after 24 hours, but Do not take baths, swim, or use a hot tub for 1 week.  Do not drink alcohol for 24 hours after your procedure. Keep all follow-up visits as told by your health care provider. This is important. Dental Work: You will require antibiotics prior to any dental work, including cleanings, for 6 months after your Watchman implantation to help protect you from infection. After 6 months, antibiotics are no longer required. Contact a health care provider if: You have redness, mild swelling, or pain around your puncture site. You have soreness in your throat or at your puncture site that does not improve after several days You have fluid or blood coming from your puncture site that stops after applying firm pressure to the area. Your puncture site feels warm to the touch. You have pus or a bad smell coming from your  puncture site. You have a fever. You have chest pain or discomfort that spreads to your neck, jaw, or arm. You are sweating a lot. You feel nauseous. You have a fast or irregular heartbeat. You have shortness of breath. You are dizzy or light-headed and feel the need to lie down. You have pain or numbness in the arm or leg closest to your puncture site. Get help right away if:  Your puncture site suddenly swells. Your puncture site is bleeding and the bleeding does not stop after applying firm pressure to the area. These symptoms may represent a serious problem that is an emergency. Do not wait to see if the symptoms will go away. Get medical help right away. Call your local emergency services (911 in the [place] Do not drive yourself to the hospital. Summary After the procedure, it is normal to have bruising and tenderness at the puncture site in your groin, neck, or forearm. Check your puncture site every day for signs of infection. Get help right away if your puncture site is bleeding and the bleeding does not stop after applying firm pressure to the area. This is a medical emergency.  This information is not intended to replace advice given to you by your health care provider. Make sure you discuss any questions you have with your health care provider.   Increase activity slowly   Complete by: As directed        Follow-up Information     Filbert Schilder, NP Follow up on 05/03/2023.   Specialty: Cardiology Why: Your appointment is at 9:30AM. Plan to arrive by 9:15AM. Contact information: [address] [place] [address] [phone]                 Duration of Discharge Encounter: Greater than 30 minutes including physician time.  Signed, Georgie Chard, NP  03/24/2023 10:22 AM

## 2023-03-23 NOTE — Interval H&P Note (Signed)
History and Physical Interval Note:  03/23/2023 1:12 PM  Jeffrey Andrews  has presented today for surgery, with the diagnosis of afib.  The various methods of treatment have been discussed with the patient and family. After consideration of risks, benefits and other options for treatment, the patient has consented to  Procedure(s): LEFT ATRIAL APPENDAGE OCCLUSION (N/A) TRANSESOPHAGEAL ECHOCARDIOGRAM (N/A) as a surgical intervention.  The patient's history has been reviewed, patient examined, no change in status, stable for surgery.  I have reviewed the patient's chart and labs.  Questions were answered to the patient's satisfaction.     Kentley Cedillo T Janece Laidlaw

## 2023-03-23 NOTE — Anesthesia Preprocedure Evaluation (Signed)
Anesthesia Evaluation  Patient identified by MRN, date of birth, ID band Patient awake    Reviewed: Allergy & Precautions, NPO status , Patient's Chart, lab work & pertinent test results  History of Anesthesia Complications Negative for: history of anesthetic complications  Airway Mallampati: III  TM Distance: >3 FB Neck ROM: Full    Dental  (+) Teeth Intact, Dental Advisory Given,    Pulmonary neg shortness of breath, sleep apnea , neg COPD, neg recent URI   breath sounds clear to auscultation       Cardiovascular hypertension, Pt. on medications and Pt. on home beta blockers + dysrhythmias Atrial Fibrillation  Rhythm:Regular   1. Left ventricular ejection fraction, by estimation, is 55 to 60%. The  left ventricle has normal function. The left ventricle has no regional  wall motion abnormalities. Left ventricular diastolic parameters are  consistent with Grade I diastolic  dysfunction (impaired relaxation).   2. Right ventricular systolic function is normal. The right ventricular  size is normal.   3. The mitral valve is normal in structure. Mild mitral valve  regurgitation. No evidence of mitral stenosis.   4. The aortic valve is tricuspid. Aortic valve regurgitation is not  visualized. No aortic stenosis is present.   5. Aortic dilatation noted. There is borderline dilatation of the aortic  root, measuring 39 mm. There is mild dilatation of the ascending aorta,  measuring 41 mm.   6. The inferior vena cava is normal in size with greater than 50%  respiratory variability, suggesting right atrial pressure of 3 mmHg.     Neuro/Psych negative neurological ROS  negative psych ROS   GI/Hepatic negative GI ROS, Neg liver ROS,,,  Endo/Other  diabetes    Renal/GU Lab Results      Component                Value               Date                      NA                       141                 03/16/2023                K                         4.0                 03/16/2023                CO2                      24                  03/16/2023                GLUCOSE                  147 (H)             03/16/2023                BUN                      13  03/16/2023                CREATININE               0.73 (L)            03/16/2023                CALCIUM                  9.0                 03/16/2023                EGFR                     94                  03/16/2023                GFRNONAA                 >60                 07/20/2021                Musculoskeletal  (+) Arthritis ,    Abdominal   Peds  Hematology  (+) Blood dyscrasia Lab Results      Component                Value               Date                      WBC                      6.5                 03/16/2023                HGB                      15.1                03/16/2023                HCT                      46.0                03/16/2023                MCV                      102 (H)             03/16/2023                PLT                      213                 03/16/2023             eliquis   Anesthesia Other Findings   Reproductive/Obstetrics                              Anesthesia  Physical Anesthesia Plan  ASA: 2  Anesthesia Plan: General   Post-op Pain Management: Minimal or no pain anticipated   Induction: Intravenous  PONV Risk Score and Plan: 2 and Ondansetron and Dexamethasone  Airway Management Planned: Oral ETT  Additional Equipment: None  Intra-op Plan:   Post-operative Plan: Extubation in OR  Informed Consent: I have reviewed the patients History and Physical, chart, labs and discussed the procedure including the risks, benefits and alternatives for the proposed anesthesia with the patient or authorized representative who has indicated his/her understanding and acceptance.     Dental advisory given  Plan Discussed with:  CRNA  Anesthesia Plan Comments:          Anesthesia Quick Evaluation

## 2023-03-23 NOTE — Anesthesia Procedure Notes (Signed)
Procedure Name: Intubation Date/Time: 03/23/2023 2:09 PM  Performed by: Gus Puma, CRNAPre-anesthesia Checklist: Patient identified, Emergency Drugs available, Suction available and Patient being monitored Patient Re-evaluated:Patient Re-evaluated prior to induction Oxygen Delivery Method: Circle System Utilized Preoxygenation: Pre-oxygenation with 100% oxygen Induction Type: IV induction Ventilation: Mask ventilation without difficulty Laryngoscope Size: Mac and 4 Grade View: Grade II Tube type: Oral Tube size: 7.5 mm Number of attempts: 1 Airway Equipment and Method: Stylet and Oral airway Placement Confirmation: ETT inserted through vocal cords under direct vision, positive ETCO2 and breath sounds checked- equal and bilateral Secured at: 22 cm Tube secured with: Tape Dental Injury: Teeth and Oropharynx as per pre-operative assessment

## 2023-03-23 NOTE — Transfer of Care (Signed)
Immediate Anesthesia Transfer of Care Note  Patient: Jeffrey Andrews  Procedure(s) Performed: LEFT ATRIAL APPENDAGE OCCLUSION TRANSESOPHAGEAL ECHOCARDIOGRAM  Patient Location: Cath Lab  Anesthesia Type:General  Level of Consciousness: drowsy and patient cooperative  Airway & Oxygen Therapy: Patient Spontanous Breathing and Patient connected to nasal cannula oxygen  Post-op Assessment: Report given to RN and Post -op Vital signs reviewed and stable  Post vital signs: Reviewed and stable  Last Vitals:  Vitals Value Taken Time  BP 159/70 03/23/23 1509  Temp 36.9 C 03/23/23 1509  Pulse 68 03/23/23 1514  Resp 12 03/23/23 1514  SpO2 93 % 03/23/23 1514  Vitals shown include unfiled device data.  Last Pain:  Vitals:   03/23/23 1509  TempSrc: Temporal  PainSc:          Complications: No notable events documented.

## 2023-03-24 ENCOUNTER — Encounter (HOSPITAL_COMMUNITY): Payer: Self-pay | Admitting: Cardiology

## 2023-03-24 DIAGNOSIS — I4819 Other persistent atrial fibrillation: Secondary | ICD-10-CM | POA: Diagnosis not present

## 2023-03-24 DIAGNOSIS — I110 Hypertensive heart disease with heart failure: Secondary | ICD-10-CM | POA: Diagnosis not present

## 2023-03-24 DIAGNOSIS — Z006 Encounter for examination for normal comparison and control in clinical research program: Secondary | ICD-10-CM | POA: Diagnosis not present

## 2023-03-24 DIAGNOSIS — I5022 Chronic systolic (congestive) heart failure: Secondary | ICD-10-CM | POA: Diagnosis not present

## 2023-03-24 LAB — BASIC METABOLIC PANEL
Anion gap: 8 (ref 5–15)
BUN: 9 mg/dL (ref 8–23)
CO2: 24 mmol/L (ref 22–32)
Calcium: 8.7 mg/dL — ABNORMAL LOW (ref 8.9–10.3)
Chloride: 104 mmol/L (ref 98–111)
Creatinine, Ser: 0.75 mg/dL (ref 0.61–1.24)
GFR, Estimated: >60 mL/min (ref >60)
Glucose, Bld: 134 mg/dL — ABNORMAL HIGH (ref 70–99)
Potassium: 3.6 mmol/L (ref 3.5–5.1)
Sodium: 136 mmol/L (ref 135–145)

## 2023-03-24 NOTE — Plan of Care (Signed)
  Problem: Education: Goal: Knowledge of cardiac device and self-care will improve Outcome: Adequate for Discharge Goal: Ability to safely manage health related needs after discharge will improve Outcome: Adequate for Discharge Goal: Individualized Educational Video(s) Outcome: Adequate for Discharge   Problem: Cardiac: Goal: Ability to achieve and maintain adequate cardiopulmonary perfusion will improve Outcome: Adequate for Discharge   Problem: Education: Goal: Knowledge of General Education information will improve Description: Including pain rating scale, medication(s)/side effects and non-pharmacologic comfort measures Outcome: Adequate for Discharge   Problem: Health Behavior/Discharge Planning: Goal: Ability to manage health-related needs will improve Outcome: Adequate for Discharge   Problem: Clinical Measurements: Goal: Ability to maintain clinical measurements within normal limits will improve Outcome: Adequate for Discharge Goal: Will remain free from infection Outcome: Adequate for Discharge Goal: Diagnostic test results will improve Outcome: Adequate for Discharge Goal: Respiratory complications will improve Outcome: Adequate for Discharge Goal: Cardiovascular complication will be avoided Outcome: Adequate for Discharge   Problem: Activity: Goal: Risk for activity intolerance will decrease Outcome: Adequate for Discharge   Problem: Nutrition: Goal: Adequate nutrition will be maintained Outcome: Adequate for Discharge   Problem: Coping: Goal: Level of anxiety will decrease Outcome: Adequate for Discharge   Problem: Elimination: Goal: Will not experience complications related to bowel motility Outcome: Adequate for Discharge Goal: Will not experience complications related to urinary retention Outcome: Adequate for Discharge   Problem: Pain Management: Goal: General experience of comfort will improve Outcome: Adequate for Discharge   Problem:  Safety: Goal: Ability to remain free from injury will improve Outcome: Adequate for Discharge   Problem: Skin Integrity: Goal: Risk for impaired skin integrity will decrease Outcome: Adequate for Discharge

## 2023-03-26 NOTE — Anesthesia Postprocedure Evaluation (Signed)
Anesthesia Post Note  Patient: Jeffrey Andrews  Procedure(s) Performed: LEFT ATRIAL APPENDAGE OCCLUSION TRANSESOPHAGEAL ECHOCARDIOGRAM     Patient location during evaluation: Cath Lab Anesthesia Type: General Level of consciousness: awake and alert Pain management: pain level controlled Vital Signs Assessment: post-procedure vital signs reviewed and stable Respiratory status: spontaneous breathing, nonlabored ventilation and respiratory function stable Cardiovascular status: blood pressure returned to baseline and stable Postop Assessment: no apparent nausea or vomiting Anesthetic complications: no   No notable events documented.                Unika Nazareno

## 2023-03-27 ENCOUNTER — Telehealth: Payer: Self-pay

## 2023-03-27 NOTE — Telephone Encounter (Signed)
  [facility]   Watch[facility] Team  Contacted the patient regarding discharge from [facility] on 03/24/2023  The patient understands to follow up with Georgie Chard on 05/03/2023 in preparation for CT on 05/25/2023.  The patient understands discharge instructions? Yes  The patient understands medications and regimen? Yes   The patient reports groin site looks great with no S/S of bleeding or infection  The patient understands to call with any questions or concerns prior to scheduled visit.

## 2023-04-24 ENCOUNTER — Encounter: Payer: Self-pay | Admitting: Cardiology

## 2023-05-02 NOTE — Progress Notes (Unsigned)
[facility]                                     Cardiology Office Note:    Date:  05/03/2023   ID:  Jeffrey Andrews, DOB Sep 24, 1945, MRN [id number]  PCP:  Tenna Delaine, MD  [facility] Cardiologist:  Little Ishikawa, MD  [facility] Electrophysiologist:  Lanier Prude, MD   Referring MD: Tenna Delaine, MD   Chief Complaint  Patient presents with   1 month s/p LAAO    History of Present Illness:    Jeffrey Andrews is a 77 y.o. male with a hx of OSA, HTN, chronic systolic CHF, CAD by CT, HLD, and persistent atrial fibrillation s/p ablation and LAAO with Watchman who is here for one month post implant follow up.    [name] is followed by Dr. Bjorn Pippin for his cardiology care. He was initially found to have AF after a hospital admission 05/2021. He underwent successful TEE/DCCV however had quick recurrence. Echocardiogram from that time showed a reduced EF at 35-40% which has since recovered on serial imaging. 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  He ultimately underwent successful AF ablation 7/12 with Dr. Lalla Brothers and is now s/p LAAO with Watchman 03/23/23 with 24mm Watchman FLX Pro device. He was restarted on Eliquis the evening of his procedure with plans to continue for 45 days (12/1) at which time he will stop and transition to Plavix 75mg  daily to complete 6 months of therapy (09/22/2023). He will also require dental SBE during this time and should refrain from dental cleanings for at least 30 days. A repeat CT will be performed in approximately 60 days to ensure proper seal of the device.   Today he is here alone and reports he has been very well since his procedure. He denies chest pain, SOB, palpitations, LE edema, orthopnea, PND, dizziness, or syncope. Denies bleeding in stool or urine.    Past Medical History:  Diagnosis Date   Arthritis    Cancer (HCC) 2014   skin cancer left arm   Excessive sweating     Glucose intolerance 06/27/2021   Heart murmur    "functional heart murmur"   History of kidney stones    History of skin cancer    Hypertension    Presence of Watchman left atrial appendage closure device 03/23/2023   24mm Watchman   Sleep apnea    uses c-pap    Past Surgical History:  Procedure Laterality Date   ATRIAL FIBRILLATION ABLATION N/A 12/09/2022   Procedure: ATRIAL FIBRILLATION ABLATION;  Surgeon: Lanier Prude, MD;  Location: [facility] INVASIVE CV LAB;  Service: Cardiovascular;  Laterality: N/A;   BACK SURGERY     x3 last back surgery 1984   CARDIOVERSION N/A 07/01/2021   Procedure: CARDIOVERSION;  Surgeon: Chilton Si, MD;  Location: [facility] ENDOSCOPY;  Service: Cardiovascular;  Laterality: N/A;   EYE SURGERY     cataracts with lens implants   LEFT ATRIAL APPENDAGE OCCLUSION N/A 03/23/2023   Procedure: LEFT ATRIAL APPENDAGE OCCLUSION;  Surgeon: Lanier Prude, MD;  Location: [facility] INVASIVE CV LAB;  Service: Cardiovascular;  Laterality: N/A;   STERIOD INJECTION Right 06/08/2015   Procedure: STEROID INJECTION;  Surgeon: Ollen Gross, MD;  Location: [facility] ORS;  Service: Orthopedics;  Laterality: Right;   TEE WITHOUT CARDIOVERSION N/A 07/01/2021   Procedure:  TRANSESOPHAGEAL ECHOCARDIOGRAM (TEE);  Surgeon: Chilton Si, MD;  Location[facility] ENDOSCOPY;  Service: Cardiovascular;  Laterality: N/A;   TEE WITHOUT CARDIOVERSION N/A 03/23/2023   Procedure: TRANSESOPHAGEAL ECHOCARDIOGRAM;  Surgeon: Lanier Prude, MD;  Location: [facility] INVASIVE CV LAB;  Service: Cardiovascular;  Laterality: N/A;   TENDON REPAIR Left 2000   knee   TONSILLECTOMY     TOTAL KNEE ARTHROPLASTY Left 06/08/2015   Procedure: TOTAL KNEE ARTHROPLASTY;  Surgeon: Ollen Gross, MD;  Location: [facility] ORS;  Service: Orthopedics;  Laterality: Left;   TOTAL KNEE ARTHROPLASTY Right 11/09/2015   Procedure: RIGHT TOTAL KNEE ARTHROPLASTY;  Surgeon: Ollen Gross, MD;  Location: [facility] ORS;  Service: Orthopedics;  Laterality: Right;    WRIST SURGERY  2008   left    Current Medications: Current Meds  Medication Sig   acetaminophen (TYLENOL) 650 MG CR tablet Take 650 mg by mouth 2 (two) times daily as needed for pain.   amLODipine (NORVASC) 5 MG tablet Take 5 mg by mouth daily.   amoxicillin (AMOXIL) 500 MG tablet Take 4 tablets by mouth 1 hour before any dental procedures.   atorvastatin (LIPITOR) 40 MG tablet Take 40 mg by mouth daily.   Cholecalciferol 25 MCG (1000 UT) TBDP Take 1,000 Units by mouth daily.   [START ON 05/04/2023] clopidogrel (PLAVIX) 75 MG tablet Take 1 tablet (75 mg total) by mouth daily.   empagliflozin (JARDIANCE) 25 MG TABS tablet Take 12.5 mg by mouth daily.   ipratropium (ATROVENT) 0.03 % nasal spray Place 2 sprays into both nostrils 2 (two) times daily.   metoprolol succinate (TOPROL-XL) 50 MG 24 hr tablet TAKE 1 TABLET BY MOUTH DAILY. TAKE WITH OR IMMEDIATELY FOLLOWING A MEAL.   Multiple Vitamin (MULTI-VITAMIN) tablet Take 1 tablet by mouth daily.   Multiple Vitamins-Minerals (PRESERVISION AREDS 2) CAPS Take 1 capsule by mouth 2 (two) times daily.   sacubitril-valsartan (ENTRESTO) 24-26 MG Take 1 tablet by mouth 2 (two) times daily.   [DISCONTINUED] apixaban (ELIQUIS) 5 MG TABS tablet Take 1 tablet (5 mg total) by mouth 2 (two) times daily.     Allergies:   Fluticasone   Social History   Socioeconomic History   Marital status: Married    Spouse name: Not on file   Number of children: Not on file   Years of education: Not on file   Highest education level: Not on file  Occupational History   Not on file  Tobacco Use   Smoking status: Never   Smokeless tobacco: Former    Quit date: 10/29/1992  Substance and Sexual Activity   Alcohol use: No   Drug use: No   Sexual activity: Not on file  Other Topics Concern   Not on file  Social History Narrative   Not on file   Social Determinants of Health   Financial Resource Strain: Not on file  Food Insecurity: Not on file  Transportation  Needs: Not on file  Physical Activity: Not on file  Stress: Not on file  Social Connections: Not on file    Family History: The patient's family history includes Brain cancer in his paternal uncle; Diabetes type II in his sister; Heart attack in his maternal uncle; Hypertension in his father and mother; Kidney disease in an other family member.  ROS:   Please see the history of present illness.    All other systems reviewed and are negative.  EKGs/Labs/Other Studies Reviewed:    The following studies were reviewed today:   Cardiac Studies &  Procedures     STRESS TESTS  MYOCARDIAL PERFUSION IMAGING 01/21/2022  Narrative   Lexiscan stress EKG showed no changes in ST segments from baseline   Occasional PVCs occurred pre, during and post procedure.  Couplets present in postinfusion period.   Myoview scan shows normal perfusion.  No ischemia or infarct.   LV is mildly dilated and LVEF is 51% with normal wall motion.    Consider alternate imaging to further define LVEF   Overall low risk study   ECHOCARDIOGRAM  ECHOCARDIOGRAM COMPLETE 02/20/2023  Narrative ECHOCARDIOGRAM REPORT    Patient Name:   Jeffrey SWANER Date of Exam: 02/20/2023 Medical Rec #:  [id number]     Height:       68.0 in Accession #:    [id number]    Weight:       220.4 lb Date of Birth:  21-Dec-1945     BSA:          2.130 m Patient Age:    76 years      BP:           132/86 mmHg Patient Gender: M             HR:           74 bpm. Exam Location:  [address]  Procedure: 2D Echo, Cardiac Doppler and Color Doppler  Indications:    I49.3 PVC's.  History:        Patient has prior history of Echocardiogram examinations, most recent 11/02/2021. Signs/Symptoms:Murmur; Risk Factors:Hypertension and Sleep Apnea.  Sonographer:    Sedonia Small Rodgers-Jones RDCS Referring Phys: [id number] CHRISTOPHER L SCHUMANN  IMPRESSIONS   1. Left ventricular ejection fraction, by estimation, is 55 to 60%. The left ventricle has  normal function. The left ventricle has no regional wall motion abnormalities. Left ventricular diastolic parameters are consistent with Grade I diastolic dysfunction (impaired relaxation). 2. Right ventricular systolic function is normal. The right ventricular size is normal. 3. The mitral valve is normal in structure. Mild mitral valve regurgitation. No evidence of mitral stenosis. 4. The aortic valve is tricuspid. Aortic valve regurgitation is not visualized. No aortic stenosis is present. 5. Aortic dilatation noted. There is borderline dilatation of the aortic root, measuring 39 mm. There is mild dilatation of the ascending aorta, measuring 41 mm. 6. The inferior vena cava is normal in size with greater than 50% respiratory variability, suggesting right atrial pressure of 3 mmHg.  FINDINGS Left Ventricle: Left ventricular ejection fraction, by estimation, is 55 to 60%. The left ventricle has normal function. The left ventricle has no regional wall motion abnormalities. The left ventricular internal cavity size was normal in size. There is no left ventricular hypertrophy. Left ventricular diastolic parameters are consistent with Grade I diastolic dysfunction (impaired relaxation).  Right Ventricle: The right ventricular size is normal. Right ventricular systolic function is normal.  Left Atrium: Left atrial size was normal in size.  Right Atrium: Right atrial size was normal in size.  Pericardium: There is no evidence of pericardial effusion.  Mitral Valve: The mitral valve is normal in structure. Mild mitral valve regurgitation. No evidence of mitral valve stenosis.  Tricuspid Valve: The tricuspid valve is normal in structure. Tricuspid valve regurgitation is trivial. No evidence of tricuspid stenosis.  Aortic Valve: The aortic valve is tricuspid. Aortic valve regurgitation is not visualized. No aortic stenosis is present.  Pulmonic Valve: The pulmonic valve was normal in structure.  Pulmonic valve regurgitation is not visualized. No evidence of  pulmonic stenosis.  Aorta: Aortic dilatation noted. There is borderline dilatation of the aortic root, measuring 39 mm. There is mild dilatation of the ascending aorta, measuring 41 mm.  Venous: The inferior vena cava is normal in size with greater than 50% respiratory variability, suggesting right atrial pressure of 3 mmHg.  IAS/Shunts: No atrial level shunt detected by color flow Doppler.   LEFT VENTRICLE PLAX 2D LVIDd:         5.00 cm   Diastology LVIDs:         3.70 cm   LV e' medial:    6.85 cm/s LV PW:         1.00 cm   LV E/e' medial:  16.5 LV IVS:        0.80 cm   LV e' lateral:   8.81 cm/s LVOT diam:     2.20 cm   LV E/e' lateral: 12.8 LV SV:         67 LV SV Index:   32 LVOT Area:     3.80 cm   RIGHT VENTRICLE             IVC RV Basal diam:  4.00 cm     IVC diam: 1.80 cm RV S prime:     14.35 cm/s TAPSE (M-mode): 2.6 cm  LEFT ATRIUM             Index        RIGHT ATRIUM           Index LA diam:        6.10 cm 2.86 cm/m   RA Area:     13.10 cm LA Vol (A2C):   79.3 ml 37.23 ml/m  RA Volume:   30.70 ml  14.41 ml/m LA Vol (A4C):   55.3 ml 25.96 ml/m LA Biplane Vol: 67.3 ml 31.60 ml/m AORTIC VALVE LVOT Vmax:   75.55 cm/s LVOT Vmean:  50.950 cm/s LVOT VTI:    0.178 m  AORTA Ao Root diam: 3.90 cm Ao Asc diam:  4.10 cm  MITRAL VALVE MV Area (PHT): 3.53 cm     SHUNTS MV Decel Time: 215 msec     Systemic VTI:  0.18 m MV E velocity: 113.00 cm/s  Systemic Diam: 2.20 cm MV A velocity: 102.00 cm/s MV E/A ratio:  1.11  Olga Millers MD Electronically signed by Olga Millers MD Signature Date/Time: 02/20/2023/11:46:03 AM    Final   TEE  ECHO TEE 03/23/2023  Narrative TRANSESOPHOGEAL ECHO REPORT    Patient Name:   THEOPHIL Andrews Date of Exam: 03/23/2023 Medical Rec #:  [id number]     Height:       68.0 in Accession #:    [id number]    Weight:       213.0 lb Date of Birth:  [birth year]      BSA:          2.099 m Patient Age:    76 years      BP:           168/83 mmHg Patient Gender: M             HR:           70 bpm. Exam Location:  Inpatient  Procedure: Transesophageal Echo, 3D Echo, Color Doppler and Cardiac Doppler  Indications:     I48.1 Persistent atrial fibrillation  History:         Patient has prior history of Echocardiogram examinations, most recent 02/20/2023.  Arrythmias:Atrial Fibrillation; Risk Factors:Diabetes, Hypertension and Sleep Apnea.  Sonographer:     Irving Burton Senior RDCS Referring Phys:  [id number] Lanier Prude Diagnosing Phys: Riley Lam MD  PROCEDURE: After discussion of the risks and benefits of a TEE, an informed consent was obtained from the patient. The transesophogeal probe was passed without difficulty through the esophogus of the patient. Sedation performed by different physician. The patient was monitored while under deep sedation. The patient developed no complications during the procedure.  IMPRESSIONS   1. Left atrial appendage is patent. Maximum ostial dimesion 24.3 mm. A more distal landing zone had maximum ostial dimension of 17 mm. 3D landing zone dimension 17 mm. Thin portion of the interatrial septum is relatively thick. A mid-mid transeptal puncture was performed without incidence. A 24 mm Watchman FLX was placed No mitral shoulder. No peri-device leak. No thrombus. Average compression ~ 23%. Left atrial size was mildly dilated. No left atrial/left atrial appendage thrombus was detected. 2. Left ventricular ejection fraction, by estimation, is 55%. The left ventricle has low normal function. There is moderate concentric left ventricular hypertrophy. 3. Right ventricular systolic function is normal. The right ventricular size is normal. 4. Right atrial size was mildly dilated. 5. The mitral valve is grossly normal. No evidence of mitral valve regurgitation. 6. The aortic valve is tricuspid. Aortic valve regurgitation is  trivial. No aortic stenosis is present. 7. There is mild (Grade II) plaque involving the ascending aorta. 8. Moderately dilated pulmonary artery.  FINDINGS Left Ventricle: Left ventricular ejection fraction, by estimation, is 55%. The left ventricle has low normal function. The left ventricular internal cavity size was normal in size. There is moderate concentric left ventricular hypertrophy.  Right Ventricle: The right ventricular size is normal. No increase in right ventricular wall thickness. Right ventricular systolic function is normal.  Left Atrium: Left atrial appendage is patent. Maximum ostial dimesion 24.3 mm. A more distal landing zone had maximum ostial dimension of 17 mm. 3D landing zone dimension 17 mm. Thin portion of the interatrial septum is relatively thick. A mid-mid transeptal puncture was performed without incidence. A 24 mm Watchman FLX was placed No mitral shoulder. No peri-device leak. No thrombus. Average compression ~ 23%. Left atrial size was mildly dilated. No left atrial/left atrial appendage thrombus was detected.  Right Atrium: Right atrial size was mildly dilated.  Pericardium: Trivial pericardial effusion is present. The pericardial effusion is anterior to the right ventricle.  Mitral Valve: Billowing of both mitral valve leaflets. The mitral valve is grossly normal. No evidence of mitral valve regurgitation.  Tricuspid Valve: The tricuspid valve is normal in structure. Tricuspid valve regurgitation is not demonstrated. No evidence of tricuspid stenosis.  Aortic Valve: The aortic valve is tricuspid. Aortic valve regurgitation is trivial. No aortic stenosis is present.  Pulmonic Valve: The pulmonic valve was normal in structure. Pulmonic valve regurgitation is not visualized. No evidence of pulmonic stenosis.  Aorta: The aortic root, ascending aorta, aortic arch and descending aorta are all structurally normal, with no evidence of dilitation or obstruction.  There is mild (Grade II) plaque involving the ascending aorta.  Pulmonary Artery: The pulmonary artery is moderately dilated.  IAS/Shunts: No atrial level shunt detected by color flow Doppler.  Additional Comments: Spectral Doppler performed.  Riley Lam MD Electronically signed by Riley Lam MD Signature Date/Time: 03/23/2023/3:32:49 PM    Final   MONITORS  LONG TERM MONITOR (3-14 DAYS) 02/20/2023  Narrative   37 episodes of NSVT, longest lasting 6 beats  6 episodes of SVT, longest lasting 16 beats   Frequent PVCs (21% of beats) and ventricular couplets (6.1% of beats)   Patch Wear Time:  2 days and 22 hours (2024-09-11T14:29:43-0400 to 2024-09-14T13:20:38-0400)  Patient had a min HR of 40 bpm, max HR of 176 bpm, and avg HR of 69 bpm. Predominant underlying rhythm was Sinus Rhythm. 37 Ventricular Tachycardia runs occurred, the run with the fastest interval lasting 4 beats with a max rate of 176 bpm, the longest lasting 6 beats with an avg rate of 131 bpm. 6 Supraventricular Tachycardia runs occurred, the run with the fastest interval lasting 4 beats with a max rate of 164 bpm, the longest lasting 16 beats with an avg rate of 123 bpm. Isolated SVEs were rare (<1.0%), SVE Couplets were rare (<1.0%), and SVE Triplets were rare (<1.0%). Isolated VEs were frequent (20.6%, [id number]), VE Couplets were frequent (6.1%, 9017), and VE Triplets were rare (<1.0%, 345). Ventricular Bigeminy and Trigeminy were present.           EKG:  EKG is not ordered today.    Recent Labs: 11/17/2022: ALT 16; TSH 1.450 02/06/2023: Magnesium 2.1 03/16/2023: Hemoglobin 15.1; Platelets 213 03/24/2023: BUN 9; Creatinine, Ser 0.75; Potassium 3.6; Sodium 136   Recent Lipid Panel No results found for: "CHOL", "TRIG", "HDL", "CHOLHDL", "VLDL", "LDLCALC", "LDLDIRECT"  Risk Assessment/Calculations:    HAS-BLED score 2 Hypertension Yes  Abnormal renal and liver function (Dialysis, transplant,  Cr >2.26 mg/dL /Cirrhosis or Bilirubin >2x Normal or AST/ALT/AP >3x Normal) No  Stroke No  Bleeding No  Labile INR (Unstable/high INR) No  Elderly (>65) Yes  Drugs or alcohol (>= 8 drinks/week, anti-plt or NSAID) No    CHA2DS2-VASc Score = 5  The patient's score is based upon: CHF History: 0 HTN History: 1 Diabetes History: 1 Stroke History: 0 Vascular Disease History: 1 Age Score: 2 Gender Score: 0  Physical Exam:    VS:  BP (!) 148/82   Pulse 63   Ht 5\' 8"  (1.727 m)   Wt 222 lb 12.8 oz (101.1 kg)   SpO2 97%   BMI 33.88 kg/m     Wt Readings from Last 3 Encounters:  05/03/23 222 lb 12.8 oz (101.1 kg)  03/23/23 213 lb (96.6 kg)  03/15/23 218 lb (98.9 kg)    General: Well developed, well nourished, NAD Lungs:Clear to ausculation bilaterally. No wheezes, rales, or rhonchi. Breathing is unlabored. Cardiovascular: RRR with S1 S2. No murmurs Extremities: No edema.  Neuro: Alert and oriented. No focal deficits. No facial asymmetry. MAE spontaneously. Psych: Responds to questions appropriately with normal affect.    ASSESSMENT/PLAN:    Persistent atrial fibrillation: s/p ablation 11/2022 and wished to pursue LAAO and is now s/p Watchman implant with 24mm FLX Pro device. He is now >45 days post implant and can stop Eliquis after second dose this evening. Tomorrow he will start Plavix 75mg  daily to complete 6 months post Watchman therapy (08/2023). He will additionally require dental SBE with Amoxicillin during that time. This was dicussed and provided today. Post procedure CT instructions reviewed with understanding. Obtain BMET today. Plan follow up 08/2023 with our team.   HTN: Slightly elevated above baseline however reports home BPs stable with SBPs in the 120 range. No changes needed today.   Chronic systolic CHF: Echocardiogram with LVEF at 35-40% with normalization to 55-60% on last imaging 01/2023. Appears euvolemic on exam today. Continue Entresto 24-26, Toprol XL. Jardiance.     CAD by CT: Denies  anginal symptoms. Plan for indefinite ASA after 6 months post implant Plavix.    I spent 20 minutes caring for this patient today including face-to-face discussions, ordering and reviewing labs, reviewing records from [facility] and other outside facilities, documenting in the record, and arranging for follow up.      Medication Adjustments/Labs and Tests Ordered: Current medicines are reviewed at length with the patient today.  Concerns regarding medicines are outlined above.  Orders Placed This Encounter  Procedures   Basic Metabolic Panel (BMET)   Meds ordered this encounter  Medications   clopidogrel (PLAVIX) 75 MG tablet    Sig: Take 1 tablet (75 mg total) by mouth daily.    Dispense:  90 tablet    Refill:  3   amoxicillin (AMOXIL) 500 MG tablet    Sig: Take 4 tablets by mouth 1 hour before any dental procedures.    Dispense:  4 tablet    Refill:  2    Patient Instructions  Medication Instructions:  Your physician has recommended you make the following change in your medication:  1-STOP Eliquis after evening dose today. 2-START Plavix 75 mg by mouth daily tomorrow 3-TAKE Amoxicillin 2 grams by mouth 1 hour before any dental procedures  *If you need a refill on your cardiac medications before your next appointment, please call your pharmacy*  Lab Work: Your physician recommends that you have lab work today- BMET If you have labs (blood work) drawn today and your tests are completely normal, you will receive your results only by: [employer] (if you have MyChart) OR A paper copy in the mail If you have any lab test that is abnormal or we need to change your treatment, we will call you to review the results.  Follow-Up: At [facility], you and your health needs are our priority.  As part of our continuing mission to provide you with exceptional heart care, we have created designated Provider Care Teams.  These Care Teams include your  primary Cardiologist (physician) and Advanced Practice Providers (APPs -  Physician Assistants and Nurse Practitioners) who all work together to provide you with the care you need, when you need it.  We recommend signing up for the patient portal called "MyChart".  Sign up information is provided on this After Visit Summary.  MyChart is used to connect with patients for Virtual Visits (Telemedicine).  Patients are able to view lab/test results, encounter notes, upcoming appointments, etc.  Non-urgent messages can be sent to your provider as well.   To learn more about what you can do with MyChart, go to [email or web address].    Your next appointment:   Already scheduled  Provider:   Georgie Chard, NP      Signed, Georgie Chard, NP  05/03/2023 12:16 PM    [facility]

## 2023-05-03 ENCOUNTER — Ambulatory Visit: Payer: Medicare Other | Attending: Internal Medicine | Admitting: Cardiology

## 2023-05-03 VITALS — BP 148/82 | HR 63 | Ht 68.0 in | Wt 222.8 lb

## 2023-05-03 DIAGNOSIS — I2510 Atherosclerotic heart disease of native coronary artery without angina pectoris: Secondary | ICD-10-CM

## 2023-05-03 DIAGNOSIS — Z95818 Presence of other cardiac implants and grafts: Secondary | ICD-10-CM | POA: Diagnosis not present

## 2023-05-03 DIAGNOSIS — I10 Essential (primary) hypertension: Secondary | ICD-10-CM

## 2023-05-03 DIAGNOSIS — I4819 Other persistent atrial fibrillation: Secondary | ICD-10-CM | POA: Diagnosis not present

## 2023-05-03 DIAGNOSIS — I5042 Chronic combined systolic (congestive) and diastolic (congestive) heart failure: Secondary | ICD-10-CM | POA: Diagnosis not present

## 2023-05-03 MED ORDER — CLOPIDOGREL BISULFATE 75 MG PO TABS: 75.0000 mg | ORAL_TABLET | Freq: Every day | ORAL | 3 refills | Status: DC

## 2023-05-03 MED ORDER — AMOXICILLIN 500 MG PO TABS: ORAL_TABLET | ORAL | 2 refills | Status: DC

## 2023-05-03 NOTE — Patient Instructions (Signed)
Medication Instructions:  Your physician has recommended you make the following change in your medication:  1-STOP Eliquis after evening dose today. 2-START Plavix 75 mg by mouth daily tomorrow 3-TAKE Amoxicillin 2 grams by mouth 1 hour before any dental procedures  *If you need a refill on your cardiac medications before your next appointment, please call your pharmacy*  Lab Work: Your physician recommends that you have lab work today- BMET If you have labs (blood work) drawn today and your tests are completely normal, you will receive your results only by: [employer] (if you have MyChart) OR A paper copy in the mail If you have any lab test that is abnormal or we need to change your treatment, we will call you to review the results.  Follow-Up: At [facility], you and your health needs are our priority.  As part of our continuing mission to provide you with exceptional heart care, we have created designated Provider Care Teams.  These Care Teams include your primary Cardiologist (physician) and Advanced Practice Providers (APPs -  Physician Assistants and Nurse Practitioners) who all work together to provide you with the care you need, when you need it.  We recommend signing up for the patient portal called "MyChart".  Sign up information is provided on this After Visit Summary.  MyChart is used to connect with patients for Virtual Visits (Telemedicine).  Patients are able to view lab/test results, encounter notes, upcoming appointments, etc.  Non-urgent messages can be sent to your provider as well.   To learn more about what you can do with MyChart, go to [email or web address].    Your next appointment:   Already scheduled  Provider:   Georgie Chard, NP

## 2023-05-04 LAB — BASIC METABOLIC PANEL
BUN/Creatinine Ratio: 18 (ref 10–24)
BUN: 14 mg/dL (ref 8–27)
CO2: 26 mmol/L (ref 20–29)
Calcium: 9.1 mg/dL (ref 8.6–10.2)
Chloride: 102 mmol/L (ref 96–106)
Creatinine, Ser: 0.79 mg/dL (ref 0.76–1.27)
Glucose: 124 mg/dL — ABNORMAL HIGH (ref 70–99)
Potassium: 4.2 mmol/L (ref 3.5–5.2)
Sodium: 141 mmol/L (ref 134–144)
eGFR: 91 mL/min/{1.73_m2} (ref >59)

## 2023-05-23 ENCOUNTER — Telehealth (HOSPITAL_COMMUNITY): Payer: Self-pay | Admitting: *Deleted

## 2023-05-23 NOTE — Telephone Encounter (Signed)
Reaching out to patient to offer assistance regarding upcoming cardiac imaging study; pt verbalizes understanding of appt date/time, parking situation and where to check in, pre-test NPO status, and verified current allergies; name and call back number provided for further questions should they arise  Merle Prescott RN Navigator Cardiac Imaging [facility] [phone] office [phone] cell  

## 2023-05-23 NOTE — Telephone Encounter (Signed)
Attempted to call patient regarding upcoming cardiac CT appointment. °Left message on voicemail with name and callback number ° °Edie Vallandingham RN Navigator Cardiac Imaging °[facility] °[phone] Office °[phone] Cell ° °

## 2023-05-25 ENCOUNTER — Ambulatory Visit (HOSPITAL_COMMUNITY)
Admission: RE | Admit: 2023-05-25 | Discharge: 2023-05-25 | Disposition: A | Discharge status: Home / Self Care | Payer: Medicare Other | Source: Ambulatory Visit | Attending: Cardiology | Admitting: Cardiology

## 2023-05-25 DIAGNOSIS — Z95818 Presence of other cardiac implants and grafts: Secondary | ICD-10-CM | POA: Diagnosis not present

## 2023-05-25 DIAGNOSIS — Z452 Encounter for adjustment and management of vascular access device: Secondary | ICD-10-CM | POA: Diagnosis not present

## 2023-05-25 DIAGNOSIS — I4819 Other persistent atrial fibrillation: Secondary | ICD-10-CM | POA: Diagnosis not present

## 2023-05-25 DIAGNOSIS — I2510 Atherosclerotic heart disease of native coronary artery without angina pectoris: Secondary | ICD-10-CM | POA: Diagnosis not present

## 2023-05-25 MED ORDER — IOHEXOL 350 MG/ML SOLN
95.0000 mL | Freq: Once | INTRAVENOUS | Status: AC | PRN
Start: 2023-05-25 — End: 2023-05-25
  Administered 2023-05-25: 95 mL via INTRAVENOUS

## 2023-08-13 NOTE — Progress Notes (Unsigned)
 Cardiology Office Note:    Date:  08/14/2023   ID:  Jeffrey Andrews, DOB 1946-04-18, MRN [id number]  PCP:  Tenna Delaine, MD  Cardiologist:  Little Ishikawa, MD  Electrophysiologist:  Lanier Prude, MD   Referring MD: Tenna Delaine, MD   Chief Complaint  Patient presents with   Atrial Fibrillation    History of Present Illness:    Jeffrey Andrews is a 78 y.o. male with a hx of persistent atrial fibrillation, OSA, hypertension, heart failure with recovered ejection fraction who presents for follow-up.  He was admitted to [facility] 05/2021 with shortness of breath, found to be in atrial fibrillation with RVR.  Echocardiogram 06/28/2021 showed EF 35 to 40%.  Underwent successful TEE/DCCV with conversion to sinus rhythm.  He had recurrence of A-fib with RVR during admission and was started on amiodarone.  Repeat cardioversion scheduled for 08/02/2021 was canceled, as he converted back to sinus rhythm on amiodarone.  Repeat echocardiogram 11/02/2021 showed EF 50 to 55%, moderate LVH, grade 1 diastolic dysfunction, normal RV function, mild MR, mild dilatation of aortic root measuring 40 mm.  CT chest on 11/12/2021 showed no acute findings but noted to have extensive coronary artery calcifications.  Lexiscan Myoview on 01/21/2022 showed normal perfusion, EF 51%.  Echocardiogram 02/20/2023 showed EF 55 to 60%.  Since last clinic visit, he reports he has been doing okay.  Had recent gastrointestinal virus, lost 10 pounds.  Since this reports has had some pain with swallowing.  Otherwise denies any chest pain.  Denies any dyspnea, lightheadedness, syncope, lower extremity edema, or palpitations.   Wt Readings from Last 3 Encounters:  08/14/23 212 lb 3.2 oz (96.3 kg)  05/03/23 222 lb 12.8 oz (101.1 kg)  03/23/23 213 lb (96.6 kg)     Past Medical History:  Diagnosis Date   Arthritis    Cancer (HCC) 2014   skin cancer left arm   Excessive sweating    Glucose intolerance 06/27/2021    Heart murmur    "functional heart murmur"   History of kidney stones    History of skin cancer    Hypertension    Presence of Watchman left atrial appendage closure device 03/23/2023   24mm Watchman   Sleep apnea    uses c-pap    Past Surgical History:  Procedure Laterality Date   ATRIAL FIBRILLATION ABLATION N/A 12/09/2022   Procedure: ATRIAL FIBRILLATION ABLATION;  Surgeon: Lanier Prude, MD;  Location: [facility] INVASIVE CV LAB;  Service: Cardiovascular;  Laterality: N/A;   BACK SURGERY     x3 last back surgery 1984   CARDIOVERSION N/A 07/01/2021   Procedure: CARDIOVERSION;  Surgeon: Chilton Si, MD;  Location: [facility] ENDOSCOPY;  Service: Cardiovascular;  Laterality: N/A;   EYE SURGERY     cataracts with lens implants   LEFT ATRIAL APPENDAGE OCCLUSION N/A 03/23/2023   Procedure: LEFT ATRIAL APPENDAGE OCCLUSION;  Surgeon: Lanier Prude, MD;  Location: [facility] INVASIVE CV LAB;  Service: Cardiovascular;  Laterality: N/A;   STERIOD INJECTION Right 06/08/2015   Procedure: STEROID INJECTION;  Surgeon: Ollen Gross, MD;  Location: [facility] ORS;  Service: Orthopedics;  Laterality: Right;   TEE WITHOUT CARDIOVERSION N/A 07/01/2021   Procedure: TRANSESOPHAGEAL ECHOCARDIOGRAM (TEE);  Surgeon: Chilton Si, MD;  Location: [facility] ENDOSCOPY;  Service: Cardiovascular;  Laterality: N/A;   TEE WITHOUT CARDIOVERSION N/A 03/23/2023   Procedure: TRANSESOPHAGEAL ECHOCARDIOGRAM;  Surgeon: Lanier Prude, MD;  Location: [facility] INVASIVE CV LAB;  Service: Cardiovascular;  Laterality:  N/A;   TENDON REPAIR Left 2000   knee   TONSILLECTOMY     TOTAL KNEE ARTHROPLASTY Left 06/08/2015   Procedure: TOTAL KNEE ARTHROPLASTY;  Surgeon: Ollen Gross, MD;  Location: [facility] ORS;  Service: Orthopedics;  Laterality: Left;   TOTAL KNEE ARTHROPLASTY Right 11/09/2015   Procedure: RIGHT TOTAL KNEE ARTHROPLASTY;  Surgeon: Ollen Gross, MD;  Location: [facility] ORS;  Service: Orthopedics;  Laterality: Right;   WRIST SURGERY  2008   left     Current Medications: Current Meds  Medication Sig   acetaminophen (TYLENOL) 650 MG CR tablet Take 650 mg by mouth 2 (two) times daily as needed for pain.   amLODipine (NORVASC) 5 MG tablet Take 5 mg by mouth daily.   amoxicillin (AMOXIL) 500 MG tablet Take 4 tablets by mouth 1 hour before any dental procedures.   atorvastatin (LIPITOR) 40 MG tablet Take 40 mg by mouth daily.   Cholecalciferol 25 MCG (1000 UT) TBDP Take 1,000 Units by mouth daily.   clopidogrel (PLAVIX) 75 MG tablet Take 1 tablet (75 mg total) by mouth daily.   empagliflozin (JARDIANCE) 25 MG TABS tablet Take 12.5 mg by mouth daily.   ipratropium (ATROVENT) 0.03 % nasal spray Place 2 sprays into both nostrils 2 (two) times daily.   metoprolol succinate (TOPROL-XL) 50 MG 24 hr tablet TAKE 1 TABLET BY MOUTH DAILY. TAKE WITH OR IMMEDIATELY FOLLOWING A MEAL.   Multiple Vitamin (MULTI-VITAMIN) tablet Take 1 tablet by mouth daily.   Multiple Vitamins-Minerals (PRESERVISION AREDS 2) CAPS Take 1 capsule by mouth 2 (two) times daily.   primidone (MYSOLINE) 50 MG tablet Take 50 mg by mouth daily at 6 (six) AM.   sacubitril-valsartan (ENTRESTO) 24-26 MG Take 1 tablet by mouth 2 (two) times daily.     Allergies:   Fluticasone   Social History   Socioeconomic History   Marital status: Married    Spouse name: Not on file   Number of children: Not on file   Years of education: Not on file   Highest education level: Not on file  Occupational History   Not on file  Tobacco Use   Smoking status: Never   Smokeless tobacco: Former    Quit date: 10/29/1992  Substance and Sexual Activity   Alcohol use: No   Drug use: No   Sexual activity: Not on file  Other Topics Concern   Not on file  Social History Narrative   Not on file   Social Drivers of Health   Financial Resource Strain: Not on file  Food Insecurity: Not on file  Transportation Needs: Not on file  Physical Activity: Not on file  Stress: Not on file  Social  Connections: Not on file     Family History: The patient's family history includes Brain cancer in his paternal uncle; Diabetes type II in his sister; Heart attack in his maternal uncle; Hypertension in his father and mother; Kidney disease in an other family member.  ROS:   Please see the history of present illness.     All other systems reviewed and are negative.  EKGs/Labs/Other Studies Reviewed:    The following studies were reviewed today:  EKG:   08/14/2023: Normal sinus rhythm, rate 67 02/06/2023: Sinus rhythm, rate 64, PVCs 08/05/22:NST, PVC, rate 74, Qtc 481 04/06/2022: Normal sinus rhythm, PVCs, rate 69  01/19/2022: Normal sinus rhythm, rate 73, no ST abnormality  Recent Labs: 11/17/2022: ALT 16; TSH 1.450 02/06/2023: Magnesium 2.1 03/16/2023: Hemoglobin 15.1; Platelets 213 05/03/2023:  BUN 14; Creatinine, Ser 0.79; Potassium 4.2; Sodium 141  Recent Lipid Panel No results found for: "CHOL", "TRIG", "HDL", "CHOLHDL", "VLDL", "LDLCALC", "LDLDIRECT"  Physical Exam:    VS:  BP 110/66 (BP Location: Left Arm, Patient Position: Sitting)   Pulse 67   Ht 5\' 8"  (1.727 m)   Wt 212 lb 3.2 oz (96.3 kg)   SpO2 96%   BMI 32.26 kg/m     Wt Readings from Last 3 Encounters:  08/14/23 212 lb 3.2 oz (96.3 kg)  05/03/23 222 lb 12.8 oz (101.1 kg)  03/23/23 213 lb (96.6 kg)     GEN:  Well nourished, well developed in no acute distress HEENT: Normal NECK: No JVD; No carotid bruits CARDIAC: RRR, no murmurs, rubs, gallops RESPIRATORY:  Clear to auscultation without rales, wheezing or rhonchi  ABDOMEN: Soft, non-tender, non-distended MUSCULOSKELETAL:  No edema; No deformity  SKIN: Warm and dry NEUROLOGIC:  Alert and oriented x 3 PSYCHIATRIC:  Normal affect   ASSESSMENT:    1. Persistent atrial fibrillation (HCC)   2. Aortic dilatation (HCC)   3. Heart failure with recovered ejection fraction (HFrecEF) (HCC)   4. PVC's (premature ventricular contractions)   5. Coronary artery disease  involving native coronary artery of native heart without angina pectoris   6. Primary hypertension   7. Hyperlipidemia, unspecified hyperlipidemia type   8. Medication management   9. Dysphagia, unspecified type       PLAN:     Persistent atrial fibrillation: admitted to [facility] 05/2021 with shortness of breath, found to be in atrial fibrillation with RVR.  Echocardiogram 06/28/2021 showed EF 35 to 40%.  Underwent successful TEE/DCCV with convers TEE ion to sinus rhythm.  He had recurrence of A-fib with RVR during admission and was started on amiodarone.  Repeat cardioversion scheduled for 08/02/2021 was canceled, as he converted back to sinus rhythm on amiodarone.  Repeat echocardiogram 11/02/2021 showed EF 50 to 55%, moderate LVH, grade 1 diastolic dysfunction, normal RV function, mild MR, mild dilatation of aortic root measuring 40 mm. 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  Referred to EP.  Seen by Dr. Lalla Brothers, underwent ablation 12/09/2022.  Underwent Watchman 02/2023.  Plan Plavix x 6 months post watchman through 08/2023  Heart failure with recovered EF: Echocardiogram 06/28/2021 showed EF 35 to 40%.  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  With restoration of sinus rhythm, repeat echocardiogram 11/02/2021 showed improvement in EF to 50 to 55%. -Continue Entresto 24-26 mg twice daily -Continue Toprol-XL 25 mg daily -Continue Jardiance -Stopped spironolactone per patient request -Check BMET -Appears euvolemic.  On Lasix as needed if gains more than 3 pounds in 1 day or 5 pounds in 1 week  Frequent PVCs: Zio patch x 3 days 01/2023 showed 21% PVC burden, 37 episodes of NSVT with longest lasting 6 beats.  Follows with EP.  Continue metoprolol -PVC burden appears improved on EKG and exam in clinic today, no PVCs noted  CAD: Calcium score 436 (58th percentile) 11/2022.  Lexiscan Myoview on 01/21/2022 showed normal  perfusion, EF 51%.  Continue Plavix, statin  Hypertension: On Entresto, Toprol-XL, amlodipine.  Appears controlled  Hyperlipidemia: On atorvastatin 40 mg daily.  LDL 70 on 12/2021  Dilated aorta: Dilatation of ascending aorta measuring 41 mm on echocardiogram 01/2023, will monitor.  Plan repeat echocardiogram in 1 year (01/2024)  Dysphagia: Reports recently has had some pain with swallowing, recommend PCP follow-up and referral to GI if persisting  RTC in  6 months  Medication Adjustments/Labs and Tests Ordered: Current medicines are reviewed at length with the patient today.  Concerns regarding medicines are outlined above.  Orders Placed This Encounter  Procedures   CBC   Basic Metabolic Panel (BMET)   EKG 12-Lead   ECHOCARDIOGRAM COMPLETE   No orders of the defined types were placed in this encounter.   Patient Instructions  Medication Instructions:  NO changes *If you need a refill on your cardiac medications before your next appointment, please call your pharmacy*   Lab Work: CBC/ BMET today If you have labs (blood work) drawn today and your tests are completely normal, you will receive your results only by: MyChart Message (if you have MyChart) OR A paper copy in the mail If you have any lab test that is abnormal or we need to change your treatment, we will call you to review the results.   Testing/Procedures: In 6 months  Your physician has requested that you have an echocardiogram. Echocardiography is a painless test that uses sound waves to create images of your heart. It provides your doctor with information about the size and shape of your heart and how well your heart's chambers and valves are working. This procedure takes approximately one hour. There are no restrictions for this procedure. Please do NOT wear cologne, perfume, aftershave, or lotions (deodorant is allowed). Please arrive 15 minutes prior to your appointment time.    Follow-Up: At [facility], you and your health needs are our priority.  As part of our continuing mission to provide you with exceptional heart care, we have created designated Provider Care Teams.  These Care Teams include your primary Cardiologist (physician) and Advanced Practice Providers (APPs -  Physician Assistants and Nurse Practitioners) who all work together to provide you with the care you need, when you need it.  We recommend signing up for the patient portal called "MyChart".  Sign up information is provided on this After Visit Summary.  MyChart is used to connect with patients for Virtual Visits (Telemedicine).  Patients are able to view lab/test results, encounter notes, upcoming appointments, etc.  Non-urgent messages can be sent to your provider as well.   To learn more about what you can do with MyChart, go to [email or web address].    Your next appointment:   6 month(s)  Provider:   Little Ishikawa, MD     Other Instructions     Signed, Little Ishikawa, MD  08/14/2023 8:35 AM    [facility]

## 2023-08-14 ENCOUNTER — Ambulatory Visit: Payer: Medicare Other | Attending: Cardiology | Admitting: Cardiology

## 2023-08-14 ENCOUNTER — Encounter: Payer: Self-pay | Admitting: Cardiology

## 2023-08-14 VITALS — BP 110/66 | HR 67 | Ht 68.0 in | Wt 212.2 lb

## 2023-08-14 DIAGNOSIS — I2510 Atherosclerotic heart disease of native coronary artery without angina pectoris: Secondary | ICD-10-CM | POA: Diagnosis not present

## 2023-08-14 DIAGNOSIS — E785 Hyperlipidemia, unspecified: Secondary | ICD-10-CM | POA: Diagnosis not present

## 2023-08-14 DIAGNOSIS — I5020 Unspecified systolic (congestive) heart failure: Secondary | ICD-10-CM

## 2023-08-14 DIAGNOSIS — I77819 Aortic ectasia, unspecified site: Secondary | ICD-10-CM | POA: Diagnosis not present

## 2023-08-14 DIAGNOSIS — Z79899 Other long term (current) drug therapy: Secondary | ICD-10-CM

## 2023-08-14 DIAGNOSIS — I10 Essential (primary) hypertension: Secondary | ICD-10-CM | POA: Diagnosis not present

## 2023-08-14 DIAGNOSIS — I5032 Chronic diastolic (congestive) heart failure: Secondary | ICD-10-CM

## 2023-08-14 DIAGNOSIS — R1310 Dysphagia, unspecified: Secondary | ICD-10-CM | POA: Diagnosis not present

## 2023-08-14 DIAGNOSIS — I4819 Other persistent atrial fibrillation: Secondary | ICD-10-CM | POA: Diagnosis not present

## 2023-08-14 DIAGNOSIS — I493 Ventricular premature depolarization: Secondary | ICD-10-CM | POA: Diagnosis not present

## 2023-08-14 LAB — CBC

## 2023-08-14 NOTE — Patient Instructions (Signed)
 Medication Instructions:  NO changes *If you need a refill on your cardiac medications before your next appointment, please call your pharmacy*   Lab Work: CBC/ BMET today If you have labs (blood work) drawn today and your tests are completely normal, you will receive your results only by: MyChart Message (if you have MyChart) OR A paper copy in the mail If you have any lab test that is abnormal or we need to change your treatment, we will call you to review the results.   Testing/Procedures: In 6 months  Your physician has requested that you have an echocardiogram. Echocardiography is a painless test that uses sound waves to create images of your heart. It provides your doctor with information about the size and shape of your heart and how well your heart's chambers and valves are working. This procedure takes approximately one hour. There are no restrictions for this procedure. Please do NOT wear cologne, perfume, aftershave, or lotions (deodorant is allowed). Please arrive 15 minutes prior to your appointment time.    Follow-Up: At [facility], you and your health needs are our priority.  As part of our continuing mission to provide you with exceptional heart care, we have created designated Provider Care Teams.  These Care Teams include your primary Cardiologist (physician) and Advanced Practice Providers (APPs -  Physician Assistants and Nurse Practitioners) who all work together to provide you with the care you need, when you need it.  We recommend signing up for the patient portal called "MyChart".  Sign up information is provided on this After Visit Summary.  MyChart is used to connect with patients for Virtual Visits (Telemedicine).  Patients are able to view lab/test results, encounter notes, upcoming appointments, etc.  Non-urgent messages can be sent to your provider as well.   To learn more about what you can do with MyChart, go to [email or web address].    Your  next appointment:   6 month(s)  Provider:   Little Ishikawa, MD     Other Instructions

## 2023-08-15 LAB — CBC
Hematocrit: 50.6 % (ref 37.5–51.0)
Hemoglobin: 17.3 g/dL (ref 13.0–17.7)
MCH: 33.5 pg — ABNORMAL HIGH (ref 26.6–33.0)
MCHC: 34.2 g/dL (ref 31.5–35.7)
MCV: 98 fL — ABNORMAL HIGH (ref 79–97)
Platelets: 181 10*3/uL (ref 150–450)
RBC: 5.16 x10E6/uL (ref 4.14–5.80)
RDW: 12.5 % (ref 11.6–15.4)
WBC: 6.2 10*3/uL (ref 3.4–10.8)

## 2023-08-15 LAB — BASIC METABOLIC PANEL
BUN/Creatinine Ratio: 19 (ref 10–24)
BUN: 16 mg/dL (ref 8–27)
CO2: 18 mmol/L — ABNORMAL LOW (ref 20–29)
Calcium: 8.8 mg/dL (ref 8.6–10.2)
Chloride: 101 mmol/L (ref 96–106)
Creatinine, Ser: 0.83 mg/dL (ref 0.76–1.27)
Glucose: 113 mg/dL — ABNORMAL HIGH (ref 70–99)
Potassium: 3.7 mmol/L (ref 3.5–5.2)
Sodium: 136 mmol/L (ref 134–144)
eGFR: 90 mL/min/{1.73_m2} (ref >59)

## 2023-08-16 ENCOUNTER — Encounter: Payer: Self-pay | Admitting: *Deleted

## 2023-09-04 ENCOUNTER — Ambulatory Visit: Payer: Medicare Other

## 2023-09-12 ENCOUNTER — Other Ambulatory Visit: Payer: Self-pay | Admitting: Cardiology

## 2023-10-05 NOTE — Progress Notes (Signed)
  Electrophysiology Office Note:   Date:  10/06/2023  ID:  Jeffrey Andrews, DOB Dec 27, 1945, MRN [id number]  Primary Cardiologist: Wendie Hamburg, MD Electrophysiologist: Boyce Byes, MD      History of Present Illness:   Jeffrey Andrews is a 78 y.o. male with h/o OSA, HTN, chronic systolic CHF, CAD by CT, HLD, and persistent atrial fibrillation s/p ablation and LAAO with Watchman seen today for routine electrophysiology followup.   Since last being seen in our clinic the patient reports doing well. Overall, he denies chest pain, palpitations, dyspnea, PND, orthopnea, nausea, vomiting, dizziness, syncope, edema, weight gain, or early satiety.   Review of systems complete and found to be negative unless listed in HPI.   EP Information / Studies Reviewed:    EKG is not ordered today. EKG from 08/14/2023 reviewed which showed NSR at 67 bpm       Arrhythmia/Device History S/p AF Ablation 12/09/2022 S/p Watchman 03/15/2023   Physical Exam:   VS:  BP (!) 142/74   Pulse 78   Ht 5\' 8"  (1.727 m)   Wt 219 lb 12.8 oz (99.7 kg)   SpO2 98%   BMI 33.42 kg/m    Wt Readings from Last 3 Encounters:  10/06/23 219 lb 12.8 oz (99.7 kg)  08/14/23 212 lb 3.2 oz (96.3 kg)  05/03/23 222 lb 12.8 oz (101.1 kg)     GEN: No acute distress NECK: No JVD; No carotid bruits CARDIAC: Regular rate and rhythm, no murmurs, rubs, gallops RESPIRATORY:  Clear to auscultation without rales, wheezing or rhonchi  ABDOMEN: Soft, non-tender, non-distended EXTREMITIES:  No edema; No deformity   ASSESSMENT AND PLAN:    Persistent AF S/p Ablation 11/2022 S/p Watchman 02/2023 Maintaining NSR Stop plavix . Start ASA 81 mg daily No further need for dental SBE from watchman perspective  HTN Stable on current regimen   HFrecEF EF 55-60% 01/2023  CAD by CT Plan for baby ASA lifetime after stopping plavix .    Follow up with EP APP in 6 months  Signed, Tylene Galla, PA-C

## 2023-10-06 ENCOUNTER — Encounter: Payer: Self-pay | Admitting: Student

## 2023-10-06 ENCOUNTER — Ambulatory Visit: Admitting: Student

## 2023-10-06 ENCOUNTER — Ambulatory Visit: Attending: Student | Admitting: Student

## 2023-10-06 VITALS — BP 142/74 | HR 78 | Ht 68.0 in | Wt 219.8 lb

## 2023-10-06 DIAGNOSIS — I10 Essential (primary) hypertension: Secondary | ICD-10-CM

## 2023-10-06 DIAGNOSIS — I493 Ventricular premature depolarization: Secondary | ICD-10-CM | POA: Diagnosis not present

## 2023-10-06 DIAGNOSIS — I2510 Atherosclerotic heart disease of native coronary artery without angina pectoris: Secondary | ICD-10-CM | POA: Diagnosis not present

## 2023-10-06 DIAGNOSIS — I4819 Other persistent atrial fibrillation: Secondary | ICD-10-CM | POA: Diagnosis not present

## 2023-10-06 MED ORDER — ASPIRIN 81 MG PO TBEC: 81.0000 mg | DELAYED_RELEASE_TABLET | Freq: Every day | ORAL | Status: AC

## 2023-10-06 NOTE — Patient Instructions (Signed)
 Medication Instructions:  Stop plavix  Start aspirin 81 mg daily *If you need a refill on your cardiac medications before your next appointment, please call your pharmacy*  Lab Work: None ordered If you have labs (blood work) drawn today and your tests are completely normal, you will receive your results only by: MyChart Message (if you have MyChart) OR A paper copy in the mail If you have any lab test that is abnormal or we need to change your treatment, we will call you to review the results.  Follow-Up: At [facility], you and your health needs are our priority.  As part of our continuing mission to provide you with exceptional heart care, our providers are all part of one team.  This team includes your primary Cardiologist (physician) and Advanced Practice Providers or APPs (Physician Assistants and Nurse Practitioners) who all work together to provide you with the care you need, when you need it.  Your next appointment:   6 month(s)  Provider:   You may see Boyce Byes, MD or one of the following Advanced Practice Providers on your designated Care Team:   Mertha Abrahams, [place] Joycelyn Noa" [place], [place] Creighton Doffing, NP

## 2023-11-16 DIAGNOSIS — H353132 Nonexudative age-related macular degeneration, bilateral, intermediate dry stage: Secondary | ICD-10-CM | POA: Diagnosis not present

## 2023-11-16 DIAGNOSIS — E119 Type 2 diabetes mellitus without complications: Secondary | ICD-10-CM | POA: Diagnosis not present

## 2023-11-16 DIAGNOSIS — H43811 Vitreous degeneration, right eye: Secondary | ICD-10-CM | POA: Diagnosis not present

## 2023-11-16 DIAGNOSIS — Z961 Presence of intraocular lens: Secondary | ICD-10-CM | POA: Diagnosis not present

## 2024-01-23 ENCOUNTER — Encounter: Payer: Self-pay | Admitting: Cardiology

## 2024-02-14 ENCOUNTER — Ambulatory Visit (HOSPITAL_COMMUNITY)
Admission: RE | Admit: 2024-02-14 | Discharge: 2024-02-14 | Disposition: A | Discharge status: Home / Self Care | Source: Ambulatory Visit | Attending: Cardiovascular Disease | Admitting: Cardiovascular Disease

## 2024-02-14 DIAGNOSIS — I340 Nonrheumatic mitral (valve) insufficiency: Secondary | ICD-10-CM | POA: Diagnosis not present

## 2024-02-14 DIAGNOSIS — I499 Cardiac arrhythmia, unspecified: Secondary | ICD-10-CM | POA: Diagnosis not present

## 2024-02-14 DIAGNOSIS — I10 Essential (primary) hypertension: Secondary | ICD-10-CM

## 2024-02-14 DIAGNOSIS — I77819 Aortic ectasia, unspecified site: Secondary | ICD-10-CM | POA: Insufficient documentation

## 2024-02-14 DIAGNOSIS — I509 Heart failure, unspecified: Secondary | ICD-10-CM | POA: Insufficient documentation

## 2024-02-14 DIAGNOSIS — I517 Cardiomegaly: Secondary | ICD-10-CM | POA: Insufficient documentation

## 2024-02-14 DIAGNOSIS — I77810 Thoracic aortic ectasia: Secondary | ICD-10-CM | POA: Insufficient documentation

## 2024-02-14 DIAGNOSIS — R011 Cardiac murmur, unspecified: Secondary | ICD-10-CM

## 2024-02-14 DIAGNOSIS — I4891 Unspecified atrial fibrillation: Secondary | ICD-10-CM | POA: Insufficient documentation

## 2024-02-14 LAB — ECHOCARDIOGRAM COMPLETE
Area-P 1/2: 1.9 cm2
S' Lateral: 3.4 cm

## 2024-02-15 ENCOUNTER — Ambulatory Visit: Payer: Self-pay | Admitting: Cardiology

## 2024-02-18 NOTE — Progress Notes (Unsigned)
 Cardiology Office Note:    Date:  02/21/2024   ID:  Jeffrey Andrews, DOB [birth year], MRN [id number]  PCP:  Jeffrey Particia SAILOR, MD  Cardiologist:  Jeffrey LITTIE Nanas, MD  Electrophysiologist:  Jeffrey ONEIDA HOLTS, MD   Referring MD: Jeffrey Particia SAILOR, MD   Chief Complaint  Patient presents with   Atrial Fibrillation    History of Present Illness:    Jeffrey Andrews is a 78 y.o. male with a hx of persistent atrial fibrillation, OSA, hypertension, heart failure with recovered ejection fraction who presents for follow-up.  He was admitted to [facility] 05/2021 with shortness of breath, found to be in atrial fibrillation with RVR.  Echocardiogram 06/28/2021 showed EF 35 to 40%.  Underwent successful TEE/DCCV with conversion to sinus rhythm.  He had recurrence of A-fib with RVR during admission and was started on amiodarone .  Repeat cardioversion scheduled for 08/02/2021 was canceled, as he converted back to sinus rhythm on amiodarone .  Repeat echocardiogram 11/02/2021 showed EF 50 to 55%, moderate LVH, grade 1 diastolic dysfunction, normal RV function, mild MR, mild dilatation of aortic root measuring 40 mm.  CT chest on 11/12/2021 showed no acute findings but noted to have extensive coronary artery calcifications.  Lexiscan  Myoview  on 01/21/2022 showed normal perfusion, EF 51%.  Echocardiogram 02/20/2023 showed EF 55 to 60%.  Echocardiogram 01/2024 showed EF 55 to 60%, normal RV function, moderate left atrial enlargement, dilated ascending aorta measuring 42 mm.  Since last clinic visit, he reports he is doing well.  Denies any chest pain, dyspnea, lightheadedness, syncope, lower extremity edema, or palpitations.  He has not been exercising.    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/23/2023   24mm Watchman   Sleep apnea    uses c-pap    Past Surgical History:  Procedure Laterality Date   ATRIAL FIBRILLATION ABLATION N/A 12/09/2022   Procedure: ATRIAL FIBRILLATION ABLATION;  Surgeon: Andrews Jeffrey ONEIDA, MD;  Location: [facility] INVASIVE CV LAB;  Service: Cardiovascular;  Laterality: N/A;   BACK SURGERY     x3 last back surgery 1984   CARDIOVERSION N/A 07/01/2021   Procedure: CARDIOVERSION;  Surgeon: Raford Riggs, MD;  Location: [facility] ENDOSCOPY;  Service: Cardiovascular;  Laterality: N/A;   EYE SURGERY     cataracts with lens implants   LEFT ATRIAL APPENDAGE OCCLUSION N/A 03/23/2023   Procedure: LEFT ATRIAL APPENDAGE OCCLUSION;  Surgeon: Andrews Jeffrey ONEIDA, MD;  Location: [facility] INVASIVE CV LAB;  Service: Cardiovascular;  Laterality: N/A;   STERIOD INJECTION Right 06/08/2015   Procedure: STEROID INJECTION;  Surgeon: Dempsey Moan, MD;  Location: [facility] ORS;  Service: Orthopedics;  Laterality: Right;   TEE WITHOUT CARDIOVERSION N/A 07/01/2021   Procedure: TRANSESOPHAGEAL ECHOCARDIOGRAM (TEE);  Surgeon: Raford Riggs, MD;  Location: [facility] ENDOSCOPY;  Service: Cardiovascular;  Laterality: N/A;   TEE WITHOUT CARDIOVERSION N/A 03/23/2023   Procedure: TRANSESOPHAGEAL ECHOCARDIOGRAM;  Surgeon: Andrews Jeffrey ONEIDA, MD;  Location: [facility] INVASIVE  CV LAB;  Service: Cardiovascular;  Laterality: N/A;   TENDON REPAIR Left 2000   knee   TONSILLECTOMY     TOTAL KNEE ARTHROPLASTY Left 06/08/2015   Procedure: TOTAL KNEE ARTHROPLASTY;  Surgeon: Dempsey Moan, MD;  Location: [facility] ORS;  Service: Orthopedics;  Laterality: Left;   TOTAL KNEE ARTHROPLASTY Right 11/09/2015   Procedure: RIGHT TOTAL KNEE ARTHROPLASTY;  Surgeon: Dempsey Moan, MD;  Location: [facility] ORS;  Service: Orthopedics;  Laterality: Right;    WRIST SURGERY  2008   left    Current Medications: Current Meds  Medication Sig   acetaminophen  (TYLENOL ) 650 MG CR tablet Take 650 mg by mouth 2 (two) times daily as needed for pain.   amLODipine  (NORVASC ) 5 MG tablet Take 5 mg by mouth daily.   aspirin  EC 81 MG tablet Take 1 tablet (81 mg total) by mouth daily. Swallow whole.   atorvastatin  (LIPITOR) 80 MG tablet Take 1 tablet (80 mg total) by mouth daily.   Cholecalciferol 25 MCG (1000 UT) TBDP Take 1,000 Units by mouth daily.   empagliflozin (JARDIANCE) 25 MG TABS tablet Take 12.5 mg by mouth daily.   loratadine  (CLARITIN ) 10 MG tablet Take 1 tablet by mouth daily.   metoprolol  succinate (TOPROL -XL) 50 MG 24 hr tablet TAKE 1 TABLET BY MOUTH DAILY. TAKE WITH OR IMMEDIATELY FOLLOWING A MEAL.   Multiple Vitamin (MULTI-VITAMIN) tablet Take 1 tablet by mouth daily.   Multiple Vitamins-Minerals (PRESERVISION AREDS 2) CAPS Take 1 capsule by mouth 2 (two) times daily.   primidone (MYSOLINE) 50 MG tablet Take 50 mg by mouth daily at 6 (six) AM.   sacubitril -valsartan  (ENTRESTO ) 24-26 MG Take 1 tablet by mouth 2 (two) times daily.   [DISCONTINUED] atorvastatin  (LIPITOR) 40 MG tablet Take 40 mg by mouth daily.     Allergies:   Fluticasone   Social History   Socioeconomic History   Marital status: Married    Spouse name: Not on file   Number of children: Not on file   Years of education: Not on file   Highest education level: Not on file  Occupational History   Not on file  Tobacco Use   Smoking status: Never   Smokeless tobacco: Former    Quit date: 10/29/1992  Substance and Sexual Activity   Alcohol use: No   Drug use: No   Sexual activity: Not on file  Other Topics Concern   Not on file  Social History Narrative   Not on file   Social Drivers of Health   Financial Resource Strain: Not on file  Food Insecurity: Not on file  Transportation Needs: Not on file  Physical Activity: Not on file  Stress: Not on file  Social  Connections: Not on file     Family History: The patient's family history includes Brain cancer in his paternal uncle; Diabetes type II in his sister; Heart attack in his maternal uncle; Hypertension in his father and mother; Kidney disease in an other family member.  ROS:   Please see the history of present illness.     All other systems reviewed and are negative.  EKGs/Labs/Other Studies Reviewed:    The following studies were reviewed today:  EKG:   02/21/2024: Sinus bradycardia, rate 56, poor R wave progression 08/14/2023: Normal sinus rhythm, rate 67 02/06/2023: Sinus rhythm, rate 64, PVCs 08/05/22:NST, PVC, rate 74, Qtc 481 04/06/2022: Normal sinus rhythm, PVCs, rate 69  01/19/2022: Normal sinus rhythm, rate 73, no ST abnormality  Recent Labs: 08/14/2023: BUN  16; Creatinine, Ser 0.83; Hemoglobin 17.3; Platelets 181; Potassium 3.7; Sodium 136  Recent Lipid Panel No results found for: CHOL, TRIG, HDL, CHOLHDL, VLDL, LDLCALC, LDLDIRECT  Physical Exam:    VS:  BP 124/68   Pulse (!) 56   Ht 5' 8 (1.727 m)   Wt 215 lb 6.4 oz (97.7 kg)   SpO2 99%   BMI 32.75 kg/m     Wt Readings from Last 3 Encounters:  02/21/24 215 lb 6.4 oz (97.7 kg)  10/06/23 219 lb 12.8 oz (99.7 kg)  08/14/23 212 lb 3.2 oz (96.3 kg)     GEN:  Well nourished, well developed in no acute distress HEENT: Normal NECK: No JVD; No carotid bruits CARDIAC: RRR, no murmurs, rubs, gallops RESPIRATORY:  Clear to auscultation without rales, wheezing or rhonchi  ABDOMEN: Soft, non-tender, non-distended MUSCULOSKELETAL:  No edema; No deformity  SKIN: Warm and dry NEUROLOGIC:  Alert and oriented x 3 PSYCHIATRIC:  Normal affect   ASSESSMENT:    1. Persistent atrial fibrillation (HCC)   2. Heart failure with recovered ejection fraction (HFrecEF) (HCC)   3. Essential hypertension   4. PVC's (premature ventricular contractions)   5. Coronary artery disease involving native coronary artery of native  heart without angina pectoris   6. Hyperlipidemia, unspecified hyperlipidemia type   7. Aortic dilatation        PLAN:     Persistent atrial fibrillation: admitted to [facility] 05/2021 with shortness of breath, found to be in atrial fibrillation with RVR.  Echocardiogram 06/28/2021 showed EF 35 to 40%.  Underwent successful TEE/DCCV with convers TEE ion to sinus rhythm.  He had recurrence of A-fib with RVR during admission and was started on amiodarone .  Repeat cardioversion scheduled for 08/02/2021 was canceled, as he converted back to sinus rhythm on amiodarone .  Repeat echocardiogram 11/02/2021 showed EF 50 to 55%, moderate LVH, grade 1 diastolic dysfunction, normal RV function, mild MR, mild dilatation of aortic root measuring 40 mm. 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  Referred to EP.  Seen by Dr. Cindie, underwent ablation 12/09/2022.  Underwent Watchman 02/2023.  Completed Plavix  x 6 months post watchman through 08/2023, now on ASA 81 mg daily  Heart failure with recovered EF: Echocardiogram 06/28/2021 showed EF 35 to 40%.  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  With restoration of sinus rhythm, repeat echocardiogram 11/02/2021 showed improvement in EF to 50 to 55%. -Continue Entresto  24-26 mg twice daily -Continue Toprol -XL 25 mg daily -Continue Jardiance -Stopped spironolactone  per patient request -Appears euvolemic. -Brought his recent labs from the [place], normal BMET  Frequent PVCs: Zio patch x 3 days 01/2023 showed 21% PVC burden, 37 episodes of NSVT with longest lasting 6 beats.  Follows with EP.  Continue metoprolol  -PVC burden appears improved on EKG and exam in clinic today, no PVCs noted  CAD: Calcium  score 436 (58th percentile) 11/2022.  Lexiscan  Myoview  on 01/21/2022 showed normal perfusion, EF 51%.  Continue ASA, statin  Hypertension: On Entresto , Toprol -XL, amlodipine .  Appears  controlled  Hyperlipidemia: On atorvastatin  40 mg daily.  LDL 90 on 01/09/24.  Will increase atorvastatin  80 mg daily for goal LDL less than 70.  Check fasting lipid panel in 3 months  Dilated aorta: Dilatation of ascending aorta measuring 41 mm on echocardiogram 01/2023, will monitor.  Measured 42 mm on echocardiogram 01/2024, will monitor   RTC in 6 months  Medication Adjustments/Labs and Tests Ordered: Current medicines are reviewed at length with  the patient today.  Concerns regarding medicines are outlined above.  Orders Placed This Encounter  Procedures   Lipid panel   EKG 12-Lead   Meds ordered this encounter  Medications   atorvastatin  (LIPITOR) 80 MG tablet    Sig: Take 1 tablet (80 mg total) by mouth daily.    Dispense:  90 tablet    Refill:  3    Patient Instructions  Medication Instructions:  Increase Atorvastatin  80mg  daily *If you need a refill on your cardiac medications before your next appointment, please call your pharmacy*  Lab Work: Lipid panel in 3 months If you have labs (blood work) drawn today and your tests are completely normal, you will receive your results only by: MyChart Message (if you have MyChart) OR A paper copy in the mail If you have any lab test that is abnormal or we need to change your treatment, we will call you to review the results.  Testing/Procedures: none  Follow-Up: At [facility], you and your health needs are our priority.  As part of our continuing mission to provide you with exceptional heart care, our providers are all part of one team.  This team includes your primary Cardiologist (physician) and Advanced Practice Providers or APPs (Physician Assistants and Nurse Practitioners) who all work together to provide you with the care you need, when you need it.  Your next appointment:   6 months  Provider:   Dr.Glendi Mohiuddin   We recommend signing up for the patient portal called MyChart.  Sign up information is  provided on this After Visit Summary.  MyChart is used to connect with patients for Virtual Visits (Telemedicine).  Patients are able to view lab/test results, encounter notes, upcoming appointments, etc.  Non-urgent messages can be sent to your provider as well.   To learn more about what you can do with MyChart, go to [email or web address].   Other Instructions none           Signed, Jeffrey LITTIE Nanas, MD  02/21/2024 9:36 AM    [facility]

## 2024-02-21 ENCOUNTER — Encounter: Payer: Self-pay | Admitting: Cardiology

## 2024-02-21 ENCOUNTER — Ambulatory Visit: Attending: Cardiology | Admitting: Cardiology

## 2024-02-21 VITALS — BP 124/68 | HR 56 | Ht 68.0 in | Wt 215.4 lb

## 2024-02-21 DIAGNOSIS — I2510 Atherosclerotic heart disease of native coronary artery without angina pectoris: Secondary | ICD-10-CM

## 2024-02-21 DIAGNOSIS — I77819 Aortic ectasia, unspecified site: Secondary | ICD-10-CM | POA: Diagnosis not present

## 2024-02-21 DIAGNOSIS — I5032 Chronic diastolic (congestive) heart failure: Secondary | ICD-10-CM

## 2024-02-21 DIAGNOSIS — E785 Hyperlipidemia, unspecified: Secondary | ICD-10-CM

## 2024-02-21 DIAGNOSIS — I10 Essential (primary) hypertension: Secondary | ICD-10-CM | POA: Diagnosis not present

## 2024-02-21 DIAGNOSIS — I493 Ventricular premature depolarization: Secondary | ICD-10-CM | POA: Diagnosis not present

## 2024-02-21 DIAGNOSIS — I4819 Other persistent atrial fibrillation: Secondary | ICD-10-CM | POA: Diagnosis not present

## 2024-02-21 MED ORDER — ATORVASTATIN CALCIUM 80 MG PO TABS
80.0000 mg | ORAL_TABLET | Freq: Every day | ORAL | 3 refills | Status: AC
Start: 2024-02-21 — End: ?

## 2024-02-21 NOTE — Patient Instructions (Signed)
 Medication Instructions:  Increase Atorvastatin  80mg  daily *If you need a refill on your cardiac medications before your next appointment, please call your pharmacy*  Lab Work: Lipid panel in 3 months If you have labs (blood work) drawn today and your tests are completely normal, you will receive your results only by: MyChart Message (if you have MyChart) OR A paper copy in the mail If you have any lab test that is abnormal or we need to change your treatment, we will call you to review the results.  Testing/Procedures: none  Follow-Up: At [facility], you and your health needs are our priority.  As part of our continuing mission to provide you with exceptional heart care, our providers are all part of one team.  This team includes your primary Cardiologist (physician) and Advanced Practice Providers or APPs (Physician Assistants and Nurse Practitioners) who all work together to provide you with the care you need, when you need it.  Your next appointment:   6 months  Provider:   Dr.Schumann   We recommend signing up for the patient portal called MyChart.  Sign up information is provided on this After Visit Summary.  MyChart is used to connect with patients for Virtual Visits (Telemedicine).  Patients are able to view lab/test results, encounter notes, upcoming appointments, etc.  Non-urgent messages can be sent to your provider as well.   To learn more about what you can do with MyChart, go to [email or web address].   Other Instructions none

## 2024-02-22 ENCOUNTER — Telehealth: Payer: Self-pay | Admitting: Cardiology

## 2024-02-22 NOTE — Telephone Encounter (Signed)
 Patient dropped off prescription this morning - I am placing in Dr. Alvan box later this morning.

## 2024-02-26 ENCOUNTER — Encounter: Payer: Self-pay | Admitting: *Deleted

## 2024-02-26 NOTE — Telephone Encounter (Signed)
 Called and made patient aware that prescription for Atorvastatin  80 mg has been faxed to [facility] at [phone] with Attn. Basrai.

## 2024-02-27 ENCOUNTER — Telehealth: Payer: Self-pay | Admitting: *Deleted

## 2024-02-27 NOTE — Telephone Encounter (Signed)
 Made patient aware that prescription was faxed to [facility]. Pt verbalized understanding

## 2024-03-19 NOTE — Progress Notes (Signed)
 NIRAJ KUDRNA                                          MRN: [id number]   03/19/2024   The VBCI Quality Team Specialist reviewed this patient medical record for the purposes of chart review for care gap closure. The following were reviewed: chart review for care gap closure-kidney health evaluation for diabetes:eGFR  and uACR.    VBCI Quality Team

## 2024-05-16 ENCOUNTER — Ambulatory Visit: Payer: Self-pay | Admitting: Cardiology

## 2024-05-16 LAB — LIPID PANEL
Chol/HDL Ratio: 3.1 ratio (ref 0.0–5.0)
Cholesterol, Total: 136 mg/dL (ref 100–199)
HDL: 44 mg/dL (ref >39)
LDL Chol Calc (NIH): 70 mg/dL (ref 0–99)
Triglycerides: 123 mg/dL (ref 0–149)
VLDL Cholesterol Cal: 22 mg/dL (ref 5–40)

## 2024-06-01 NOTE — Progress Notes (Unsigned)
" °  Electrophysiology Office Follow up Visit Note:    Date:  06/03/2024   ID:  Jeffrey Andrews, DOB 1945/11/04, MRN [id number]  PCP:  Jeffrey Particia SAILOR, MD  [facility] Cardiologist:  Lonni LITTIE Nanas, MD  [facility] Electrophysiologist:  OLE ONEIDA HOLTS, MD    Interval History:     Jeffrey Andrews is a 79 y.o. male who presents for a follow up visit.   He saw [name] 10/06/2023.  He has a history of OSA, HTN, chronic systolic HF, CAD, HLD and persistent AF s/p PVI and Watchman.  He is doing well.  No sustained recurrence of arrhythmia.       Past medical, surgical, social and family history were reviewed.  ROS:   Please see the history of present illness.    All other systems reviewed and are negative.  EKGs/Labs/Other Studies Reviewed:    The following studies were reviewed today:     EKG Interpretation Date/Time:  Monday June 03 2024 09:27:25 EST Ventricular Rate:  89 PR Interval:  190 QRS Duration:  86 QT Interval:  396 QTC Calculation: 481 R Axis:   19  Text Interpretation: Sinus rhythm with frequent Premature ventricular complexes Confirmed by Holts Ole [phone]) on 06/03/2024 9:29:33 AM    Physical Exam:    VS:  BP 124/67   Pulse 93   Ht 5' 8 (1.727 m)   Wt 218 lb (98.9 kg)   SpO2 97%   BMI 33.15 kg/m     Wt Readings from Last 3 Encounters:  06/03/24 218 lb (98.9 kg)  02/21/24 215 lb 6.4 oz (97.7 kg)  10/06/23 219 lb 12.8 oz (99.7 kg)     GEN: no distress CARD: RRR, No MRG RESP: No IWOB. CTAB.      ASSESSMENT:    1. Persistent atrial fibrillation (HCC)   2. Essential hypertension    PLAN:    In order of problems listed above:  #AF Doing well after PVI and watchman. No known recurrence of arrhythmia  Continue aspirin   #Hypertension At goal today.  Recommend checking blood pressures 1-2 times per week at home and recording the values.  Recommend bringing these recordings to the primary care physician.  I discussed  my upcoming departure from [facility] during today's clinic appointment.  The patient will continue to follow-up with one of my EP partners moving forward.  Follow up 1 year with APP.    Signed, Ole Holts, MD, [facility], [facility] 06/03/2024 9:32 AM    Electrophysiology [facility] "

## 2024-06-03 ENCOUNTER — Ambulatory Visit: Admitting: Cardiology

## 2024-06-03 ENCOUNTER — Encounter: Payer: Self-pay | Admitting: Cardiology

## 2024-06-03 VITALS — BP 124/67 | HR 93 | Ht 68.0 in | Wt 218.0 lb

## 2024-06-03 DIAGNOSIS — I10 Essential (primary) hypertension: Secondary | ICD-10-CM | POA: Diagnosis not present

## 2024-06-03 DIAGNOSIS — I4819 Other persistent atrial fibrillation: Secondary | ICD-10-CM | POA: Diagnosis not present

## 2024-06-03 NOTE — Patient Instructions (Signed)

## 2024-06-22 ENCOUNTER — Encounter: Payer: Self-pay | Admitting: Cardiology
